# Patient Record
Sex: Female | Born: 1955 | Race: White | Hispanic: No | Marital: Married | Smoking: Never smoker
Health system: Southern US, Community
[De-identification: ages and names within clinical notes are randomized; demographics above are authoritative.]

## PROBLEM LIST (undated history)

## (undated) DIAGNOSIS — N8110 Cystocele, unspecified: Secondary | ICD-10-CM

## (undated) DIAGNOSIS — E079 Disorder of thyroid, unspecified: Secondary | ICD-10-CM

## (undated) DIAGNOSIS — K449 Diaphragmatic hernia without obstruction or gangrene: Secondary | ICD-10-CM

## (undated) DIAGNOSIS — K219 Gastro-esophageal reflux disease without esophagitis: Secondary | ICD-10-CM

## (undated) DIAGNOSIS — I10 Essential (primary) hypertension: Secondary | ICD-10-CM

## (undated) DIAGNOSIS — J309 Allergic rhinitis, unspecified: Secondary | ICD-10-CM

## (undated) DIAGNOSIS — E785 Hyperlipidemia, unspecified: Secondary | ICD-10-CM

## (undated) HISTORY — DX: Essential (primary) hypertension: I10

## (undated) HISTORY — DX: Hyperlipidemia, unspecified: E78.5

## (undated) HISTORY — DX: Diaphragmatic hernia without obstruction or gangrene: K44.9

## (undated) HISTORY — DX: Cystocele, unspecified: N81.10

## (undated) HISTORY — DX: Disorder of thyroid, unspecified: E07.9

## (undated) HISTORY — DX: Allergic rhinitis, unspecified: J30.9

## (undated) HISTORY — DX: Gastro-esophageal reflux disease without esophagitis: K21.9

## (undated) HISTORY — PX: VEIN LIGATION AND STRIPPING: SHX2653

## (undated) HISTORY — PX: BREAST BIOPSY: SHX20

---

## 1998-10-26 HISTORY — PX: ABDOMINAL HYSTERECTOMY: SHX81

## 2010-03-18 ENCOUNTER — Ambulatory Visit: Payer: Self-pay | Admitting: Gastroenterology

## 2016-04-22 ENCOUNTER — Encounter: Payer: Self-pay | Admitting: Obstetrics and Gynecology

## 2016-04-22 ENCOUNTER — Ambulatory Visit (INDEPENDENT_AMBULATORY_CARE_PROVIDER_SITE_OTHER): Payer: BLUE CROSS/BLUE SHIELD | Admitting: Obstetrics and Gynecology

## 2016-04-22 VITALS — BP 145/75 | HR 99 | Ht 64.5 in | Wt 212.0 lb

## 2016-04-22 DIAGNOSIS — T8389XA Other specified complication of genitourinary prosthetic devices, implants and grafts, initial encounter: Secondary | ICD-10-CM

## 2016-04-22 DIAGNOSIS — N898 Other specified noninflammatory disorders of vagina: Secondary | ICD-10-CM | POA: Insufficient documentation

## 2016-04-22 DIAGNOSIS — Z515 Encounter for palliative care: Secondary | ICD-10-CM | POA: Insufficient documentation

## 2016-04-22 DIAGNOSIS — N393 Stress incontinence (female) (male): Secondary | ICD-10-CM | POA: Diagnosis not present

## 2016-04-22 DIAGNOSIS — N8110 Cystocele, unspecified: Secondary | ICD-10-CM | POA: Diagnosis not present

## 2016-04-22 DIAGNOSIS — IMO0002 Reserved for concepts with insufficient information to code with codable children: Secondary | ICD-10-CM | POA: Insufficient documentation

## 2016-04-22 DIAGNOSIS — Z4689 Encounter for fitting and adjustment of other specified devices: Secondary | ICD-10-CM | POA: Insufficient documentation

## 2016-04-22 MED ORDER — ESTROGENS, CONJUGATED 0.625 MG/GM VA CREA: 0.5000 | TOPICAL_CREAM | Freq: Every day | VAGINAL | Status: DC

## 2016-04-22 NOTE — Progress Notes (Signed)
Chief complaint: 1. Symptomatic cystocele 2. Pessary maintenance 3. [facility]ow  Patient is a 60 year old married white female para 1001, status post a TAH/BSO by Dr. Zenaida NieceVan daily and in 2000, currently with symptomatic pelvic organ prolapse with cystocele and mild stress urinary incontinence, managed by a #4 ring pessary, presents for transfer of care and pessary maintenance.  Patient reports occasional vaginal spotting/discharge. No pelvic pain.  Without pessary patient notes pelvic pressure and occasional stress urinary incontinence. Bowel function is normal. Patient has been getting pessary check every 3 months.  Past Medical History  Diagnosis Date  . AR (allergic rhinitis)   . GERD (gastroesophageal reflux disease)   . Hiatal hernia   . Hypertension   . Hyperlipemia   . Thyroid disease   . Vaginal prolapse    Past Surgical History  Procedure Laterality Date  . Abdominal hysterectomy  2000    [place] bso- bvd  . Vein ligation and stripping    . Breast biopsy      Review of systems: Per history of present illness  OBJECTIVE: BP 145/75 mmHg  Pulse 99  Ht 5' 4.5" (1.638 m)  Wt 212 lb (96.163 kg)  BMI 35.84 kg/m2 Pleasant white female in no acute distress Abdomen: Soft, nontender Pelvic exam: External genitalia normal BUS normal Vagina-second-degree cystocele; mild atrophy; 3 x 2 cm posterior vaginal erosion noted near the vaginal apex with minimal granulation tissue present; secretions malodorous-slight blood-tinged Cervix-surgically absent Uterus surgically absent Adnexa-nonpalpable and nontender Rectovaginal-normal external exam  PROCEDURE: Pessary maintenance Pessary is removed and has not reinserted-#4 ring  ASSESSMENT: 1. Cystocele 2. Mild SUI 3. Status post TAH/BSO 4. Pessary maintenance-routine 5. Posterior vaginal erosion 3 x 2 cm  PLAN: 1. Premarin cream 1/2 g intravaginal nightly for 2 weeks 2. Return in 3 weeks for  reassessment 3. Annual exam to be scheduled for January 2018  A total of 25 minutes were spent face-to-face with the patient during this encounter and over half of that time involved counseling and coordination of care.  Herold HarmsMartin A Sollie Vultaggio, MD  Note: This dictation was prepared with Dragon dictation along with smaller phrase technology. Any transcriptional errors that result from this process are unintentional. Posterior vaginal

## 2016-04-22 NOTE — Patient Instructions (Addendum)
1. Premarin cream 1/2 g intravaginal every night for 2 weeks 2. Return in 3 weeks for reassessment of vaginal erosion 3.  Annual physical exam is scheduled for January 2018

## 2016-05-05 ENCOUNTER — Encounter: Payer: Self-pay | Admitting: Unknown Physician Specialty

## 2016-05-13 ENCOUNTER — Ambulatory Visit (INDEPENDENT_AMBULATORY_CARE_PROVIDER_SITE_OTHER): Payer: BLUE CROSS/BLUE SHIELD | Admitting: Obstetrics and Gynecology

## 2016-05-13 ENCOUNTER — Encounter: Payer: Self-pay | Admitting: Obstetrics and Gynecology

## 2016-05-13 VITALS — BP 158/75 | HR 60 | Ht 64.5 in | Wt 209.9 lb

## 2016-05-13 DIAGNOSIS — N8110 Cystocele, unspecified: Secondary | ICD-10-CM | POA: Diagnosis not present

## 2016-05-13 DIAGNOSIS — N898 Other specified noninflammatory disorders of vagina: Secondary | ICD-10-CM | POA: Diagnosis not present

## 2016-05-13 DIAGNOSIS — IMO0002 Reserved for concepts with insufficient information to code with codable children: Secondary | ICD-10-CM

## 2016-05-13 NOTE — Progress Notes (Signed)
Chief complaint: 1. Cystocele 2. Vaginal erosion  Patient presents for follow-up after removal of pessary for vaginal rest in order to allow vaginal erosion to heal. Patient has been using Premarin cream intravaginal every night. She does not report any vaginal discharge or bleeding  OBJECTIVE: BP 158/75 mmHg  Pulse 60  Ht 5' 4.5" (1.638 m)  Wt 209 lb 14.4 oz (95.21 kg)  BMI 35.49 kg/m2 Pleasant female in no acute distress Abdomen: Soft, nontender Pelvic exam: External genitalia normal BUS normal Vagina-second-degree cystocele; mild atrophy; 3 x 2 cm posterior vaginal erosion noted near the vaginal apex with minimal granulation tissue present; secretions malodorous-slight blood-tinged; a second area of linear vaginal erosion superior to the first area of granulation is present Cervix-surgically absent Uterus surgically absent Adnexa-nonpalpable and nontender Rectovaginal-normal external exam  PROCEDURE: Vaginal biopsy Consent is obtained. Multiple Tischler forcep biopsies are used to remove the 3 x 2 cm posterior vaginal erosion with granulation tissue; Monsel solution is applied to help with healing process. The linear granulation tissue superior to the excised granulation tissue is also cauterized with Monsel solution. Minimal bleeding is encountered. Procedure was well-tolerated.  ASSESSMENT: 1. Vaginal granulation tissue/erosion-persistent 2. Granulation tissue excised today  PLAN: 1. Excisional biopsy of granulation tissue-see note 2. Continue Premarin cream intravaginal every night to every other night as tolerated 3. Return in 4 weeks for follow-up  A total of 15 minutes were spent face-to-face with the patient during this encounter and over half of that time dealt with counseling and coordination of care.  Herold HarmsMartin A Jalynn Betzold, MD  Note: This dictation was prepared with Dragon dictation along with smaller phrase technology. Any transcriptional errors that result from  this process are unintentional.

## 2016-05-13 NOTE — Patient Instructions (Signed)
1. Granulation tissue was excised today 2. Continue with Premarin cream intravaginal every night to every other night as tolerated 3. Return in 4 weeks for follow-up

## 2016-05-15 LAB — PATHOLOGY

## 2016-06-09 ENCOUNTER — Ambulatory Visit (INDEPENDENT_AMBULATORY_CARE_PROVIDER_SITE_OTHER): Payer: BLUE CROSS/BLUE SHIELD | Admitting: Obstetrics and Gynecology

## 2016-06-09 ENCOUNTER — Encounter: Payer: Self-pay | Admitting: Obstetrics and Gynecology

## 2016-06-09 VITALS — BP 118/70 | HR 52 | Ht 64.0 in | Wt 205.3 lb

## 2016-06-09 DIAGNOSIS — N8110 Cystocele, unspecified: Secondary | ICD-10-CM

## 2016-06-09 DIAGNOSIS — N898 Other specified noninflammatory disorders of vagina: Secondary | ICD-10-CM

## 2016-06-09 DIAGNOSIS — IMO0002 Reserved for concepts with insufficient information to code with codable children: Secondary | ICD-10-CM

## 2016-06-09 NOTE — Progress Notes (Signed)
Chief complaint: 1. Cystocele 2. Vaginal erosion  Patient presents for four-week follow-up. She has Pessary out over the past month and is doing well. She has not noted the urinary dribbling that previously was a problem. She desires to keep the pessary out at this time. Meredith Cooper does not notice any vaginal bleeding or discharge present.  Pathology:Diagnosis:  VAGINA EROSION BIOPSY, POSTERIOR, NEAR APEX:  ACUTE VAGINITIS WITH ULCERATION AND GRANULATION TISSUE. NEGATIVE FOR  DYSPLASIA, VIRAL EFFECT, AND MALIGNANCY.  SOL/05/15/2016   Past medical history, past surgical history, problem list, medications, and allergies are reviewed  OBJECTIVE: BP 118/70   Pulse (!) 52   Ht 5\' 4"  (1.626 m)   Wt 205 lb 4.8 oz (93.1 kg)   BMI 35.24 kg/m  Pleasant female in acute distress Speculum exam: Vagina with good estrogen effect; second-degree cystocele present; posterior vaginal erosion 2 x 3 cm, persists without evidence of acute inflammation, induration or discharge; friability persists.  ASSESSMENT: 1. Cystocele with pessary complication of vaginal erosion, slowly healing; biopsy benign 2. Patient desires to leave pessary out  PLAN: 1. Continue with estrogen cream intravaginal every other day for 3 months. 2. Return in 3 months for follow-up or as needed if desired for pessary reinsertion develops.  A total of 15 minutes were spent face-to-face with the patient during this encounter and over half of that time dealt with counseling and coordination of care.  Herold HarmsMartin A Defrancesco, MD  Note: This dictation was prepared with Dragon dictation along with smaller phrase technology. Any transcriptional errors that result from this process are unintentional.

## 2016-06-09 NOTE — Patient Instructions (Signed)
1. Continue with estrogen cream intravaginal 2-3 times a week 2. Return in 3 months for follow-up 3. Leave pessary out at this time per patient request

## 2016-09-08 ENCOUNTER — Encounter: Payer: Self-pay | Admitting: Obstetrics and Gynecology

## 2016-09-08 ENCOUNTER — Ambulatory Visit (INDEPENDENT_AMBULATORY_CARE_PROVIDER_SITE_OTHER): Payer: BLUE CROSS/BLUE SHIELD | Admitting: Obstetrics and Gynecology

## 2016-09-08 VITALS — BP 147/79 | HR 55 | Ht 64.0 in | Wt 210.2 lb

## 2016-09-08 DIAGNOSIS — N898 Other specified noninflammatory disorders of vagina: Secondary | ICD-10-CM | POA: Diagnosis not present

## 2016-09-08 DIAGNOSIS — N8111 Cystocele, midline: Secondary | ICD-10-CM | POA: Diagnosis not present

## 2016-09-08 DIAGNOSIS — Z4689 Encounter for fitting and adjustment of other specified devices: Secondary | ICD-10-CM

## 2016-09-08 NOTE — Progress Notes (Signed)
Chief complaint: 1. Vaginal erosion 2. Cystocele  Meredith DandyMary presents today for 3 month follow-up on vaginal erosion. She has Her ring pessary with support out of the vagina for the past 3 months. She has been using Premarin cream intravaginal every other day. She has noted resolution of her spotting and discharge from the vagina.  OBJECTIVE: BP (!) 147/79   Pulse (!) 55   Ht 5\' 4"  (1.626 m)   Wt 210 lb 3.2 oz (95.3 kg)   BMI 36.08 kg/m  Pleasant female in no acute distress. Abdomen: Soft, nontender Pelvic exam: Speculum exam: Vagina with good estrogen effect; second-degree cystocele present; posterior vaginal erosion 2 x 3 cm, resolved  Ring pessary with support is inserted today.  ASSESSMENT: 1. Vaginal erosion, resolved  PLAN: 1. Pessary inserted 2. Return in January 2018. Follow-up 3. Continue with Premarin cream intravaginal twice a week  A total of 15 minutes were spent face-to-face with the patient during this encounter and over half of that time dealt with counseling and coordination of care.  Herold HarmsMartin A Tuere Nwosu, MD  Note: This dictation was prepared with Dragon dictation along with smaller phrase technology. Any transcriptional errors that result from this process are unintentional.

## 2016-09-08 NOTE — Patient Instructions (Addendum)
Return in January 2018 for pessary maintenance and gynecologic physical Ring with support pessary is inserted today Premarin cream intravaginal twice a week to be continued

## 2016-11-10 NOTE — Progress Notes (Deleted)
ANNUAL PREVENTATIVE CARE GYN  ENCOUNTER NOTE  Subjective:       Meredith Cooper is a 61 y.o. G12P1001 female here for a routine annual gynecologic exam.  Current complaints: 1.  Pessary maintenance- ring with support - LV 08/2016   Gynecologic History No LMP recorded. Patient has had a hysterectomy. Contraception: status post hysterectomy Last Pap: . Results were: normal Last mammogram: . Results were: normal  Obstetric History OB History  Gravida Para Term Preterm AB Living  1 1 1     1   SAB TAB Ectopic Multiple Live Births          1    # Outcome Date GA Lbr Len/2nd Weight Sex Delivery Anes PTL Lv  1 Term 1973   7 lb 11.2 oz (3.493 kg) F VBAC   LIV      Past Medical History:  Diagnosis Date  . AR (allergic rhinitis)   . GERD (gastroesophageal reflux disease)   . Hiatal hernia   . Hyperlipemia   . Hypertension   . Thyroid disease   . Vaginal prolapse     Past Surgical History:  Procedure Laterality Date  . ABDOMINAL HYSTERECTOMY  2000   [place] bso- bvd  . BREAST BIOPSY    . VEIN LIGATION AND STRIPPING      Current Outpatient Prescriptions on File Prior to Visit  Medication Sig Dispense Refill  . azelastine (ASTELIN) 0.1 % nasal spray Place into both nostrils 2 (two) times daily. Use in each nostril as directed    . conjugated estrogens (PREMARIN) vaginal cream Place 0.5 Applicatorfuls vaginally at bedtime. For 30 days 42.5 g 12  . estradiol (ESTRACE) 0.5 MG tablet Take 0.5 mg by mouth daily.    . fluticasone (FLONASE) 50 MCG/ACT nasal spray Place into both nostrils daily.    . furosemide (LASIX) 40 MG tablet Take 40 mg by mouth.    . levocetirizine (XYZAL) 5 MG tablet Take 5 mg by mouth every evening.    Marland Kitchen levothyroxine (SYNTHROID, LEVOTHROID) 125 MCG tablet Take 125 mcg by mouth daily before breakfast. 1/2 pill on Sunday    . lisinopril (PRINIVIL,ZESTRIL) 5 MG tablet Take 5 mg by mouth daily.    . montelukast (SINGULAIR) 10 MG tablet Take 10 mg by mouth at bedtime.     . pravastatin (PRAVACHOL) 20 MG tablet Take 20 mg by mouth daily.     No current facility-administered medications on file prior to visit.     Allergies  Allergen Reactions  . Penicillins Itching and Rash    Social History   Social History  . Marital status: Married    Spouse name: N/A  . Number of children: N/A  . Years of education: N/A   Occupational History  . Not on file.   Social History Main Topics  . Smoking status: Never Smoker  . Smokeless tobacco: Never Used  . Alcohol use No  . Drug use: No  . Sexual activity: Yes    Birth control/ protection: Surgical   Other Topics Concern  . Not on file   Social History Narrative  . No narrative on file    Family History  Problem Relation Age of Onset  . Bone cancer Mother   . Bladder Cancer Sister   . Colon cancer Sister   . Diabetes Brother   . Ovarian cancer Maternal Grandmother   . Heart disease Neg Hx     The following portions of the patient's history were reviewed and  updated as appropriate: allergies, current medications, past family history, past medical history, past social history, past surgical history and problem list.  Review of Systems ROS Review of Systems - General ROS: negative for - chills, fatigue, fever, hot flashes, night sweats, weight gain or weight loss Psychological ROS: negative for - anxiety, decreased libido, depression, mood swings, physical abuse or sexual abuse Ophthalmic ROS: negative for - blurry vision, eye pain or loss of vision ENT ROS: negative for - headaches, hearing change, visual changes or vocal changes Allergy and Immunology ROS: negative for - hives, itchy/watery eyes or seasonal allergies Hematological and Lymphatic ROS: negative for - bleeding problems, bruising, swollen lymph nodes or weight loss Endocrine ROS: negative for - galactorrhea, hair pattern changes, hot flashes, malaise/lethargy, mood swings, palpitations, polydipsia/polyuria, skin changes, temperature  intolerance or unexpected weight changes Breast ROS: negative for - new or changing breast lumps or nipple discharge Respiratory ROS: negative for - cough or shortness of breath Cardiovascular ROS: negative for - chest pain, irregular heartbeat, palpitations or shortness of breath Gastrointestinal ROS: no abdominal pain, change in bowel habits, or black or bloody stools Genito-Urinary ROS: no dysuria, trouble voiding, or hematuria Musculoskeletal ROS: negative for - joint pain or joint stiffness Neurological ROS: negative for - bowel and bladder control changes Dermatological ROS: negative for rash and skin lesion changes   Objective:   There were no vitals taken for this visit. CONSTITUTIONAL: Well-developed, well-nourished female in no acute distress.  PSYCHIATRIC: Normal mood and affect. Normal behavior. Normal judgment and thought content. NEUROLGIC: Alert and oriented to person, place, and time. Normal muscle tone coordination. No cranial nerve deficit noted. HENT:  Normocephalic, atraumatic, External right and left ear normal. Oropharynx is clear and moist EYES: Conjunctivae and EOM are normal. Pupils are equal, round, and reactive to light. No scleral icterus.  NECK: Normal range of motion, supple, no masses.  Normal thyroid.  SKIN: Skin is warm and dry. No rash noted. Not diaphoretic. No erythema. No pallor. CARDIOVASCULAR: Normal heart rate noted, regular rhythm, no murmur. RESPIRATORY: Clear to auscultation bilaterally. Effort and breath sounds normal, no problems with respiration noted. BREASTS: Symmetric in size. No masses, skin changes, nipple drainage, or lymphadenopathy. ABDOMEN: Soft, normal bowel sounds, no distention noted.  No tenderness, rebound or guarding.  BLADDER: Normal PELVIC:  External Genitalia: Normal  BUS: Normal  Vagina: Normal  Cervix: Normal  Uterus: Normal  Adnexa: Normal  RV: {Blank multiple:19196::"External Exam NormaI","No Rectal Masses","Normal  Sphincter tone"}  MUSCULOSKELETAL: Normal range of motion. No tenderness.  No cyanosis, clubbing, or edema.  2+ distal pulses. LYMPHATIC: No Axillary, Supraclavicular, or Inguinal Adenopathy.    Assessment:   Annual gynecologic examination 61 y.o. Contraception: status post hysterectomy bmi- Problem List Items Addressed This Visit    None      Plan:  Pap: Not needed Mammogram: Ordered Stool Guaiac Testing:  Ordered Labs: thru pcp Routine preventative health maintenance measures emphasized: {Blank multiple:19196::"Exercise/Diet/Weight control","Tobacco Warnings","Alcohol/Substance use risks","Stress Management","Peer Pressure Issues","Safe Sex"} *** Return to Clinic - 1 Year   SunGardCrystal Addysin Por[place], [place]MA

## 2016-11-18 ENCOUNTER — Encounter: Payer: BLUE CROSS/BLUE SHIELD | Admitting: Obstetrics and Gynecology

## 2016-12-07 NOTE — Progress Notes (Signed)
ANNUAL PREVENTATIVE CARE GYN  ENCOUNTER NOTE  Subjective:       Meredith Cooper is a 61 y.o. G32P1001 female here for a routine annual gynecologic exam.  Current complaints: 1.  Pessary maintenance- ring with support - LV 08/2016     Gynecologic History No LMP recorded. Patient has had a hysterectomy. Contraception: status post hysterectomy Last Pap: unknown  Results were: normal Last mammogram: 2018  Results were: normal  Obstetric History OB History  Gravida Para Term Preterm AB Living  1 1 1     1   SAB TAB Ectopic Multiple Live Births          1    # Outcome Date GA Lbr Len/2nd Weight Sex Delivery Anes PTL Lv  1 Term 1973   7 lb 11.2 oz (3.493 kg) F VBAC   LIV      Past Medical History:  Diagnosis Date  . AR (allergic rhinitis)   . GERD (gastroesophageal reflux disease)   . Hiatal hernia   . Hyperlipemia   . Hypertension   . Thyroid disease   . Vaginal prolapse     Past Surgical History:  Procedure Laterality Date  . ABDOMINAL HYSTERECTOMY  2000   [place] bso- bvd  . BREAST BIOPSY    . VEIN LIGATION AND STRIPPING      Current Outpatient Prescriptions on File Prior to Visit  Medication Sig Dispense Refill  . azelastine (ASTELIN) 0.1 % nasal spray Place into both nostrils 2 (two) times daily. Use in each nostril as directed    . conjugated estrogens (PREMARIN) vaginal cream Place 0.5 Applicatorfuls vaginally at bedtime. For 30 days 42.5 g 12  . estradiol (ESTRACE) 0.5 MG tablet Take 0.5 mg by mouth daily.    . fluticasone (FLONASE) 50 MCG/ACT nasal spray Place into both nostrils daily.    . furosemide (LASIX) 40 MG tablet Take 40 mg by mouth.    . levocetirizine (XYZAL) 5 MG tablet Take 5 mg by mouth every evening.    Marland Kitchen levothyroxine (SYNTHROID, LEVOTHROID) 125 MCG tablet Take 125 mcg by mouth daily before breakfast. 1/2 pill on Sunday    . lisinopril (PRINIVIL,ZESTRIL) 5 MG tablet Take 5 mg by mouth daily.    . montelukast (SINGULAIR) 10 MG tablet Take 10 mg by mouth  at bedtime.    . pravastatin (PRAVACHOL) 20 MG tablet Take 20 mg by mouth daily.     No current facility-administered medications on file prior to visit.     Allergies  Allergen Reactions  . Penicillins Itching and Rash    Social History   Social History  . Marital status: Married    Spouse name: N/A  . Number of children: N/A  . Years of education: N/A   Occupational History  . Not on file.   Social History Main Topics  . Smoking status: Never Smoker  . Smokeless tobacco: Never Used  . Alcohol use No  . Drug use: No  . Sexual activity: Yes    Birth control/ protection: Surgical   Other Topics Concern  . Not on file   Social History Narrative  . No narrative on file    Family History  Problem Relation Age of Onset  . Bone cancer Mother   . Bladder Cancer Sister   . Colon cancer Sister   . Diabetes Brother   . Ovarian cancer Maternal Grandmother   . Heart disease Neg Hx     The following portions of the patient's  history were reviewed and updated as appropriate: allergies, current medications, past family history, past medical history, past social history, past surgical history and problem list.  Review of Systems ROS Review of Systems - General ROS: negative for - chills, fatigue, fever, hot flashes, night sweats, weight gain or weight loss Psychological ROS: negative for - anxiety, decreased libido, depression, mood swings, physical abuse or sexual abuse Ophthalmic ROS: negative for - blurry vision, eye pain or loss of vision ENT ROS: negative for - headaches, hearing change, visual changes or vocal changes Allergy and Immunology ROS: negative for - hives, itchy/watery eyes or seasonal allergies Hematological and Lymphatic ROS: negative for - bleeding problems, bruising, swollen lymph nodes or weight loss Endocrine ROS: negative for - galactorrhea, hair pattern changes, hot flashes, malaise/lethargy, mood swings, palpitations, polydipsia/polyuria, skin  changes, temperature intolerance or unexpected weight changes Breast ROS: negative for - new or changing breast lumps or nipple discharge Respiratory ROS: negative for - cough or shortness of breath Cardiovascular ROS: negative for - chest pain, irregular heartbeat, palpitations or shortness of breath Gastrointestinal ROS: no abdominal pain, change in bowel habits, or black or bloody stools Genito-Urinary ROS: Mild SUI symptoms, stable; occasional odor; no vaginal bleeding or discharge Musculoskeletal ROS: negative for - joint pain or joint stiffness Neurological ROS: negative for - bowel and bladder control changes Dermatological ROS: negative for rash and skin lesion changes   Objective:   BP (!) 156/75   Pulse 60   Ht 5\' 4"  (1.626 m)   Wt 211 lb 8 oz (95.9 kg)   BMI 36.30 kg/m   CONSTITUTIONAL: Well-developed, well-nourished female in no acute distress.  PSYCHIATRIC: Normal mood and affect. Normal behavior. Normal judgment and thought content. NEUROLGIC: Alert and oriented to person, place, and time. Normal muscle tone coordination. No cranial nerve deficit noted. HENT:  Normocephalic, atraumatic, External right and left ear normal.  EYES: Conjunctivae and EOM are normal. No scleral icterus.  NECK: Normal range of motion, supple, no masses.  Normal thyroid.  SKIN: Skin is warm and dry. No rash noted. Not diaphoretic. No erythema. No pallor. CARDIOVASCULAR: Normal heart rate noted, regular rhythm, 1/6 systolic ejection murmur; no S3 or S4 RESPIRATORY: Clear to auscultation bilaterally. Effort and breath sounds normal, no problems with respiration noted. BREASTS: Symmetric in size, pendulous. No masses, skin changes, nipple drainage, or lymphadenopathy. ABDOMEN: Soft, normal bowel sounds, no distention noted.  No tenderness, rebound or guarding. No hernias BLADDER: Normal PELVIC:  External Genitalia: Normal  BUS: Normal  Vagina: Mild atrophy; second-degree cystocele; posterior  vaginal erosion 2 x 3 cm present with minimal friability  Cervix: Surgically absent  Uterus: Surgically absent  Adnexa: Normal; nonpalpable nontender  RV: External Exam NormaI, No Rectal Masses and Normal Sphincter tone  MUSCULOSKELETAL: Normal range of motion. No tenderness.  No cyanosis, clubbing, or edema.  2+ distal pulses. LYMPHATIC: No Axillary, Supraclavicular, or Inguinal Adenopathy.  PROCEDURE: Pessary is removed and cleaned. It is not inserted because of posterior vaginal erosion  Assessment:   Annual gynecologic examination 61 y.o. Contraception: status post hysterectomy [id number] Second-degree cystocele Posterior vaginal erosion Stress urinary incontinence, stable   Plan:  Pap: Not needed Mammogram: utd Stool Guaiac Testing:  Ordered Labs: thru pcp Routine preventative health maintenance measures emphasized: Exercise/Diet/Weight control, Tobacco Warnings, Alcohol/Substance use risks, Peer Pressure Issues and Safe Sex Leave pessary out for 1 month Premarin cream intravaginal twice a week Return in 1 month for pessary maintenance Return to Clinic - 1 Year  Joyice Faster, CMA  Brayton Mars, MD  Note: This dictation was prepared with Dragon dictation along with smaller phrase technology. Any transcriptional errors that result from this process are unintentional.

## 2016-12-09 ENCOUNTER — Ambulatory Visit (INDEPENDENT_AMBULATORY_CARE_PROVIDER_SITE_OTHER): Payer: BLUE CROSS/BLUE SHIELD | Admitting: Obstetrics and Gynecology

## 2016-12-09 ENCOUNTER — Encounter: Payer: Self-pay | Admitting: Obstetrics and Gynecology

## 2016-12-09 VITALS — BP 156/75 | HR 60 | Ht 64.0 in | Wt 211.5 lb

## 2016-12-09 DIAGNOSIS — N393 Stress incontinence (female) (male): Secondary | ICD-10-CM | POA: Diagnosis not present

## 2016-12-09 DIAGNOSIS — E6609 Other obesity due to excess calories: Secondary | ICD-10-CM | POA: Diagnosis not present

## 2016-12-09 DIAGNOSIS — Z01419 Encounter for gynecological examination (general) (routine) without abnormal findings: Secondary | ICD-10-CM

## 2016-12-09 DIAGNOSIS — N8111 Cystocele, midline: Secondary | ICD-10-CM | POA: Diagnosis not present

## 2016-12-09 DIAGNOSIS — Z1211 Encounter for screening for malignant neoplasm of colon: Secondary | ICD-10-CM | POA: Diagnosis not present

## 2016-12-09 DIAGNOSIS — Z90710 Acquired absence of both cervix and uterus: Secondary | ICD-10-CM

## 2016-12-09 DIAGNOSIS — Z6836 Body mass index (BMI) 36.0-36.9, adult: Secondary | ICD-10-CM

## 2016-12-09 DIAGNOSIS — N898 Other specified noninflammatory disorders of vagina: Secondary | ICD-10-CM

## 2016-12-09 MED ORDER — ESTRADIOL 0.5 MG PO TABS: 0.5000 mg | ORAL_TABLET | Freq: Every day | ORAL | 3 refills | Status: DC

## 2016-12-09 NOTE — Patient Instructions (Addendum)
1. No Pap smear needed 2. Mammogram already obtained 3. Stool guaiac cards for colon cancer screening are given 4. Screening labs will be obtained through primary care provider 5. Continue with healthy eating and exercise with control weight loss 6. Recommend calcium with vitamin D supplementation daily 7. Return in 1 year for annual exam 8. Return in 1 months for pessary maintenance; pessary is left out at this time 9. Continue with Premarin cream intravaginal twice a week   Health Maintenance for Postmenopausal Women Introduction Menopause is a normal process in which your reproductive ability comes to an end. This process happens gradually over a span of months to years, usually between the ages of 31 and 17. Menopause is complete when you have missed 12 consecutive menstrual periods. It is important to talk with your health care provider about some of the most common conditions that affect postmenopausal women, such as heart disease, cancer, and bone loss (osteoporosis). Adopting a healthy lifestyle and getting preventive care can help to promote your health and wellness. Those actions can also lower your chances of developing some of these common conditions. What should I know about menopause? During menopause, you may experience a number of symptoms, such as:  Moderate-to-severe hot flashes.  Night sweats.  Decrease in sex drive.  Mood swings.  Headaches.  Tiredness.  Irritability.  Memory problems.  Insomnia. Choosing to treat or not to treat menopausal changes is an individual decision that you make with your health care provider. What should I know about hormone replacement therapy and supplements? Hormone therapy products are effective for treating symptoms that are associated with menopause, such as hot flashes and night sweats. Hormone replacement carries certain risks, especially as you become older. If you are thinking about using estrogen or estrogen with progestin  treatments, discuss the benefits and risks with your health care provider. What should I know about heart disease and stroke? Heart disease, heart attack, and stroke become more likely as you age. This may be due, in part, to the hormonal changes that your body experiences during menopause. These can affect how your body processes dietary fats, triglycerides, and cholesterol. Heart attack and stroke are both medical emergencies. There are many things that you can do to help prevent heart disease and stroke:  Have your blood pressure checked at least every 1-2 years. High blood pressure causes heart disease and increases the risk of stroke.  If you are 34-63 years old, ask your health care provider if you should take aspirin to prevent a heart attack or a stroke.  Do not use any tobacco products, including cigarettes, chewing tobacco, or electronic cigarettes. If you need help quitting, ask your health care provider.  It is important to eat a healthy diet and maintain a healthy weight.  Be sure to include plenty of vegetables, fruits, low-fat dairy products, and lean protein.  Avoid eating foods that are high in solid fats, added sugars, or salt (sodium).  Get regular exercise. This is one of the most important things that you can do for your health.  Try to exercise for at least 150 minutes each week. The type of exercise that you do should increase your heart rate and make you sweat. This is known as moderate-intensity exercise.  Try to do strengthening exercises at least twice each week. Do these in addition to the moderate-intensity exercise.  Know your numbers.Ask your health care provider to check your cholesterol and your blood glucose. Continue to have your blood tested  as directed by your health care provider. What should I know about cancer screening? There are several types of cancer. Take the following steps to reduce your risk and to catch any cancer development as early as  possible. Breast Cancer  Practice breast self-awareness.  This means understanding how your breasts normally appear and feel.  It also means doing regular breast self-exams. Let your health care provider know about any changes, no matter how small.  If you are 44 or older, have a clinician do a breast exam (clinical breast exam or CBE) every year. Depending on your age, family history, and medical history, it may be recommended that you also have a yearly breast X-ray (mammogram).  If you have a family history of breast cancer, talk with your health care provider about genetic screening.  If you are at high risk for breast cancer, talk with your health care provider about having an MRI and a mammogram every year.  Breast cancer (BRCA) gene test is recommended for women who have family members with BRCA-related cancers. Results of the assessment will determine the need for genetic counseling and BRCA1 and for BRCA2 testing. BRCA-related cancers include these types:  Breast. This occurs in males or females.  Ovarian.  Tubal. This may also be called fallopian tube cancer.  Cancer of the abdominal or pelvic lining (peritoneal cancer).  Prostate.  Pancreatic. Cervical, Uterine, and Ovarian Cancer  Your health care provider may recommend that you be screened regularly for cancer of the pelvic organs. These include your ovaries, uterus, and vagina. This screening involves a pelvic exam, which includes checking for microscopic changes to the surface of your cervix (Pap test).  For women ages 21-65, health care providers may recommend a pelvic exam and a Pap test every three years. For women ages 5-65, they may recommend the Pap test and pelvic exam, combined with testing for human papilloma virus (HPV), every five years. Some types of HPV increase your risk of cervical cancer. Testing for HPV may also be done on women of any age who have unclear Pap test results.  Other health care  providers may not recommend any screening for nonpregnant women who are considered low risk for pelvic cancer and have no symptoms. Ask your health care provider if a screening pelvic exam is right for you.  If you have had past treatment for cervical cancer or a condition that could lead to cancer, you need Pap tests and screening for cancer for at least 20 years after your treatment. If Pap tests have been discontinued for you, your risk factors (such as having a new sexual partner) need to be reassessed to determine if you should start having screenings again. Some women have medical problems that increase the chance of getting cervical cancer. In these cases, your health care provider may recommend that you have screening and Pap tests more often.  If you have a family history of uterine cancer or ovarian cancer, talk with your health care provider about genetic screening.  If you have vaginal bleeding after reaching menopause, tell your health care provider.  There are currently no reliable tests available to screen for ovarian cancer. Lung Cancer  Lung cancer screening is recommended for adults 34-72 years old who are at high risk for lung cancer because of a history of smoking. A yearly low-dose CT scan of the lungs is recommended if you:  Currently smoke.  Have a history of at least 30 pack-years of smoking and you currently  smoke or have quit within the past 15 years. A pack-year is smoking an average of one pack of cigarettes per day for one year. Yearly screening should:  Continue until it has been 15 years since you quit.  Stop if you develop a health problem that would prevent you from having lung cancer treatment. Colorectal Cancer  This type of cancer can be detected and can often be prevented.  Routine colorectal cancer screening usually begins at age 17 and continues through age 1.  If you have risk factors for colon cancer, your health care provider may recommend that you  be screened at an earlier age.  If you have a family history of colorectal cancer, talk with your health care provider about genetic screening.  Your health care provider may also recommend using home test kits to check for hidden blood in your stool.  A small camera at the end of a tube can be used to examine your colon directly (sigmoidoscopy or colonoscopy). This is done to check for the earliest forms of colorectal cancer.  Direct examination of the colon should be repeated every 5-10 years until age 27. However, if early forms of precancerous polyps or small growths are found or if you have a family history or genetic risk for colorectal cancer, you may need to be screened more often. Skin Cancer  Check your skin from head to toe regularly.  Monitor any moles. Be sure to tell your health care provider:  About any new moles or changes in moles, especially if there is a change in a mole's shape or color.  If you have a mole that is larger than the size of a pencil eraser.  If any of your family members has a history of skin cancer, especially at a young age, talk with your health care provider about genetic screening.  Always use sunscreen. Apply sunscreen liberally and repeatedly throughout the day.  Whenever you are outside, protect yourself by wearing long sleeves, pants, a wide-brimmed hat, and sunglasses. What should I know about osteoporosis? Osteoporosis is a condition in which bone destruction happens more quickly than new bone creation. After menopause, you may be at an increased risk for osteoporosis. To help prevent osteoporosis or the bone fractures that can happen because of osteoporosis, the following is recommended:  If you are 107-41 years old, get at least 1,000 mg of calcium and at least 600 mg of vitamin D per day.  If you are older than age 36 but younger than age 20, get at least 1,200 mg of calcium and at least 600 mg of vitamin D per day.  If you are older than  age 15, get at least 1,200 mg of calcium and at least 800 mg of vitamin D per day. Smoking and excessive alcohol intake increase the risk of osteoporosis. Eat foods that are rich in calcium and vitamin D, and do weight-bearing exercises several times each week as directed by your health care provider. What should I know about how menopause affects my mental health? Depression may occur at any age, but it is more common as you become older. Common symptoms of depression include:  Low or sad mood.  Changes in sleep patterns.  Changes in appetite or eating patterns.  Feeling an overall lack of motivation or enjoyment of activities that you previously enjoyed.  Frequent crying spells. Talk with your health care provider if you think that you are experiencing depression. What should I know about immunizations? It is  important that you get and maintain your immunizations. These include:  Tetanus, diphtheria, and pertussis (Tdap) booster vaccine.  Influenza every year before the flu season begins.  Pneumonia vaccine.  Shingles vaccine. Your health care provider may also recommend other immunizations. This information is not intended to replace advice given to you by your health care provider. Make sure you discuss any questions you have with your health care provider. Document Released: 12/04/2005 Document Revised: 05/01/2016 Document Reviewed: 07/16/2015  2017 [employer]vier

## 2016-12-17 LAB — FECAL OCCULT BLOOD, IMMUNOCHEMICAL: Fecal Occult Bld: NEGATIVE

## 2016-12-24 ENCOUNTER — Telehealth: Payer: Self-pay | Admitting: Obstetrics and Gynecology

## 2016-12-24 NOTE — Telephone Encounter (Signed)
Patient called stating she was supposed to call you.Thanks

## 2016-12-24 NOTE — Telephone Encounter (Signed)
Pt aware of neg stool cards.  

## 2017-01-06 ENCOUNTER — Encounter: Payer: Self-pay | Admitting: Obstetrics and Gynecology

## 2017-01-06 ENCOUNTER — Ambulatory Visit (INDEPENDENT_AMBULATORY_CARE_PROVIDER_SITE_OTHER): Payer: BLUE CROSS/BLUE SHIELD | Admitting: Obstetrics and Gynecology

## 2017-01-06 VITALS — BP 138/80 | HR 64 | Ht 64.0 in | Wt 212.3 lb

## 2017-01-06 DIAGNOSIS — N393 Stress incontinence (female) (male): Secondary | ICD-10-CM | POA: Diagnosis not present

## 2017-01-06 DIAGNOSIS — N8111 Cystocele, midline: Secondary | ICD-10-CM

## 2017-01-06 DIAGNOSIS — N898 Other specified noninflammatory disorders of vagina: Secondary | ICD-10-CM | POA: Diagnosis not present

## 2017-01-06 NOTE — Patient Instructions (Signed)
1. Continue using Premarin cream intravaginal twice a week 2. Return in 2 weeks for follow-up on vaginal erosion and insertion of pessary

## 2017-01-06 NOTE — Progress Notes (Signed)
Chief complaint: 1. Vaginal erosion 2. Pessary maintenance  Patient presents for four-week follow-up on vaginal erosion. The ring pessary has been left out and she has been using estrogen cream intravaginal twice a week Patient reports only occasional light vaginal spotting, mostly when lifting. Patient denies vaginal discharge, vaginal pain, vaginal odor.  Past medical history, past surgical history, problem list, medications, and allergies are reviewed  Review of Systems  Constitutional: Negative.   Respiratory: Negative.  Negative for cough.   Cardiovascular: Negative.  Negative for chest pain and palpitations.  Gastrointestinal: Negative.  Negative for abdominal pain, constipation and diarrhea.  Genitourinary: Negative.  Negative for dysuria, frequency and urgency.   BP 138/80   Pulse 64   Ht 5\' 4"  (1.626 m)   Wt 212 lb 4.8 oz (96.3 kg)   BMI 36.44 kg/m   ABDOMEN: Soft, normal bowel sounds, no distention noted.  No tenderness, rebound or guarding. No hernias BLADDER: Normal PELVIC:             External Genitalia: Normal             BUS: Normal             Vagina: Mild atrophy; second-degree cystocele; posterior vaginal erosion,  1 x 2 cm without friability                 Cervix: Surgically absent             Uterus: Surgically absent             Adnexa: Normal; nonpalpable nontender             RV: External Exam NormaI, No Rectal Masses and Normal Sphincter tone   ASSESSMENT: 1. Second-degree cystocele 2. Posterior vaginal erosion, healing 3. Stress urinary incontinence, stable  PLAN: 1. Continue with Premarin cream intravaginal twice a week 2. Return in 2 weeks for recheck and probable pessary insertion.  A total of 15 minutes were spent face-to-face with the patient during this encounter and over half of that time dealt with counseling and coordination of care.  Herold HarmsMartin A Andyn Sales, MD  Note: This dictation was prepared with Dragon dictation along with smaller  phrase technology. Any transcriptional errors that result from this process are unintentional.

## 2017-01-13 ENCOUNTER — Telehealth: Payer: Self-pay | Admitting: *Deleted

## 2017-01-13 ENCOUNTER — Telehealth: Payer: Self-pay | Admitting: Obstetrics and Gynecology

## 2017-01-13 NOTE — Telephone Encounter (Signed)
Pt called to cancel march appt states she has decided to leave pessary out. She states she is not having any problems at this time.

## 2017-01-13 NOTE — Telephone Encounter (Signed)
Patient called and states she wanted to cancel her appt. She states she is going to leave her pessary out and if she need to come back in for a follow up she will call

## 2017-01-20 ENCOUNTER — Encounter: Payer: BLUE CROSS/BLUE SHIELD | Admitting: Obstetrics and Gynecology

## 2017-12-10 ENCOUNTER — Encounter: Payer: Self-pay | Admitting: Obstetrics and Gynecology

## 2017-12-10 ENCOUNTER — Ambulatory Visit (INDEPENDENT_AMBULATORY_CARE_PROVIDER_SITE_OTHER): Payer: BLUE CROSS/BLUE SHIELD | Admitting: Obstetrics and Gynecology

## 2017-12-10 VITALS — BP 134/71 | HR 72 | Ht 64.0 in | Wt 211.1 lb

## 2017-12-10 DIAGNOSIS — N393 Stress incontinence (female) (male): Secondary | ICD-10-CM

## 2017-12-10 DIAGNOSIS — N8111 Cystocele, midline: Secondary | ICD-10-CM | POA: Diagnosis not present

## 2017-12-10 DIAGNOSIS — R339 Retention of urine, unspecified: Secondary | ICD-10-CM | POA: Diagnosis not present

## 2017-12-10 DIAGNOSIS — K469 Unspecified abdominal hernia without obstruction or gangrene: Secondary | ICD-10-CM

## 2017-12-10 DIAGNOSIS — Z01419 Encounter for gynecological examination (general) (routine) without abnormal findings: Secondary | ICD-10-CM | POA: Diagnosis not present

## 2017-12-10 DIAGNOSIS — N816 Rectocele: Secondary | ICD-10-CM | POA: Diagnosis not present

## 2017-12-10 DIAGNOSIS — Z1211 Encounter for screening for malignant neoplasm of colon: Secondary | ICD-10-CM | POA: Diagnosis not present

## 2017-12-10 MED ORDER — ESTRADIOL 0.5 MG PO TABS: 0.5000 mg | ORAL_TABLET | Freq: Every day | ORAL | 3 refills | Status: DC

## 2017-12-10 NOTE — Progress Notes (Signed)
ANNUAL PREVENTATIVE CARE GYN  ENCOUNTER NOTE  Subjective:       Meredith Cooper is a 62 y.o. G52P1001 female here for a routine annual gynecologic exam.  Current complaints: 1.  None  Meredith Cooper presents today for her physical.  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 Bowel function is normal. Bladder function is notable for incomplete bladder emptying. She does have some chronic fatigue and is being seen by her primary care next week to assess for possible thyroid disease.     Gynecologic History No LMP recorded. Patient has had a hysterectomy. Contraception: status post hysterectomy Last Pap: unknown  Results were: normal Last mammogram: 12/10/2017- awaiting results   Obstetric History OB History  Gravida Para Term Preterm AB Living  1 1 1     1   SAB TAB Ectopic Multiple Live Births          1    # Outcome Date GA Lbr Len/2nd Weight Sex Delivery Anes PTL Lv  1 Term 1973   7 lb 11.2 oz (3.493 kg) F VBAC   LIV      Past Medical History:  Diagnosis Date  . AR (allergic rhinitis)   . GERD (gastroesophageal reflux disease)   . Hiatal hernia   . Hyperlipemia   . Hypertension   . Thyroid disease   . Vaginal prolapse     Past Surgical History:  Procedure Laterality Date  . ABDOMINAL HYSTERECTOMY  2000   [place] [facility]  . BREAST BIOPSY    . VEIN LIGATION AND STRIPPING      Current Outpatient Medications on File Prior to Visit  Medication Sig Dispense Refill  . azelastine (ASTELIN) 0.1 % nasal spray Place into both nostrils 2 (two) times daily. Use in each nostril as directed    . conjugated estrogens (PREMARIN) vaginal cream Place vaginally.    Marland Kitchen estradiol (ESTRACE) 0.5 MG tablet Take 1 tablet (0.5 mg total) by mouth daily. 90 tablet 3  . fluticasone (FLONASE) 50 MCG/ACT nasal spray Place into both nostrils daily.    Marland Kitchen levocetirizine (XYZAL) 5 MG tablet Take 5 mg by mouth every evening.    Marland Kitchen levothyroxine (SYNTHROID, LEVOTHROID) 112 MCG tablet Take  112 mcg by mouth daily before breakfast.    . lisinopril (PRINIVIL,ZESTRIL) 5 MG tablet Take 5 mg by mouth daily.    . montelukast (SINGULAIR) 10 MG tablet Take 10 mg by mouth at bedtime.    . pravastatin (PRAVACHOL) 20 MG tablet Take 20 mg by mouth daily.    . ranitidine (ZANTAC) 150 MG tablet Take by mouth.     No current facility-administered medications on file prior to visit.     Allergies  Allergen Reactions  . Penicillins Itching and Rash    Social History   Socioeconomic History  . Marital status: Married    Spouse name: Not on file  . Number of children: Not on file  . Years of education: Not on file  . Highest education level: Not on file  Social Needs  . Financial resource strain: Not on file  . Food insecurity - worry: Not on file  . Food insecurity - inability: Not on file  . Transportation needs - medical: Not on file  . Transportation needs - non-medical: Not on file  Occupational History  . Not on file  Tobacco Use  . Smoking status: Never Smoker  . Smokeless tobacco: Never Used  Substance and Sexual Activity  . Alcohol  use: No  . Drug use: No  . Sexual activity: Not Currently    Birth control/protection: Surgical  Other Topics Concern  . Not on file  Social History Narrative  . Not on file    Family History  Problem Relation Age of Onset  . Bone cancer Mother   . Bladder Cancer Sister   . Colon cancer Sister   . Diabetes Brother   . Ovarian cancer Maternal Grandmother   . Heart disease Neg Hx     The following portions of the patient's history were reviewed and updated as appropriate: allergies, current medications, past family history, past medical history, past social history, past surgical history and problem list.  Review of Systems Review of Systems  Constitutional: Negative.        No vasomotor symptoms on ERT  HENT: Negative.   Eyes: Negative.   Cardiovascular: Negative.   Gastrointestinal: Negative.        Bowel movements  daily No splinting is needed.  Genitourinary:       Incomplete bladder emptying is noted  Musculoskeletal: Negative.   Skin: Negative.   Neurological: Negative.   Endo/Heme/Allergies: Negative.   Psychiatric/Behavioral: Negative.      Objective:   BP 134/71   Pulse 72   Ht 5\' 4"  (1.626 m)   Wt 211 lb 1.6 oz (95.8 kg)   BMI 36.24 kg/m   CONSTITUTIONAL: Well-developed, well-nourished female in no acute distress.  PSYCHIATRIC: Normal mood and affect. Normal behavior. Normal judgment and thought content. NEUROLGIC: Alert and oriented to person, place, and time. Normal muscle tone coordination. No cranial nerve deficit noted. HENT:  Normocephalic, atraumatic, External right and left ear normal.  EYES: Conjunctivae and EOM are normal. No scleral icterus.  NECK: Normal range of motion, supple, no masses.  Normal thyroid.  SKIN: Skin is warm and dry. No rash noted. Not diaphoretic. No erythema. No pallor. CARDIOVASCULAR: Normal heart rate noted, regular rhythm, 1/6 systolic ejection murmur; no S3 or S4 RESPIRATORY: Clear to auscultation bilaterally. Effort and breath sounds normal, no problems with respiration noted. BREASTS: Symmetric in size, pendulous. No masses, skin changes, nipple drainage, or lymphadenopathy. ABDOMEN: Soft, normal bowel sounds, no distention noted.  No tenderness, rebound or guarding. No hernias BLADDER: Normal PELVIC:  External Genitalia: Normal  BUS: Normal  Vagina: Fair estrogen effect; third-degree cystocele is present; vaginal mucosa is healthy without evidence of erosion; enterocele is present; mild to moderate rectocele is present  Cervix: Surgically absent  Uterus: Surgically absent  Adnexa: Normal; nonpalpable nontender  RV: External Exam NormaI, No Rectal Masses and Normal Sphincter tone rectocele is confirmed MUSCULOSKELETAL: Normal range of motion. No tenderness.  No cyanosis, clubbing, or edema.  2+ distal pulses. LYMPHATIC: No Axillary,  Supraclavicular, or Inguinal Adenopathy.  PROCEDURE: Pessary fitting  2-1/2 inch gel horn pessary-successful fitting  Assessment:   Annual gynecologic examination 62 y.o. Contraception: status post hysterectomy [id number] Third-degree cystocele, symptomatic with incomplete bladder emptying Mild to moderate rectocele, asymptomatic Stress urinary incontinence, stable   Plan:  Pap: Not needed Mammogram: utd Stool Guaiac Testing:  Ordered Labs: thru pcp Routine preventative health maintenance measures emphasized: Exercise/Diet/Weight control, Tobacco Warnings and Alcohol/Substance use risks  Gel horn pessary is fitted today; patient will be contacted when the pessary arrives for insertion  Darol Destinerystal Miller, CMA  Herold HarmsMartin A Polly Barner, MD   Note: This dictation was prepared with Dragon dictation along with smaller phrase technology. Any transcriptional errors that result from this process are unintentional.

## 2017-12-10 NOTE — Patient Instructions (Signed)
1.  No Pap smear is needed 2.  Mammogram is up-to-date 3.  Stool guaiac testing is given for colon cancer screening 4.  Screening labs are to be obtained through primary care 5.  Pessary fitting is completed today-gel horn 2-1/2 inch 6.  Patient be contacted when pessary arrives for insertion 7.  Annual exam-1 year   Health Maintenance for Postmenopausal Women Menopause is a normal process in which your reproductive ability comes to an end. This process happens gradually over a span of months to years, usually between the ages of 28 and 3. Menopause is complete when you have missed 12 consecutive menstrual periods. It is important to talk with your health care provider about some of the most common conditions that affect postmenopausal women, such as heart disease, cancer, and bone loss (osteoporosis). Adopting a healthy lifestyle and getting preventive care can help to promote your health and wellness. Those actions can also lower your chances of developing some of these common conditions. What should I know about menopause? During menopause, you may experience a number of symptoms, such as:  Moderate-to-severe hot flashes.  Night sweats.  Decrease in sex drive.  Mood swings.  Headaches.  Tiredness.  Irritability.  Memory problems.  Insomnia.  Choosing to treat or not to treat menopausal changes is an individual decision that you make with your health care provider. What should I know about hormone replacement therapy and supplements? Hormone therapy products are effective for treating symptoms that are associated with menopause, such as hot flashes and night sweats. Hormone replacement carries certain risks, especially as you become older. If you are thinking about using estrogen or estrogen with progestin treatments, discuss the benefits and risks with your health care provider. What should I know about heart disease and stroke? Heart disease, heart attack, and stroke become  more likely as you age. This may be due, in part, to the hormonal changes that your body experiences during menopause. These can affect how your body processes dietary fats, triglycerides, and cholesterol. Heart attack and stroke are both medical emergencies. There are many things that you can do to help prevent heart disease and stroke:  Have your blood pressure checked at least every 1-2 years. High blood pressure causes heart disease and increases the risk of stroke.  If you are 79-53 years old, ask your health care provider if you should take aspirin to prevent a heart attack or a stroke.  Do not use any tobacco products, including cigarettes, chewing tobacco, or electronic cigarettes. If you need help quitting, ask your health care provider.  It is important to eat a healthy diet and maintain a healthy weight. ? Be sure to include plenty of vegetables, fruits, low-fat dairy products, and lean protein. ? Avoid eating foods that are high in solid fats, added sugars, or salt (sodium).  Get regular exercise. This is one of the most important things that you can do for your health. ? Try to exercise for at least 150 minutes each week. The type of exercise that you do should increase your heart rate and make you sweat. This is known as moderate-intensity exercise. ? Try to do strengthening exercises at least twice each week. Do these in addition to the moderate-intensity exercise.  Know your numbers.Ask your health care provider to check your cholesterol and your blood glucose. Continue to have your blood tested as directed by your health care provider.  What should I know about cancer screening? There are several types of cancer.  Take the following steps to reduce your risk and to catch any cancer development as early as possible. Breast Cancer  Practice breast self-awareness. ? This means understanding how your breasts normally appear and feel. ? It also means doing regular breast  self-exams. Let your health care provider know about any changes, no matter how small.  If you are 69 or older, have a clinician do a breast exam (clinical breast exam or CBE) every year. Depending on your age, family history, and medical history, it may be recommended that you also have a yearly breast X-ray (mammogram).  If you have a family history of breast cancer, talk with your health care provider about genetic screening.  If you are at high risk for breast cancer, talk with your health care provider about having an MRI and a mammogram every year.  Breast cancer (BRCA) gene test is recommended for women who have family members with BRCA-related cancers. Results of the assessment will determine the need for genetic counseling and BRCA1 and for BRCA2 testing. BRCA-related cancers include these types: ? Breast. This occurs in males or females. ? Ovarian. ? Tubal. This may also be called fallopian tube cancer. ? Cancer of the abdominal or pelvic lining (peritoneal cancer). ? Prostate. ? Pancreatic.  Cervical, Uterine, and Ovarian Cancer Your health care provider may recommend that you be screened regularly for cancer of the pelvic organs. These include your ovaries, uterus, and vagina. This screening involves a pelvic exam, which includes checking for microscopic changes to the surface of your cervix (Pap test).  For women ages 21-65, health care providers may recommend a pelvic exam and a Pap test every three years. For women ages 80-65, they may recommend the Pap test and pelvic exam, combined with testing for human papilloma virus (HPV), every five years. Some types of HPV increase your risk of cervical cancer. Testing for HPV may also be done on women of any age who have unclear Pap test results.  Other health care providers may not recommend any screening for nonpregnant women who are considered low risk for pelvic cancer and have no symptoms. Ask your health care provider if a screening  pelvic exam is right for you.  If you have had past treatment for cervical cancer or a condition that could lead to cancer, you need Pap tests and screening for cancer for at least 20 years after your treatment. If Pap tests have been discontinued for you, your risk factors (such as having a new sexual partner) need to be reassessed to determine if you should start having screenings again. Some women have medical problems that increase the chance of getting cervical cancer. In these cases, your health care provider may recommend that you have screening and Pap tests more often.  If you have a family history of uterine cancer or ovarian cancer, talk with your health care provider about genetic screening.  If you have vaginal bleeding after reaching menopause, tell your health care provider.  There are currently no reliable tests available to screen for ovarian cancer.  Lung Cancer Lung cancer screening is recommended for adults 90-57 years old who are at high risk for lung cancer because of a history of smoking. A yearly low-dose CT scan of the lungs is recommended if you:  Currently smoke.  Have a history of at least 30 pack-years of smoking and you currently smoke or have quit within the past 15 years. A pack-year is smoking an average of one pack of cigarettes  per day for one year.  Yearly screening should:  Continue until it has been 15 years since you quit.  Stop if you develop a health problem that would prevent you from having lung cancer treatment.  Colorectal Cancer  This type of cancer can be detected and can often be prevented.  Routine colorectal cancer screening usually begins at age 57 and continues through age 27.  If you have risk factors for colon cancer, your health care provider may recommend that you be screened at an earlier age.  If you have a family history of colorectal cancer, talk with your health care provider about genetic screening.  Your health care  provider may also recommend using home test kits to check for hidden blood in your stool.  A small camera at the end of a tube can be used to examine your colon directly (sigmoidoscopy or colonoscopy). This is done to check for the earliest forms of colorectal cancer.  Direct examination of the colon should be repeated every 5-10 years until age 3. However, if early forms of precancerous polyps or small growths are found or if you have a family history or genetic risk for colorectal cancer, you may need to be screened more often.  Skin Cancer  Check your skin from head to toe regularly.  Monitor any moles. Be sure to tell your health care provider: ? About any new moles or changes in moles, especially if there is a change in a mole's shape or color. ? If you have a mole that is larger than the size of a pencil eraser.  If any of your family members has a history of skin cancer, especially at a young age, talk with your health care provider about genetic screening.  Always use sunscreen. Apply sunscreen liberally and repeatedly throughout the day.  Whenever you are outside, protect yourself by wearing long sleeves, pants, a wide-brimmed hat, and sunglasses.  What should I know about osteoporosis? Osteoporosis is a condition in which bone destruction happens more quickly than new bone creation. After menopause, you may be at an increased risk for osteoporosis. To help prevent osteoporosis or the bone fractures that can happen because of osteoporosis, the following is recommended:  If you are 48-50 years old, get at least 1,000 mg of calcium and at least 600 mg of vitamin D per day.  If you are older than age 23 but younger than age 40, get at least 1,200 mg of calcium and at least 600 mg of vitamin D per day.  If you are older than age 69, get at least 1,200 mg of calcium and at least 800 mg of vitamin D per day.  Smoking and excessive alcohol intake increase the risk of osteoporosis. Eat  foods that are rich in calcium and vitamin D, and do weight-bearing exercises several times each week as directed by your health care provider. What should I know about how menopause affects my mental health? Depression may occur at any age, but it is more common as you become older. Common symptoms of depression include:  Low or sad mood.  Changes in sleep patterns.  Changes in appetite or eating patterns.  Feeling an overall lack of motivation or enjoyment of activities that you previously enjoyed.  Frequent crying spells.  Talk with your health care provider if you think that you are experiencing depression. What should I know about immunizations? It is important that you get and maintain your immunizations. These include:  Tetanus, diphtheria, and  pertussis (Tdap) booster vaccine.  Influenza every year before the flu season begins.  Pneumonia vaccine.  Shingles vaccine.  Your health care provider may also recommend other immunizations. This information is not intended to replace advice given to you by your health care provider. Make sure you discuss any questions you have with your health care provider. Document Released: 12/04/2005 Document Revised: 05/01/2016 Document Reviewed: 07/16/2015 Elsevier Interactive Patient Education  2018 [employer].

## 2017-12-14 ENCOUNTER — Encounter: Payer: BLUE CROSS/BLUE SHIELD | Admitting: Obstetrics and Gynecology

## 2017-12-16 ENCOUNTER — Telehealth: Payer: Self-pay | Admitting: Obstetrics and Gynecology

## 2017-12-16 NOTE — Telephone Encounter (Signed)
The patient called and stated that she would like for SunGard[employer]rystal Miller to call her back. Please advise.

## 2017-12-16 NOTE — Telephone Encounter (Signed)
Pt states she had an isolated incident yesterday where she was unable to hold her urine. No lifting or straining. NO pain or uti sx. States she last urinated about an hour or so prior to incident. Advised to not drink caffeine. Empty bladder q 1 1/2 h. Pessary is ordered.

## 2017-12-17 LAB — SPECIMEN STATUS REPORT

## 2017-12-17 LAB — FECAL OCCULT BLOOD, IMMUNOCHEMICAL: FECAL OCCULT BLD: NEGATIVE

## 2017-12-23 ENCOUNTER — Encounter: Payer: Self-pay | Admitting: Obstetrics and Gynecology

## 2017-12-23 ENCOUNTER — Ambulatory Visit (INDEPENDENT_AMBULATORY_CARE_PROVIDER_SITE_OTHER): Payer: BLUE CROSS/BLUE SHIELD | Admitting: Obstetrics and Gynecology

## 2017-12-23 VITALS — BP 170/78 | HR 67 | Ht 62.0 in | Wt 213.0 lb

## 2017-12-23 DIAGNOSIS — Z90710 Acquired absence of both cervix and uterus: Secondary | ICD-10-CM

## 2017-12-23 DIAGNOSIS — N8111 Cystocele, midline: Secondary | ICD-10-CM

## 2017-12-23 DIAGNOSIS — N816 Rectocele: Secondary | ICD-10-CM

## 2017-12-23 DIAGNOSIS — K469 Unspecified abdominal hernia without obstruction or gangrene: Secondary | ICD-10-CM

## 2017-12-23 MED ORDER — ESTRADIOL 1 MG PO TABS: 1.0000 mg | ORAL_TABLET | Freq: Every day | ORAL | 6 refills | Status: DC

## 2017-12-23 NOTE — Patient Instructions (Signed)
1.  Insert Premarin cream 1/2 g intravaginal once a week 2.  Insert Trimosan gel intravaginal once a week 3.  Return in 4 weeks for pessary maintenance

## 2017-12-23 NOTE — Progress Notes (Signed)
Chief complaint: 1.  Pessary insertion  Meredith Cooper presents today for gel horn pessary insertion.  Patient previously was using the diaphragm with support pessary which was ineffective.  OBJECTIVE: BP (!) 170/78   Pulse 67   Ht 5\' 2"  (1.575 m)   Wt 213 lb (96.6 kg)   BMI 38.96 kg/m  PELVIC:             External Genitalia: Normal             BUS: Normal             Vagina: Fair estrogen effect; third-degree cystocele is present; vaginal mucosa is healthy without evidence of erosion; enterocele is present; mild to moderate rectocele is present             Cervix: Surgically absent             Uterus: Surgically absent             Adnexa: Normal; nonpalpable nontender             RV: External Exam NormaI  PROCEDURE: Pessary insertion-gel horn  ASSESSMENT: 1.  Third-degree cystocele, symptomatic with incomplete bladder emptying 2.  Mild to moderate rectocele, asymptomatic 3.  Enterocele  PLAN: 1.  Gel horn pessary insertion successful today 2.  Premarin cream intravaginal 1/2 g weekly 3.  Trimosan gel intravaginal weekly 4.  Return in 4 weeks for follow-up pessary maintenance  Herold HarmsMartin A Defrancesco, MD  Note: This dictation was prepared with Dragon dictation along with smaller phrase technology. Any transcriptional errors that result from this process are unintentional.

## 2017-12-23 NOTE — Addendum Note (Signed)
Addended by: Marchelle FolksMILLER, Prerana Strayer G on: 12/23/2017 11:21 AM   Modules accepted: Orders

## 2018-01-19 ENCOUNTER — Ambulatory Visit (INDEPENDENT_AMBULATORY_CARE_PROVIDER_SITE_OTHER): Payer: BLUE CROSS/BLUE SHIELD | Admitting: Obstetrics and Gynecology

## 2018-01-19 ENCOUNTER — Encounter: Payer: Self-pay | Admitting: Obstetrics and Gynecology

## 2018-01-19 VITALS — BP 150/76 | HR 58 | Ht 62.0 in | Wt 215.0 lb

## 2018-01-19 DIAGNOSIS — N393 Stress incontinence (female) (male): Secondary | ICD-10-CM | POA: Diagnosis not present

## 2018-01-19 DIAGNOSIS — N816 Rectocele: Secondary | ICD-10-CM

## 2018-01-19 DIAGNOSIS — N8111 Cystocele, midline: Secondary | ICD-10-CM

## 2018-01-19 DIAGNOSIS — Z4689 Encounter for fitting and adjustment of other specified devices: Secondary | ICD-10-CM | POA: Diagnosis not present

## 2018-01-19 NOTE — Patient Instructions (Signed)
1.  Return in 6 weeks for pessary maintenance 2.  Continue using Trimosan gel intravaginal once a week 3.  Continue using Imuran cream intravaginal once a week

## 2018-01-19 NOTE — Progress Notes (Signed)
Chief complaint: 1.  Pessary maintenance 2.  Third-degree cystocele, symptomatic with incomplete bladder emptying 3.  Mild to moderate rectocele, asymptomatic 4.  Enterocele  Patient presents for 4-week pessary check.  She is using Trimosan gel intravaginal weekly and Premarin cream intravaginal weekly.  She reports normal bowel function.  She reports more complete bladder emptying, but does note occasional urinary incontinence not always associated with stress. No vaginal odor, vaginal discharge, or vaginal bleeding has been appreciated  Past medical history, past surgical history, problem list, medications, and allergies are reviewed  OBJECTIVE: BP (!) 150/76   Pulse (!) 58   Ht 5\' 2"  (1.575 m)   Wt 215 lb (97.5 kg)   BMI 39.32 kg/m  Pleasant elderly female in no acute distress. Abdomen: Soft, nontender PELVIC: External Genitalia: Normal BUS: Normal Vagina:Fair estrogen effect; third-degree cystocele is present; vaginal mucosa is healthy without evidence of erosion; enterocele is present; mild to moderate rectocele is present.  There is minimal hyperemia noted on the posterior vagina without ulceration Cervix: Surgically absent Uterus: Surgically absent Adnexa: Normal; nonpalpable nontender RV: External Exam NormaI   PROCEDURE: 52 mm gel horn pessary is removed, cleaned, and reinserted  ASSESSMENT: 1.  Third-degree cystocele, symptomatic with incomplete bladder emptying; improved with pessary use 2.  Mild to moderate rectocele, asymptomatic 3.  Enterocele 4.  Normal pessary maintenance  PLAN: 1.  Gel horn pessary was removed, cleaned, reinserted 2.  To use Premarin cream intravaginal 1/2 g weekly 3.  Continue Trimosan gel intravaginal weekly 4.  Return in 6 weeks for follow-up pessary maintenance  A total of 15 minutes were spent face-to-face with the patient during this encounter and  over half of that time dealt with counseling and coordination of care.  Herold HarmsMartin A Xayden Linsey, MD  Note: This dictation was prepared with Dragon dictation along with smaller phrase technology. Any transcriptional errors that result from this process are unintentional.

## 2018-02-03 ENCOUNTER — Encounter (INDEPENDENT_AMBULATORY_CARE_PROVIDER_SITE_OTHER): Payer: BLUE CROSS/BLUE SHIELD | Admitting: Obstetrics and Gynecology

## 2018-02-04 NOTE — Progress Notes (Signed)
Appointment rescheduled   This encounter was created in error - please disregard. 

## 2018-02-28 ENCOUNTER — Encounter: Payer: Self-pay | Admitting: Obstetrics and Gynecology

## 2018-02-28 ENCOUNTER — Ambulatory Visit (INDEPENDENT_AMBULATORY_CARE_PROVIDER_SITE_OTHER): Payer: BLUE CROSS/BLUE SHIELD | Admitting: Obstetrics and Gynecology

## 2018-02-28 VITALS — BP 133/82 | HR 65 | Ht 62.0 in | Wt 215.6 lb

## 2018-02-28 DIAGNOSIS — Z90710 Acquired absence of both cervix and uterus: Secondary | ICD-10-CM

## 2018-02-28 DIAGNOSIS — Z4689 Encounter for fitting and adjustment of other specified devices: Secondary | ICD-10-CM

## 2018-02-28 DIAGNOSIS — N816 Rectocele: Secondary | ICD-10-CM

## 2018-02-28 DIAGNOSIS — K469 Unspecified abdominal hernia without obstruction or gangrene: Secondary | ICD-10-CM | POA: Diagnosis not present

## 2018-02-28 DIAGNOSIS — N8111 Cystocele, midline: Secondary | ICD-10-CM

## 2018-02-28 NOTE — Progress Notes (Signed)
Chief complaint: 1.  Pessary maintenance 2.  Third-degree cystocele, symptomatic with incomplete bladder emptying 3.  Mild to moderate rectocele, asymptomatic 4.  Enterocele  Patient presents for 6-week pessary check.  Last visit was 01/19/2018. Medications:  Trimosan gel intravaginal weekly  Premarin cream intravaginal weekly Bowel and bladder function are normal with the pessary in place. No symptoms of vaginal odor, vaginal discharge, or vaginal bleeding has been noted.  No pelvic pain.  Past medical history, past surgical history, problem list, medications, and allergies are reviewed  OBJECTIVE: BP 133/82   Pulse 65   Ht  (1.575 m)   Wt 215 lb 9.6 oz (97.8 kg)   BMI 39.43 kg/m  Pleasant well-appearing female no acute distress.  Alert and oriented. Abdomen: Soft, nontender Pelvic exam: External genitalia-normal BUS-normal Vagina-fair estrogen effect; third-degree cystocele; enterocele; mild to moderate rectocele; vaginal mucosa appears healthy with minimal hyperemia and no erosions Cervix-surgically absent Uterus-surgically absent Bimanual-not examined Rectovaginal-normal external exam Bladder-nontender  PROCEDURE: 52 mm Gellhorn pessary is removed, cleaned, and reinserted  ASSESSMENT: 1.  Normal pessary maintenance 2.  Third-degree cystocele, history of incomplete bladder emptying, improved with pessary use 3.  Mild to moderate rectocele, asymptomatic 4.  Enterocele, asymptomatic with pessary use  PLAN: 1.  52 mm Gellhorn pessary is removed, cleaned, and reinserted 2.  Premarin cream intravaginal 1/2 g weekly 3.  Trimosan gel intravaginal weekly 4.  Return in 8 weeks for pessary maintenance  A total of 15 minutes were spent face-to-face with the patient during this encounter and over half of that time dealt with counseling and coordination of care.  Herold Harms, MD  Note: This dictation was prepared with Dragon dictation along with smaller phrase  technology. Any transcriptional errors that result from this process are unintentional.

## 2018-02-28 NOTE — Patient Instructions (Signed)
1.  Return in 8 weeks for pessary maintenance 2.  Continue using Premarin cream intravaginal weekly 3.  Continue using Trimosan gel intravaginal weekly

## 2018-03-24 ENCOUNTER — Telehealth: Payer: Self-pay | Admitting: Obstetrics and Gynecology

## 2018-03-24 NOTE — Telephone Encounter (Signed)
The patient called at 8:29 stating her pessary has come out and wanted to know does she need to come in now or wait until her July 2 appointment with Dr. Tommi Rumps?  Please call the patient back at [phone] and let her know.  Please advise.  Thank you

## 2018-03-24 NOTE — Telephone Encounter (Signed)
Pt states she was uncomfortable and went to the bathroom. Her pessary was almost out. She tried to push it back in but was not successful. She removed the pessary. Appt made for 03/30/2018 at 10:30.

## 2018-03-30 ENCOUNTER — Encounter: Payer: Self-pay | Admitting: Obstetrics and Gynecology

## 2018-03-30 ENCOUNTER — Ambulatory Visit (INDEPENDENT_AMBULATORY_CARE_PROVIDER_SITE_OTHER): Payer: BLUE CROSS/BLUE SHIELD | Admitting: Obstetrics and Gynecology

## 2018-03-30 VITALS — BP 155/73 | HR 55 | Ht 65.0 in | Wt 214.0 lb

## 2018-03-30 DIAGNOSIS — Z4689 Encounter for fitting and adjustment of other specified devices: Secondary | ICD-10-CM

## 2018-03-30 DIAGNOSIS — N816 Rectocele: Secondary | ICD-10-CM

## 2018-03-30 DIAGNOSIS — Z90710 Acquired absence of both cervix and uterus: Secondary | ICD-10-CM | POA: Diagnosis not present

## 2018-03-30 DIAGNOSIS — N8111 Cystocele, midline: Secondary | ICD-10-CM | POA: Diagnosis not present

## 2018-03-30 DIAGNOSIS — K469 Unspecified abdominal hernia without obstruction or gangrene: Secondary | ICD-10-CM | POA: Diagnosis not present

## 2018-03-30 NOTE — Progress Notes (Signed)
Chief complaint: 1.  Pessary maintenance 2.  Midline cystocele 3.  Rectocele 4.  Enterocele  Patient presents today for evaluation.  Pessary fell out last week after she had been doing some atypical lifting.  She experienced no vaginal bleeding.  She had slight pelvic discomfort during expulsion of the pessary, none since.  Past medical history, past surgical history, problem list, medications, and allergies are reviewed  OBJECTIVE: BP (!) 155/73   Pulse (!) 55   Ht 5\' 5"  (1.651 m)   Wt 214 lb (97.1 kg)   BMI 35.61 kg/m  Pleasant female no acute distress.  Alert and oriented.  Affect is appropriate. Abdomen: Soft, nontender without organomegaly Pelvic exam: External genitalia-normal BUS-normal Vagina-third-degree cystocele and enterocele prolapsed through the vaginal introitus; moderate rectocele present.  Vaginal mucosa appears healthy. Cervix-surgically absent Uterus-surgically absent Bimanual-no palpable masses or tenderness Rectovaginal-normal external exam; internal exam deferred  PROCEDURE: 52 mm Gellhorn pessary is inserted in standard fashion.  ASSESSMENT: 1.  Status post expulsion of gel horn pessary 2.  Third-degree cystocele 3.  Enterocele 4.  Moderate rectocele  PLAN: 1.  52 mm Gellhorn pessary is inserted today 2.  Continue inserting Trimosan gel intravaginal weekly 3.  Continue inserting Premarin cream intravaginal weekly 4.  Return in 8 weeks for follow-up or as needed if pelvic pain or pessary expulsion occurs.  A total of 15 minutes were spent face-to-face with the patient during this encounter and over half of that time dealt with counseling and coordination of care.  Herold HarmsMartin A Jacques Fife, MD  Note: This dictation was prepared with Dragon dictation along with smaller phrase technology. Any transcriptional errors that result from this process are unintentional.

## 2018-03-30 NOTE — Patient Instructions (Signed)
1.  Pessary is inserted today 2.  Continue using Trimosan gel intravaginal weekly 3.  Continue using Premarin cream intravaginal weekly 4.  Return in 8 weeks for pessary maintenance

## 2018-04-26 ENCOUNTER — Encounter: Payer: BLUE CROSS/BLUE SHIELD | Admitting: Obstetrics and Gynecology

## 2018-05-19 ENCOUNTER — Other Ambulatory Visit: Payer: Self-pay

## 2018-05-19 ENCOUNTER — Ambulatory Visit (INDEPENDENT_AMBULATORY_CARE_PROVIDER_SITE_OTHER): Payer: BLUE CROSS/BLUE SHIELD | Admitting: Obstetrics and Gynecology

## 2018-05-19 VITALS — BP 146/80 | HR 66 | Wt 210.0 lb

## 2018-05-19 DIAGNOSIS — N816 Rectocele: Secondary | ICD-10-CM

## 2018-05-19 DIAGNOSIS — N8111 Cystocele, midline: Secondary | ICD-10-CM

## 2018-05-19 DIAGNOSIS — Z4689 Encounter for fitting and adjustment of other specified devices: Secondary | ICD-10-CM

## 2018-05-19 DIAGNOSIS — K469 Unspecified abdominal hernia without obstruction or gangrene: Secondary | ICD-10-CM

## 2018-05-19 MED ORDER — OXYQUINOLONE SULFATE 0.025 % VA GEL: 1.0000 g | VAGINAL | 2 refills | Status: AC

## 2018-05-19 MED ORDER — OXYQUINOLONE SULFATE 0.025 % VA GEL: 1.0000 g | VAGINAL | 2 refills | Status: DC

## 2018-05-19 MED ORDER — ESTROGENS, CONJUGATED 0.625 MG/GM VA CREA: TOPICAL_CREAM | VAGINAL | 3 refills | Status: DC

## 2018-05-19 NOTE — Progress Notes (Signed)
Pt states no concerns.  Chief complaint: 1.  Pessary maintenance 2.  Midline cystocele 3.  Rectocele 4.  Enterocele  Patient presents for 6-week follow-up.  (Last visit 03/30/2018 Pessary type: 52 mm Gellhorn Medications:   Trimosan gel intravaginal weekly  Premarin cream intravaginal weekly Patient reports good bladder control.  Bowel function is normal.  She denies any pelvic discomfort or rupture.  She reports no vaginal bleeding.  Past medical history, past surgical history, problem list, medications, and allergies are reviewed  OBJECTIVE: BP (!) 146/80   Pulse 66   Wt 210 lb (95.3 kg)   BMI 34.95 kg/m  Pleasant well-appearing female no acute distress.  Alert and oriented. Affect is appropriate. Abdomen: Soft, nontender without organomegaly Pelvic exam: External genitalia-normal BUS-normal Vagina-degree cystocele and enterocele; moderate rectocele; mucosa appears healthy with except for some mild friable abrasion noted in the proximal vagina, possibly secondary to pessary removal Cervix-surgically absent Uterus have been surgically absent Bimanual-no palpable masses or tenderness Rectovaginal-normal external exam Extremities are warm and dry  PROCEDURE: 52 mm Gellhorn pessary is removed, cleaned, and reinserted  Assessment: 1.  Third-degree cystocele, asymptomatic with pessary use 2.  Enterocele, asymptomatic with pessary use 3.  Moderate rectocele, asymptomatic with pessary use 4.  Vaginal apex abrasion, likely secondary to pessary removal with minimal friability  PLAN: 1.  The gel horn pessary, 52 mm, is removed, cleaned and reinserted today. 2.  Patient is to continue with Trimosan gel intravaginal weekly 3.  Patient is to continue with Premarin cream intravaginal weekly 4.  Patient is to return in 6 weeks for pessary maintenance  A total of 15 minutes were spent face-to-face with the patient during this encounter and over half of that time dealt with counseling  and coordination of care.  Herold HarmsMartin A Cedar Roseman, MD  Note: This dictation was prepared with Dragon dictation along with smaller phrase technology. Any transcriptional errors that result from this process are unintentional.

## 2018-05-19 NOTE — Patient Instructions (Signed)
1.  Return in 6 weeks for pessary maintenance 2.  Continue with Premarin cream intravaginal weekly 3.  Continue with Trimosan gel intravaginal weekly

## 2018-06-30 ENCOUNTER — Ambulatory Visit (INDEPENDENT_AMBULATORY_CARE_PROVIDER_SITE_OTHER): Payer: BLUE CROSS/BLUE SHIELD | Admitting: Obstetrics and Gynecology

## 2018-06-30 ENCOUNTER — Encounter: Payer: Self-pay | Admitting: Obstetrics and Gynecology

## 2018-06-30 VITALS — BP 153/75 | HR 69 | Ht 65.0 in | Wt 210.5 lb

## 2018-06-30 DIAGNOSIS — Z4689 Encounter for fitting and adjustment of other specified devices: Secondary | ICD-10-CM

## 2018-06-30 DIAGNOSIS — Z90710 Acquired absence of both cervix and uterus: Secondary | ICD-10-CM | POA: Diagnosis not present

## 2018-06-30 DIAGNOSIS — N8111 Cystocele, midline: Secondary | ICD-10-CM | POA: Diagnosis not present

## 2018-06-30 DIAGNOSIS — K469 Unspecified abdominal hernia without obstruction or gangrene: Secondary | ICD-10-CM

## 2018-06-30 DIAGNOSIS — N816 Rectocele: Secondary | ICD-10-CM

## 2018-06-30 NOTE — Progress Notes (Signed)
Chief complaint: 1.  Pessary maintenance 2.  Midline cystocele 3.  Rectocele 4.  Enterocele  Pessary maintenance-6-week follow-up (last visit 05/19/2018) Pessary type: 52 mm Gellhorn Medications:  Trimosan gel intravaginal weekly  Premarin cream intravaginal weekly Patient reports no major interval changes.  Bladder control is good.  Bowel function is normal.  She is not having any pelvic discomfort.  No vaginal discharge, vaginal bleeding, or vaginal odor.  Past medical history, past surgical history, problem list, medications, and allergies are reviewed  OBJECTIVE: BP (!) 153/75   Pulse 69   Ht 5\' 5"  (1.651 m)   Wt 210 lb 8 oz (95.5 kg)   BMI 35.03 kg/m  Pleasant well-appearing female no acute distress.  Alert and oriented. Affect is appropriate. Abdomen: Soft, nontender; no organomegaly Pelvic exam: External genitalia-normal BUS-normal Vagina-second-degree cystocele; enterocele; moderate rectocele; mucosa has fair estrogen effect with minimal friable epithelial abrasion in proximal vagina, likely secondary to pessary removal Cervix-surgically absent Uterus-surgically absent Bimanual-no palpable masses or tenderness Rectovaginal-normal external exam  PROCEDURE: 52 mm Gellhorn pessary is removed, cleaned, and reinserted  ASSESSMENT: 1.  Third-degree cystocele, asymptomatic with pessary use 2.  Enterocele, asymptomatic with pessary use 3.  Moderate rectocele, asymptomatic with pessary use 4.  Vaginal apex abrasion, likely secondary to pessary removal with minimal friability  PLAN: 1.  Gel horn pessary 52 mm is removed, cleaned, and reinserted 2.  Continue Trimosan gel intravaginal weekly 3.  Continue with Premarin cream intravaginal weekly 4.  Return in 12 weeks for pessary maintenance  A total of 15 minutes were spent face-to-face with the patient during this encounter and over half of that time dealt with counseling and coordination of care.  Herold Harms,  MD  Note: This dictation was prepared with Dragon dictation along with smaller phrase technology. Any transcriptional errors that result from this process are unintentional.

## 2018-06-30 NOTE — Patient Instructions (Signed)
1.  Return in 3 months for pessary maintenance 2.  Continue using Trimosan gel intravaginal weekly

## 2018-08-22 ENCOUNTER — Telehealth: Payer: Self-pay

## 2018-08-22 MED ORDER — ESTRADIOL 1 MG PO TABS: 1.0000 mg | ORAL_TABLET | Freq: Every day | ORAL | 6 refills | Status: DC

## 2018-08-22 NOTE — Telephone Encounter (Signed)
Pt request refill of estradiol. Med erx.

## 2018-09-20 ENCOUNTER — Telehealth: Payer: Self-pay | Admitting: Obstetrics and Gynecology

## 2018-09-20 NOTE — Telephone Encounter (Signed)
The patient states she had a pessary and it came out shortly after her last appointment, so she is asking about her impending surgery and if Dr. Phillis Knackefran will be able to do the surgery for her, and who will she have to follow up with?  Dr. Tommi Rumpse or Valentino Saxonherry?  She is also concerned because she works at [employer] and she has a lot of sick time built up and has to use it before she can use her PTO or Personal time there, and she wants to be sure she does not lose it.  She is asking to speak to her nurse about the above, Please advise, thanks.

## 2018-09-20 NOTE — Telephone Encounter (Signed)
Spoke with patient and she is wanting to discuss how long she would need to be out for her surgery. She is going to discuss during visit with Dr. Tommi Rumpse next week.

## 2018-09-29 ENCOUNTER — Encounter: Payer: BLUE CROSS/BLUE SHIELD | Admitting: Obstetrics and Gynecology

## 2018-12-15 ENCOUNTER — Encounter: Payer: Self-pay | Admitting: Obstetrics and Gynecology

## 2018-12-15 ENCOUNTER — Encounter: Payer: BLUE CROSS/BLUE SHIELD | Admitting: Obstetrics and Gynecology

## 2018-12-15 ENCOUNTER — Ambulatory Visit: Payer: Medicare Other | Admitting: Obstetrics and Gynecology

## 2018-12-15 VITALS — BP 160/84 | HR 74 | Ht 65.0 in | Wt 222.1 lb

## 2018-12-15 DIAGNOSIS — Z1239 Encounter for other screening for malignant neoplasm of breast: Secondary | ICD-10-CM

## 2018-12-15 DIAGNOSIS — I10 Essential (primary) hypertension: Secondary | ICD-10-CM | POA: Diagnosis not present

## 2018-12-15 DIAGNOSIS — E785 Hyperlipidemia, unspecified: Secondary | ICD-10-CM | POA: Diagnosis not present

## 2018-12-15 DIAGNOSIS — R339 Retention of urine, unspecified: Secondary | ICD-10-CM

## 2018-12-15 DIAGNOSIS — Z79890 Hormone replacement therapy (postmenopausal): Secondary | ICD-10-CM

## 2018-12-15 DIAGNOSIS — Z01419 Encounter for gynecological examination (general) (routine) without abnormal findings: Secondary | ICD-10-CM

## 2018-12-15 DIAGNOSIS — N8110 Cystocele, unspecified: Secondary | ICD-10-CM

## 2018-12-15 DIAGNOSIS — E669 Obesity, unspecified: Secondary | ICD-10-CM

## 2018-12-15 DIAGNOSIS — Z4689 Encounter for fitting and adjustment of other specified devices: Secondary | ICD-10-CM

## 2018-12-15 NOTE — Progress Notes (Signed)
PT is present today for her annual exam. Pt stated that she has been doing self-breast exams monthly. Pt stated that she is doing well and denies any issues. No problems or concerns.     

## 2018-12-15 NOTE — Patient Instructions (Addendum)
Breast Self-Awareness Breast self-awareness means:  Knowing how your breasts look.  Knowing how your breasts feel.  Checking your breasts every month for changes.  Telling your doctor if you notice a change in your breasts. Breast self-awareness allows you to notice a breast problem early while it is still small. How to do a breast self-exam One way to learn what is normal for your breasts and to check for changes is to do a breast self-exam. To do a breast self-exam: Look for Changes  1. Take off all the clothes above your waist. 2. Stand in front of a mirror in a room with good lighting. 3. Put your hands on your hips. 4. Push your hands down. 5. Look at your breasts and nipples in the mirror to see if one breast or nipple looks different than the other. Check to see if: ? The shape of one breast is different. ? The size of one breast is different. ? There are wrinkles, dips, and bumps in one breast and not the other. 6. Look at each breast for changes in your skin, such as: ? Redness. ? Scaly areas. 7. Look for changes in your nipples, such as: ? Liquid around the nipples. ? Bleeding. ? Dimpling. ? Redness. ? A change in where the nipples are. Feel for Changes 1. Lie on your back on the floor. 2. Feel each breast. To do this, follow these steps: ? Pick a breast to feel. ? Put the arm closest to that breast above your head. ? Use your other arm to feel the nipple area of your breast. Feel the area with the pads of your three middle fingers by making small circles with your fingers. For the first circle, press lightly. For the second circle, press harder. For the third circle, press even harder. ? Keep making circles with your fingers at the light, harder, and even harder pressures as you move down your breast. Stop when you feel your ribs. ? Move your fingers a little toward the center of your body. ? Start making circles with your fingers again, this time going up until you  reach your collarbone. ? Keep making up and down circles until you reach your armpit. Remember to keep using the three pressures. ? Feel the other breast in the same way. 3. Sit or stand in the shower or tub. 4. With soapy water on your skin, feel each breast the same way you did in step 2, when you were lying on the floor. Write Down What You Find After doing the self-exam, write down:  What is normal for each breast.  Any changes you find in each breast.  When you last had your period.  How often should I check my breasts? Check your breasts every month. If you are breastfeeding, the best time to check them is after you feed your baby or after you use a breast pump. If you get periods, the best time to check your breasts is 5-7 days after your period is over. When should I see my doctor? See your doctor if you notice:  A change in shape or size of your breasts or nipples.  A change in the skin of your breast or nipples, such as red or scaly skin.  Unusual fluid coming from your nipples.  A lump or thick area that was not there before.  Pain in your breasts.  Anything that concerns you. This information is not intended to replace advice given to you by your  health care provider. Make sure you discuss any questions you have with your health care provider. Document Released: 03/30/2008 Document Revised: 03/19/2016 Document Reviewed: 09/01/2015 Elsevier Interactive Patient Education  2019 [employer]     Health Maintenance for Postmenopausal Women Menopause is a normal process in which your reproductive ability comes to an end. This process happens gradually over a span of months to years, usually between the ages of 48 and 55. Menopause is complete when you have missed 12 consecutive menstrual periods. It is important to talk with your health care provider about some of the most common conditions that affect postmenopausal women, such as heart disease, cancer, and bone loss  (osteoporosis). Adopting a healthy lifestyle and getting preventive care can help to promote your health and wellness. Those actions can also lower your chances of developing some of these common conditions. What should I know about menopause? During menopause, you may experience a number of symptoms, such as:  Moderate-to-severe hot flashes.  Night sweats.  Decrease in sex drive.  Mood swings.  Headaches.  Tiredness.  Irritability.  Memory problems.  Insomnia. Choosing to treat or not to treat menopausal changes is an individual decision that you make with your health care provider. What should I know about hormone replacement therapy and supplements? Hormone therapy products are effective for treating symptoms that are associated with menopause, such as hot flashes and night sweats. Hormone replacement carries certain risks, especially as you become older. If you are thinking about using estrogen or estrogen with progestin treatments, discuss the benefits and risks with your health care provider. What should I know about heart disease and stroke? Heart disease, heart attack, and stroke become more likely as you age. This may be due, in part, to the hormonal changes that your body experiences during menopause. These can affect how your body processes dietary fats, triglycerides, and cholesterol. Heart attack and stroke are both medical emergencies. There are many things that you can do to help prevent heart disease and stroke:  Have your blood pressure checked at least every 1-2 years. High blood pressure causes heart disease and increases the risk of stroke.  If you are 55-79 years old, ask your health care provider if you should take aspirin to prevent a heart attack or a stroke.  Do not use any tobacco products, including cigarettes, chewing tobacco, or electronic cigarettes. If you need help quitting, ask your health care provider.  It is important to eat a healthy diet and  maintain a healthy weight. ? Be sure to include plenty of vegetables, fruits, low-fat dairy products, and lean protein. ? Avoid eating foods that are high in solid fats, added sugars, or salt (sodium).  Get regular exercise. This is one of the most important things that you can do for your health. ? Try to exercise for at least 150 minutes each week. The type of exercise that you do should increase your heart rate and make you sweat. This is known as moderate-intensity exercise. ? Try to do strengthening exercises at least twice each week. Do these in addition to the moderate-intensity exercise.  Know your numbers.Ask your health care provider to check your cholesterol and your blood glucose. Continue to have your blood tested as directed by your health care provider.  What should I know about cancer screening? There are several types of cancer. Take the following steps to reduce your risk and to catch any cancer development as early as possible. Breast Cancer  Practice breast self-awareness. ?   This means understanding how your breasts normally appear and feel. ? It also means doing regular breast self-exams. Let your health care provider know about any changes, no matter how small.  If you are 53 or older, have a clinician do a breast exam (clinical breast exam or CBE) every year. Depending on your age, family history, and medical history, it may be recommended that you also have a yearly breast X-ray (mammogram).  If you have a family history of breast cancer, talk with your health care provider about genetic screening.  If you are at high risk for breast cancer, talk with your health care provider about having an MRI and a mammogram every year.  Breast cancer (BRCA) gene test is recommended for women who have family members with BRCA-related cancers. Results of the assessment will determine the need for genetic counseling and BRCA1 and for BRCA2 testing. BRCA-related cancers include these  types: ? Breast. This occurs in males or females. ? Ovarian. ? Tubal. This may also be called fallopian tube cancer. ? Cancer of the abdominal or pelvic lining (peritoneal cancer). ? Prostate. ? Pancreatic. Cervical, Uterine, and Ovarian Cancer Your health care provider may recommend that you be screened regularly for cancer of the pelvic organs. These include your ovaries, uterus, and vagina. This screening involves a pelvic exam, which includes checking for microscopic changes to the surface of your cervix (Pap test).  For women ages 21-65, health care providers may recommend a pelvic exam and a Pap test every three years. For women ages 47-65, they may recommend the Pap test and pelvic exam, combined with testing for human papilloma virus (HPV), every five years. Some types of HPV increase your risk of cervical cancer. Testing for HPV may also be done on women of any age who have unclear Pap test results.  Other health care providers may not recommend any screening for nonpregnant women who are considered low risk for pelvic cancer and have no symptoms. Ask your health care provider if a screening pelvic exam is right for you.  If you have had past treatment for cervical cancer or a condition that could lead to cancer, you need Pap tests and screening for cancer for at least 20 years after your treatment. If Pap tests have been discontinued for you, your risk factors (such as having a new sexual partner) need to be reassessed to determine if you should start having screenings again. Some women have medical problems that increase the chance of getting cervical cancer. In these cases, your health care provider may recommend that you have screening and Pap tests more often.  If you have a family history of uterine cancer or ovarian cancer, talk with your health care provider about genetic screening.  If you have vaginal bleeding after reaching menopause, tell your health care provider.  There are  currently no reliable tests available to screen for ovarian cancer. Lung Cancer Lung cancer screening is recommended for adults 67-34 years old who are at high risk for lung cancer because of a history of smoking. A yearly low-dose CT scan of the lungs is recommended if you:  Currently smoke.  Have a history of at least 30 pack-years of smoking and you currently smoke or have quit within the past 15 years. A pack-year is smoking an average of one pack of cigarettes per day for one year. Yearly screening should:  Continue until it has been 15 years since you quit.  Stop if you develop a health problem  that would prevent you from having lung cancer treatment. Colorectal Cancer  This type of cancer can be detected and can often be prevented.  Routine colorectal cancer screening usually begins at age 34 and continues through age 61.  If you have risk factors for colon cancer, your health care provider may recommend that you be screened at an earlier age.  If you have a family history of colorectal cancer, talk with your health care provider about genetic screening.  Your health care provider may also recommend using home test kits to check for hidden blood in your stool.  A small camera at the end of a tube can be used to examine your colon directly (sigmoidoscopy or colonoscopy). This is done to check for the earliest forms of colorectal cancer.  Direct examination of the colon should be repeated every 5-10 years until age 9. However, if early forms of precancerous polyps or small growths are found or if you have a family history or genetic risk for colorectal cancer, you may need to be screened more often. Skin Cancer  Check your skin from head to toe regularly.  Monitor any moles. Be sure to tell your health care provider: ? About any new moles or changes in moles, especially if there is a change in a mole's shape or color. ? If you have a mole that is larger than the size of a  pencil eraser.  If any of your family members has a history of skin cancer, especially at a young age, talk with your health care provider about genetic screening.  Always use sunscreen. Apply sunscreen liberally and repeatedly throughout the day.  Whenever you are outside, protect yourself by wearing long sleeves, pants, a wide-brimmed hat, and sunglasses. What should I know about osteoporosis? Osteoporosis is a condition in which bone destruction happens more quickly than new bone creation. After menopause, you may be at an increased risk for osteoporosis. To help prevent osteoporosis or the bone fractures that can happen because of osteoporosis, the following is recommended:  If you are 22-23 years old, get at least 1,000 mg of calcium and at least 600 mg of vitamin D per day.  If you are older than age 24 but younger than age 68, get at least 1,200 mg of calcium and at least 600 mg of vitamin D per day.  If you are older than age 14, get at least 1,200 mg of calcium and at least 800 mg of vitamin D per day. Smoking and excessive alcohol intake increase the risk of osteoporosis. Eat foods that are rich in calcium and vitamin D, and do weight-bearing exercises several times each week as directed by your health care provider. What should I know about how menopause affects my mental health? Depression may occur at any age, but it is more common as you become older. Common symptoms of depression include:  Low or sad mood.  Changes in sleep patterns.  Changes in appetite or eating patterns.  Feeling an overall lack of motivation or enjoyment of activities that you previously enjoyed.  Frequent crying spells. Talk with your health care provider if you think that you are experiencing depression. What should I know about immunizations? It is important that you get and maintain your immunizations. These include:  Tetanus, diphtheria, and pertussis (Tdap) booster vaccine.  Influenza every  year before the flu season begins.  Pneumonia vaccine.  Shingles vaccine. Your health care provider may also recommend other immunizations. This information is not intended  to replace advice given to you by your health care provider. Make sure you discuss any questions you have with your health care provider. Document Released: 12/04/2005 Document Revised: 05/01/2016 Document Reviewed: 07/16/2015 Elsevier Interactive Patient Education  2019 [employer].

## 2018-12-15 NOTE — Progress Notes (Signed)
ANNUAL PREVENTATIVE CARE GYNECOLOGY  ENCOUNTER NOTE  Subjective:       Meredith Cooper is a 63 y.o. G30P1001 female here for a routine annual gynecologic exam. She is transitioning care from Dr. Greggory Keen who has retired.The patient is not sexually active. The patient is taking hormone replacement therapy. Patient denies post-menopausal vaginal bleeding. The patient wears seatbelts: yes. The patient participates in regular exercise: no. Has the patient ever been transfused or tattooed?: no. The patient reports that there is not domestic violence in her life.  Current complaints: 1.  Needs to have pessary reinserted.  Currently using pessary for incomplete bladder emptying and Grade 3 cystocele. Notes that it "comes out from time to time" and has to get it reinserted. Denies any issues with the pessary otherwise.    Gynecologic History No LMP recorded. Patient has had a hysterectomy. Contraception: status post hysterectomy Last Pap: in 2000 prior to hysterectomy. Results were: normal Last mammogram: 12/13/2018 (performed at [facility]). Results were: normal Last Colonoscopy: Patient has not had. Performs yearly stool cards.  Last Dexa Scan: Patient has not had PCP: Dr. Carren Rang, [facility]   Obstetric History OB History  Gravida Para Term Preterm AB Living  1 1 1     1   SAB TAB Ectopic Multiple Live Births          1    # Outcome Date GA Lbr Len/2nd Weight Sex Delivery Anes PTL Lv  1 Term 1973   7 lb 11.2 oz (3.493 kg) F VBAC   LIV    Past Medical History:  Diagnosis Date  . AR (allergic rhinitis)   . GERD (gastroesophageal reflux disease)   . Hiatal hernia   . Hyperlipemia   . Hypertension   . Thyroid disease   . Vaginal prolapse     Family History  Problem Relation Age of Onset  . Bone cancer Mother   . Bladder Cancer Sister   . Colon cancer Sister   . Diabetes Brother   . Ovarian cancer Maternal Grandmother   . Heart disease Neg Hx   . Breast  cancer Neg Hx     Past Surgical History:  Procedure Laterality Date  . ABDOMINAL HYSTERECTOMY  2000   [place] [facility] [facility]  . BREAST BIOPSY    . VEIN LIGATION AND STRIPPING      Social History   Socioeconomic History  . Marital status: Married    Spouse name: Not on file  . Number of children: Not on file  . Years of education: Not on file  . Highest education level: Not on file  Occupational History  . Not on file  Social Needs  . Financial resource strain: Not on file  . Food insecurity:    Worry: Not on file    Inability: Not on file  . Transportation needs:    Medical: Not on file    Non-medical: Not on file  Tobacco Use  . Smoking status: Never Smoker  . Smokeless tobacco: Never Used  Substance and Sexual Activity  . Alcohol use: No  . Drug use: No  . Sexual activity: Not Currently    Birth control/protection: Surgical  Lifestyle  . Physical activity:    Days per week: Not on file    Minutes per session: Not on file  . Stress: Not on file  Relationships  . Social connections:    Talks on phone: Not on file    Gets together: Not on  file    Attends religious service: Not on file    Active member of club or organization: Not on file    Attends meetings of clubs or organizations: Not on file    Relationship status: Not on file  . Intimate partner violence:    Fear of current or ex partner: Not on file    Emotionally abused: Not on file    Physically abused: Not on file    Forced sexual activity: Not on file  Other Topics Concern  . Not on file  Social History Narrative  . Not on file    Current Outpatient Medications on File Prior to Visit  Medication Sig Dispense Refill  . azelastine (ASTELIN) 0.1 % nasal spray Place into both nostrils 2 (two) times daily. Use in each nostril as directed    . conjugated estrogens (PREMARIN) vaginal cream Insert 1 g intravaginal weekly 30 g 3  . estradiol (ESTRACE) 1 MG tablet Take 1 tablet (1 mg total) by mouth daily. 30  tablet 6  . fluticasone (FLONASE) 50 MCG/ACT nasal spray Place 2 sprays into both nostrils daily.     Marland Kitchen. levocetirizine (XYZAL) 5 MG tablet Take 5 mg by mouth every evening.    Marland Kitchen. lisinopril (PRINIVIL,ZESTRIL) 10 MG tablet     . montelukast (SINGULAIR) 10 MG tablet Take 10 mg by mouth at bedtime.    Dani Gobble. OXYQUINOLONE SULFATE VAGINAL (TRIMO-SAN) 0.025 % GEL Place 1 g vaginally once a week. 113.4 g 2  . pravastatin (PRAVACHOL) 20 MG tablet Take 20 mg by mouth daily.    Marland Kitchen. TRIMO-SAN 0.025-0.01 % GEL      No current facility-administered medications on file prior to visit.     Allergies  Allergen Reactions  . Penicillins Itching and Rash      Review of Systems ROS Review of Systems - General ROS: negative for - chills, fatigue, fever, hot flashes, night sweats, weight gain or weight loss Psychological ROS: negative for - anxiety, decreased libido, depression, mood swings, physical abuse or sexual abuse Ophthalmic ROS: negative for - blurry vision, eye pain or loss of vision ENT ROS: negative for - headaches, hearing change, visual changes or vocal changes Allergy and Immunology ROS: negative for - hives, itchy/watery eyes or seasonal allergies Hematological and Lymphatic ROS: negative for - bleeding problems, bruising, swollen lymph nodes or weight loss Endocrine ROS: negative for - galactorrhea, hair pattern changes, hot flashes, malaise/lethargy, mood swings, palpitations, polydipsia/polyuria, skin changes, temperature intolerance or unexpected weight changes Breast ROS: negative for - new or changing breast lumps or nipple discharge Respiratory ROS: negative for - cough or shortness of breath Cardiovascular ROS: negative for - chest pain, irregular heartbeat, palpitations or shortness of breath Gastrointestinal ROS: no abdominal pain, change in bowel habits, or black or bloody stools Genito-Urinary ROS: no dysuria, trouble voiding, or hematuria Musculoskeletal ROS: negative for - joint pain  or joint stiffness Neurological ROS: negative for - bowel and bladder control changes Dermatological ROS: negative for rash and skin lesion changes   Objective:   BP (!) 160/84   Pulse 74   Ht 5\' 5"  (1.651 m)   Wt 222 lb 1.6 oz (100.7 kg)   BMI 36.96 kg/m  CONSTITUTIONAL: Well-developed, well-nourished female in no acute distress. Moderate obesity PSYCHIATRIC: Normal mood and affect. Normal behavior. Normal judgment and thought content. NEUROLGIC: Alert and oriented to person, place, and time. Normal muscle tone coordination. No cranial nerve deficit noted. HENT:  Normocephalic, atraumatic, External right and  left ear normal. Oropharynx is clear and moist EYES: Conjunctivae and EOM are normal. Pupils are equal, round, and reactive to light. No scleral icterus.  NECK: Normal range of motion, supple, no masses.  Normal thyroid.  SKIN: Skin is warm and dry. No rash noted. Not diaphoretic. No erythema. No pallor. CARDIOVASCULAR: Normal heart rate noted, regular rhythm, no murmur. RESPIRATORY: Clear to auscultation bilaterally. Effort and breath sounds normal, no problems with respiration noted. BREASTS: Symmetric in size. No masses, skin changes, nipple drainage, or lymphadenopathy. ABDOMEN: Soft, normal bowel sounds, no distention noted.  No tenderness, rebound or guarding.  BLADDER: Normal PELVIC:  Bladder no bladder distension noted  Urethra: normal appearing urethra with no masses, tenderness or lesions  Vulva: normal appearing vulva with no masses, tenderness or lesions  Vagina: normal appearing mucosa, no lesions or discharge. Grade 3 cystocele.    Cervix: surgically absent  Uterus: surgically absent, vaginal cuff well healed  Adnexa: surgically absent bilateral  RV: External Exam NormaI, No Rectal Masses and Normal Sphincter tone  MUSCULOSKELETAL: Normal range of motion. No tenderness.  No cyanosis, clubbing, or edema.  2+ distal pulses. LYMPHATIC: No Axillary, Supraclavicular,  or Inguinal Adenopathy.   Labs: No results found for: WBC, HGB, HCT, MCV, PLT  No results found for: CREATININE, BUN, NA, K, CL, CO2  No results found for: ALT, AST, GGT, ALKPHOS, BILITOT  No results found for: CHOL, HDL, LDLCALC, LDLDIRECT, TRIG, CHOLHDL  No results found for: TSH  No results found for: HGBA1C   Assessment:   Encounter for annual routine gynecological examination Hyperlipidemia, unspecified hyperlipidemia type Essential hypertension Menopause on HRT Baden-Walker grade 3 cystocele Incomplete bladder emptying Pessary maintenance Moderate obesity  Plan:  - Pap: Not needed.  Patient is s/p hysterectomy - Mammogram: Ordered - Stool Guaiac Testing:  Ordered usually by PCP. - Labs: To be ordered by PCP. Patient has an upcoming appointment next week.  - Routine preventative health maintenance measures emphasized: Exercise/Diet/Weight control, Alcohol/Substance use risks and Stress Management - Advised on initiation of Vitamin D and calcium supplementation - Menopause on HRT, desires refill on medications. She  is advised that she may wish to discontinue the HRT at any time, and encouraged to discuss this with providers yearly. Her personal risk factors have been reviewed carefully with her today. Refill given.  - Size 2/1/2 Gelhorn pessary placed today for management of cystocele and incomplete bladder emptying.  - Hypertension and hyperlipidemia managed by PCP. - Patient will be due for bone density scan this year.  Can order today or can have ordered by PCP. Patient notes she will ask PCP.  - Flu vaccine up to date, received at job in November 2019.  - Return to Clinic - 1 Year   Hildred Laser, MD  [facility]

## 2018-12-17 MED ORDER — ESTRADIOL 1 MG PO TABS: 1.0000 mg | ORAL_TABLET | Freq: Every day | ORAL | 6 refills | Status: DC

## 2018-12-17 MED ORDER — ESTROGENS, CONJUGATED 0.625 MG/GM VA CREA: TOPICAL_CREAM | VAGINAL | 3 refills | Status: AC

## 2018-12-18 ENCOUNTER — Encounter: Payer: Self-pay | Admitting: Obstetrics and Gynecology

## 2018-12-18 DIAGNOSIS — Z79890 Hormone replacement therapy (postmenopausal): Secondary | ICD-10-CM | POA: Insufficient documentation

## 2018-12-18 DIAGNOSIS — R339 Retention of urine, unspecified: Secondary | ICD-10-CM | POA: Insufficient documentation

## 2018-12-18 DIAGNOSIS — E669 Obesity, unspecified: Secondary | ICD-10-CM | POA: Insufficient documentation

## 2018-12-18 DIAGNOSIS — I10 Essential (primary) hypertension: Secondary | ICD-10-CM | POA: Insufficient documentation

## 2018-12-18 DIAGNOSIS — E785 Hyperlipidemia, unspecified: Secondary | ICD-10-CM | POA: Insufficient documentation

## 2019-05-16 ENCOUNTER — Other Ambulatory Visit: Payer: Self-pay | Admitting: Obstetrics and Gynecology

## 2019-12-01 ENCOUNTER — Other Ambulatory Visit: Payer: Self-pay

## 2019-12-01 MED ORDER — ESTRADIOL 1 MG PO TABS: 1.0000 mg | ORAL_TABLET | Freq: Every day | ORAL | 6 refills | Status: AC

## 2019-12-05 ENCOUNTER — Ambulatory Visit: Payer: Medicare Other | Attending: Internal Medicine

## 2019-12-05 DIAGNOSIS — Z23 Encounter for immunization: Secondary | ICD-10-CM | POA: Insufficient documentation

## 2019-12-05 NOTE — Progress Notes (Signed)
   Covid-19 Vaccination Clinic  Name:  Navya Timmons    MRN: [id number] DOB: 1956-07-17  12/05/2019  Ms. Shetterly was observed post Covid-19 immunization for 15 minutes without incidence. She was provided with Vaccine Information Sheet and instruction to access the V-Safe system.   Ms. Morell was instructed to call 911 with any severe reactions post vaccine: Marland Kitchen Difficulty breathing  . Swelling of your face and throat  . A fast heartbeat  . A bad rash all over your body  . Dizziness and weakness    Immunizations Administered    Name Date Dose VIS Date Route   Pfizer COVID-19 Vaccine 12/05/2019  3:34 PM 0.3 mL 10/06/2019 Intramuscular   Manufacturer: [employer], [employer]   Lot: [id number]   NDC: [address]-9

## 2019-12-20 ENCOUNTER — Other Ambulatory Visit: Payer: Self-pay

## 2019-12-20 ENCOUNTER — Encounter: Payer: Self-pay | Admitting: Obstetrics and Gynecology

## 2019-12-20 ENCOUNTER — Ambulatory Visit (INDEPENDENT_AMBULATORY_CARE_PROVIDER_SITE_OTHER): Payer: Medicare Other | Admitting: Obstetrics and Gynecology

## 2019-12-20 VITALS — BP 126/74 | HR 75 | Ht 65.0 in | Wt 238.5 lb

## 2019-12-20 DIAGNOSIS — Z1231 Encounter for screening mammogram for malignant neoplasm of breast: Secondary | ICD-10-CM

## 2019-12-20 DIAGNOSIS — Z01419 Encounter for gynecological examination (general) (routine) without abnormal findings: Secondary | ICD-10-CM

## 2019-12-20 DIAGNOSIS — Z90710 Acquired absence of both cervix and uterus: Secondary | ICD-10-CM

## 2019-12-20 DIAGNOSIS — N816 Rectocele: Secondary | ICD-10-CM

## 2019-12-20 DIAGNOSIS — E669 Obesity, unspecified: Secondary | ICD-10-CM

## 2019-12-20 DIAGNOSIS — Z808 Family history of malignant neoplasm of other organs or systems: Secondary | ICD-10-CM | POA: Diagnosis not present

## 2019-12-20 DIAGNOSIS — Z13820 Encounter for screening for osteoporosis: Secondary | ICD-10-CM

## 2019-12-20 DIAGNOSIS — I10 Essential (primary) hypertension: Secondary | ICD-10-CM

## 2019-12-20 DIAGNOSIS — N8110 Cystocele, unspecified: Secondary | ICD-10-CM

## 2019-12-20 NOTE — Patient Instructions (Signed)

## 2019-12-20 NOTE — Progress Notes (Signed)
ANNUAL PREVENTATIVE CARE GYNECOLOGY  ENCOUNTER NOTE  Subjective:       Meredith Cooper is a 64 y.o. G24P1001 female here for a routine annual gynecologic exam. The patient is not sexually active. The patient is taking hormone replacement therapy. Patient denies post-menopausal vaginal bleeding. She also has a history of midline cystocele with rectocele/enterocele.The patient wears seatbelts: yes. The patient participates in regular exercise: no.   Current complaints: 1. Notes her pessary came out shortly after re-insertion last year. Has been doing pretty well without it since she is no longer working (retired). Does not desire reinsertion or refitting at this time.    Gynecologic History No LMP recorded. Patient has had a hysterectomy. Contraception: status post hysterectomy.  Last Pap: in 2000 prior to hysterectomy. Results were: normal Last mammogram: 2//2021 (performed at [facility]). Results were: normal (noted fibrous tissue).  Last Colonoscopy: Patient has not had. Performs yearly stool cards. Last screen 11/2017, negative.  Last Dexa Scan: Patient has not had one.  PCP: Dr. Wayland Denis, [facility]   Obstetric History OB History  Gravida Para Term Preterm AB Living  1 1 1     1   SAB TAB Ectopic Multiple Live Births          1    # Outcome Date GA Lbr Len/2nd Weight Sex Delivery Anes PTL Lv  1 Term 1973   7 lb 11.2 oz (3.493 kg) F VBAC   LIV    Past Medical History:  Diagnosis Date  . AR (allergic rhinitis)   . GERD (gastroesophageal reflux disease)   . Hiatal hernia   . Hyperlipemia   . Hypertension   . Thyroid disease   . Vaginal prolapse     Family History  Problem Relation Age of Onset  . Bone cancer Mother   . Bladder Cancer Sister   . Colon cancer Sister   . Diabetes Brother   . Ovarian cancer Maternal Grandmother   . Heart disease Neg Hx   . Breast cancer Neg Hx     Past Surgical History:  Procedure Laterality Date  . ABDOMINAL  HYSTERECTOMY  2000   [place]so- bvd  . BREAST BIOPSY    . VEIN LIGATION AND STRIPPING      Social History   Socioeconomic History  . Marital status: Married    Spouse name: Not on file  . Number of children: Not on file  . Years of education: Not on file  . Highest education level: Not on file  Occupational History  . Not on file  Tobacco Use  . Smoking status: Never Smoker  . Smokeless tobacco: Never Used  Substance and Sexual Activity  . Alcohol use: No  . Drug use: No  . Sexual activity: Not Currently    Birth control/protection: Surgical  Other Topics Concern  . Not on file  Social History Narrative  . Not on file   Social Determinants of Health   Financial Resource Strain:   . Difficulty of Paying Living Expenses: Not on file  Food Insecurity:   . Worried About Charity fundraiser in the Last Year: Not on file  . Ran Out of Food in the Last Year: Not on file  Transportation Needs:   . Lack of Transportation (Medical): Not on file  . Lack of Transportation (Non-Medical): Not on file  Physical Activity:   . Days of Exercise per Week: Not on file  . Minutes of Exercise per Session:  Not on file  Stress:   . Feeling of Stress : Not on file  Social Connections:   . Frequency of Communication with Friends and Family: Not on file  . Frequency of Social Gatherings with Friends and Family: Not on file  . Attends Religious Services: Not on file  . Active Member of Clubs or Organizations: Not on file  . Attends Banker Meetings: Not on file  . Marital Status: Not on file  Intimate Partner Violence:   . Fear of Current or Ex-Partner: Not on file  . Emotionally Abused: Not on file  . Physically Abused: Not on file  . Sexually Abused: Not on file    Current Outpatient Medications on File Prior to Visit  Medication Sig Dispense Refill  . azelastine (ASTELIN) 0.1 % nasal spray Place into both nostrils 2 (two) times daily. Use in each nostril as directed      . conjugated estrogens (PREMARIN) vaginal cream Insert 1 g intravaginal weekly 30 g 3  . estradiol (ESTRACE) 1 MG tablet Take 1 tablet (1 mg total) by mouth daily. 30 tablet 6  . fluticasone (FLONASE) 50 MCG/ACT nasal spray Place 2 sprays into both nostrils daily.     Marland Kitchen levocetirizine (XYZAL) 5 MG tablet Take 5 mg by mouth every evening.    Marland Kitchen lisinopril (PRINIVIL,ZESTRIL) 10 MG tablet     . montelukast (SINGULAIR) 10 MG tablet Take 10 mg by mouth at bedtime.    Dani Gobble SULFATE VAGINAL (TRIMO-SAN) 0.025 % GEL Place 1 g vaginally once a week. 113.4 g 2  . pravastatin (PRAVACHOL) 20 MG tablet Take 20 mg by mouth daily.    Marland Kitchen TRIMO-SAN 0.025-0.01 % GEL      No current facility-administered medications on file prior to visit.    Allergies  Allergen Reactions  . Penicillins Itching and Rash      Review of Systems ROS Review of Systems - General ROS: negative for - chills, fatigue, fever, hot flashes, night sweats, weight gain or weight loss Psychological ROS: negative for - anxiety, decreased libido, depression, mood swings, physical abuse or sexual abuse Ophthalmic ROS: negative for - blurry vision, eye pain or loss of vision ENT ROS: negative for - headaches, hearing change, visual changes or vocal changes Allergy and Immunology ROS: negative for - hives, itchy/watery eyes or seasonal allergies Hematological and Lymphatic ROS: negative for - bleeding problems, bruising, swollen lymph nodes or weight loss Endocrine ROS: negative for - galactorrhea, hair pattern changes, hot flashes, malaise/lethargy, mood swings, palpitations, polydipsia/polyuria, skin changes, temperature intolerance or unexpected weight changes Breast ROS: negative for - new or changing breast lumps or nipple discharge Respiratory ROS: negative for - cough or shortness of breath Cardiovascular ROS: negative for - chest pain, irregular heartbeat, palpitations or shortness of breath Gastrointestinal ROS: no  abdominal pain, change in bowel habits, or black or bloody stools Genito-Urinary ROS: no dysuria, trouble voiding, or hematuria Musculoskeletal ROS: negative for - joint pain or joint stiffness Neurological ROS: negative for - bowel and bladder control changes Dermatological ROS: negative for rash and skin lesion changes   Objective:   Pulse 75   Ht 5\' 5"  (1.651 m)   Wt 238 lb 8 oz (108.2 kg)   BMI 39.69 kg/m  CONSTITUTIONAL: Well-developed, well-nourished female in no acute distress. Moderate obesity PSYCHIATRIC: Normal mood and affect. Normal behavior. Normal judgment and thought content. NEUROLGIC: Alert and oriented to person, place, and time. Normal muscle tone coordination. No cranial nerve  deficit noted. HENT:  Normocephalic, atraumatic, External right and left ear normal. Oropharynx is clear and moist EYES: Conjunctivae and EOM are normal. Pupils are equal, round, and reactive to light. No scleral icterus.  NECK: Normal range of motion, supple, no masses.  Normal thyroid.  SKIN: Skin is warm and dry. No rash noted. Not diaphoretic. No erythema. No pallor. CARDIOVASCULAR: Normal heart rate noted, regular rhythm, no murmur. RESPIRATORY: Clear to auscultation bilaterally. Effort and breath sounds normal, no problems with respiration noted. BREASTS: Symmetric in size. No masses, skin changes, nipple drainage, or lymphadenopathy. ABDOMEN: Soft, normal bowel sounds, no distention noted.  No tenderness, rebound or guarding.  BLADDER: Normal PELVIC:  Bladder no bladder distension noted  Urethra: normal appearing urethra with no masses, tenderness or lesions  Vulva: normal appearing vulva with no masses, tenderness or lesions  Vagina: normal appearing mucosa, no lesions or discharge. Grade 3 cystocele, Grade 1-2 rectocele.    Cervix: surgically absent  Uterus: surgically absent, vaginal cuff well healed  Adnexa: surgically absent bilateral  RV: External Exam NormaI, No Rectal Masses  and Normal Sphincter tone  MUSCULOSKELETAL: Normal range of motion. No tenderness.  No cyanosis, clubbing, or edema.  2+ distal pulses. LYMPHATIC: No Axillary, Supraclavicular, or Inguinal Adenopathy.   Labs: No results found for: WBC, HGB, HCT, MCV, PLT  No results found for: CREATININE, BUN, NA, K, CL, CO2  No results found for: ALT, AST, GGT, ALKPHOS, BILITOT  No results found for: CHOL, HDL, LDLCALC, LDLDIRECT, TRIG, CHOLHDL  No results found for: TSH  No results found for: HGBA1C   Assessment:   Encounter for annual routine gynecological examination Hyperlipidemia, unspecified hyperlipidemia type Essential hypertension Menopause on HRT Baden-Walker grade 3 cystocele Pessary maintenance Moderate obesity  Plan:  - Pap: Not needed.  Patient is s/p hysterectomy - Mammogram: Ordered - Stool Guaiac Testing:  Ordered usually by PCP. - Labs: To be ordered by PCP. Patient has an upcoming appointment next week.   - Dexa scan ordered.  - Routine preventative health maintenance measures emphasized: Exercise/Diet/Weight control, Alcohol/Substance use risks and Stress Management - Advised on initiation of Vitamin D and calcium supplementation - Menopause on HRT, desires refill on medications. She is advised that she may wish to discontinue the HRT at any time, and encouraged to discuss this with providers yearly. Her personal risk factors have been reviewed carefully with her today. Refill given.  - Cystocele and rectocele with no interventions currently. Patient overall managing without issues.   - Hypertension and hyperlipidemia managed by PCP. - Patient doing well with prolapse, no symptoms  - Flu vaccine up to date.  - Has received first of COVID vaccination series.  - Return to Clinic - 1 Year   Hildred Laser, MD  [facility]

## 2019-12-20 NOTE — Progress Notes (Signed)
Pt is present today for annual exam. Pt stated that she was doing well no problems.  

## 2019-12-30 ENCOUNTER — Ambulatory Visit: Payer: Medicare Other | Attending: Internal Medicine

## 2019-12-30 DIAGNOSIS — Z23 Encounter for immunization: Secondary | ICD-10-CM | POA: Insufficient documentation

## 2019-12-30 NOTE — Progress Notes (Signed)
   Covid-19 Vaccination Clinic  Name:  Meredith Cooper    MRN: [id number] DOB: 1956-06-29  12/30/2019  Meredith Cooper was observed post Covid-19 immunization for 15 minutes without incident. She was provided with Vaccine Information Sheet and instruction to access the V-Safe system.   Meredith Cooper was instructed to call 911 with any severe reactions post vaccine: Marland Kitchen Difficulty breathing  . Swelling of face and throat  . A fast heartbeat  . A bad rash all over body  . Dizziness and weakness   Immunizations Administered    Name Date Dose VIS Date Route   Pfizer COVID-19 Vaccine 12/30/2019 10:14 AM 0.3 mL 10/06/2019 Intramuscular   Manufacturer: [employer], [employer]   Lot: [id number]   NDC: [address]-9

## 2020-02-09 ENCOUNTER — Encounter (HOSPITAL_COMMUNITY): Payer: Self-pay | Admitting: Neurology

## 2020-02-09 ENCOUNTER — Inpatient Hospital Stay (HOSPITAL_COMMUNITY)
Admission: EM | Admit: 2020-02-09 | Discharge: 2020-03-26 | DRG: 023 | Disposition: E | Payer: Medicare Other | Attending: Neurology | Admitting: Neurology

## 2020-02-09 ENCOUNTER — Emergency Department (HOSPITAL_COMMUNITY): Payer: Medicare Other

## 2020-02-09 ENCOUNTER — Inpatient Hospital Stay (HOSPITAL_COMMUNITY): Payer: Medicare Other

## 2020-02-09 DIAGNOSIS — J9621 Acute and chronic respiratory failure with hypoxia: Secondary | ICD-10-CM | POA: Diagnosis not present

## 2020-02-09 DIAGNOSIS — R9401 Abnormal electroencephalogram [EEG]: Secondary | ICD-10-CM | POA: Diagnosis not present

## 2020-02-09 DIAGNOSIS — I161 Hypertensive emergency: Secondary | ICD-10-CM | POA: Diagnosis not present

## 2020-02-09 DIAGNOSIS — E876 Hypokalemia: Secondary | ICD-10-CM

## 2020-02-09 DIAGNOSIS — K117 Disturbances of salivary secretion: Secondary | ICD-10-CM | POA: Diagnosis present

## 2020-02-09 DIAGNOSIS — G932 Benign intracranial hypertension: Secondary | ICD-10-CM | POA: Diagnosis present

## 2020-02-09 DIAGNOSIS — Z0184 Encounter for antibody response examination: Secondary | ICD-10-CM | POA: Diagnosis not present

## 2020-02-09 DIAGNOSIS — W19XXXA Unspecified fall, initial encounter: Secondary | ICD-10-CM | POA: Diagnosis present

## 2020-02-09 DIAGNOSIS — J95851 Ventilator associated pneumonia: Secondary | ICD-10-CM | POA: Diagnosis not present

## 2020-02-09 DIAGNOSIS — R1310 Dysphagia, unspecified: Secondary | ICD-10-CM | POA: Diagnosis present

## 2020-02-09 DIAGNOSIS — J939 Pneumothorax, unspecified: Secondary | ICD-10-CM

## 2020-02-09 DIAGNOSIS — I10 Essential (primary) hypertension: Secondary | ICD-10-CM | POA: Diagnosis present

## 2020-02-09 DIAGNOSIS — Z90722 Acquired absence of ovaries, bilateral: Secondary | ICD-10-CM

## 2020-02-09 DIAGNOSIS — Y92239 Unspecified place in hospital as the place of occurrence of the external cause: Secondary | ICD-10-CM | POA: Diagnosis not present

## 2020-02-09 DIAGNOSIS — E870 Hyperosmolality and hypernatremia: Secondary | ICD-10-CM | POA: Diagnosis not present

## 2020-02-09 DIAGNOSIS — Z01818 Encounter for other preprocedural examination: Secondary | ICD-10-CM

## 2020-02-09 DIAGNOSIS — R509 Fever, unspecified: Secondary | ICD-10-CM

## 2020-02-09 DIAGNOSIS — J942 Hemothorax: Secondary | ICD-10-CM

## 2020-02-09 DIAGNOSIS — I609 Nontraumatic subarachnoid hemorrhage, unspecified: Secondary | ICD-10-CM | POA: Diagnosis present

## 2020-02-09 DIAGNOSIS — I480 Paroxysmal atrial fibrillation: Secondary | ICD-10-CM | POA: Diagnosis present

## 2020-02-09 DIAGNOSIS — E46 Unspecified protein-calorie malnutrition: Secondary | ICD-10-CM | POA: Diagnosis present

## 2020-02-09 DIAGNOSIS — G8191 Hemiplegia, unspecified affecting right dominant side: Secondary | ICD-10-CM | POA: Diagnosis present

## 2020-02-09 DIAGNOSIS — Z8041 Family history of malignant neoplasm of ovary: Secondary | ICD-10-CM

## 2020-02-09 DIAGNOSIS — R29810 Facial weakness: Secondary | ICD-10-CM | POA: Diagnosis present

## 2020-02-09 DIAGNOSIS — Z5309 Procedure and treatment not carried out because of other contraindication: Secondary | ICD-10-CM | POA: Diagnosis not present

## 2020-02-09 DIAGNOSIS — R4701 Aphasia: Secondary | ICD-10-CM | POA: Diagnosis present

## 2020-02-09 DIAGNOSIS — K219 Gastro-esophageal reflux disease without esophagitis: Secondary | ICD-10-CM | POA: Diagnosis present

## 2020-02-09 DIAGNOSIS — D72829 Elevated white blood cell count, unspecified: Secondary | ICD-10-CM | POA: Diagnosis not present

## 2020-02-09 DIAGNOSIS — Z90710 Acquired absence of both cervix and uterus: Secondary | ICD-10-CM

## 2020-02-09 DIAGNOSIS — Z978 Presence of other specified devices: Secondary | ICD-10-CM | POA: Diagnosis not present

## 2020-02-09 DIAGNOSIS — I615 Nontraumatic intracerebral hemorrhage, intraventricular: Secondary | ICD-10-CM | POA: Diagnosis present

## 2020-02-09 DIAGNOSIS — I6389 Other cerebral infarction: Secondary | ICD-10-CM | POA: Diagnosis not present

## 2020-02-09 DIAGNOSIS — I4892 Unspecified atrial flutter: Secondary | ICD-10-CM | POA: Diagnosis not present

## 2020-02-09 DIAGNOSIS — G936 Cerebral edema: Secondary | ICD-10-CM

## 2020-02-09 DIAGNOSIS — Z9689 Presence of other specified functional implants: Secondary | ICD-10-CM

## 2020-02-09 DIAGNOSIS — T85628A Displacement of other specified internal prosthetic devices, implants and grafts, initial encounter: Secondary | ICD-10-CM | POA: Diagnosis not present

## 2020-02-09 DIAGNOSIS — E039 Hypothyroidism, unspecified: Secondary | ICD-10-CM | POA: Diagnosis present

## 2020-02-09 DIAGNOSIS — Z79899 Other long term (current) drug therapy: Secondary | ICD-10-CM

## 2020-02-09 DIAGNOSIS — E8770 Fluid overload, unspecified: Secondary | ICD-10-CM | POA: Diagnosis not present

## 2020-02-09 DIAGNOSIS — Z79890 Hormone replacement therapy (postmenopausal): Secondary | ICD-10-CM

## 2020-02-09 DIAGNOSIS — Z9889 Other specified postprocedural states: Secondary | ICD-10-CM | POA: Diagnosis not present

## 2020-02-09 DIAGNOSIS — R6883 Chills (without fever): Secondary | ICD-10-CM | POA: Diagnosis not present

## 2020-02-09 DIAGNOSIS — Y848 Other medical procedures as the cause of abnormal reaction of the patient, or of later complication, without mention of misadventure at the time of the procedure: Secondary | ICD-10-CM | POA: Diagnosis not present

## 2020-02-09 DIAGNOSIS — Z4659 Encounter for fitting and adjustment of other gastrointestinal appliance and device: Secondary | ICD-10-CM

## 2020-02-09 DIAGNOSIS — I4891 Unspecified atrial fibrillation: Secondary | ICD-10-CM | POA: Diagnosis not present

## 2020-02-09 DIAGNOSIS — T783XXA Angioneurotic edema, initial encounter: Secondary | ICD-10-CM | POA: Diagnosis not present

## 2020-02-09 DIAGNOSIS — D62 Acute posthemorrhagic anemia: Secondary | ICD-10-CM | POA: Diagnosis not present

## 2020-02-09 DIAGNOSIS — I361 Nonrheumatic tricuspid (valve) insufficiency: Secondary | ICD-10-CM | POA: Diagnosis not present

## 2020-02-09 DIAGNOSIS — I471 Supraventricular tachycardia: Secondary | ICD-10-CM | POA: Diagnosis not present

## 2020-02-09 DIAGNOSIS — E785 Hyperlipidemia, unspecified: Secondary | ICD-10-CM | POA: Diagnosis present

## 2020-02-09 DIAGNOSIS — J690 Pneumonitis due to inhalation of food and vomit: Secondary | ICD-10-CM | POA: Diagnosis present

## 2020-02-09 DIAGNOSIS — Z800 Family history of malignant neoplasm of digestive organs: Secondary | ICD-10-CM

## 2020-02-09 DIAGNOSIS — Z6841 Body Mass Index (BMI) 40.0 and over, adult: Secondary | ICD-10-CM

## 2020-02-09 DIAGNOSIS — I6340 Cerebral infarction due to embolism of unspecified cerebral artery: Secondary | ICD-10-CM | POA: Diagnosis not present

## 2020-02-09 DIAGNOSIS — I63412 Cerebral infarction due to embolism of left middle cerebral artery: Secondary | ICD-10-CM | POA: Diagnosis present

## 2020-02-09 DIAGNOSIS — I69391 Dysphagia following cerebral infarction: Secondary | ICD-10-CM

## 2020-02-09 DIAGNOSIS — G9349 Other encephalopathy: Secondary | ICD-10-CM | POA: Diagnosis present

## 2020-02-09 DIAGNOSIS — E441 Mild protein-calorie malnutrition: Secondary | ICD-10-CM | POA: Diagnosis present

## 2020-02-09 DIAGNOSIS — Z780 Asymptomatic menopausal state: Secondary | ICD-10-CM

## 2020-02-09 DIAGNOSIS — J90 Pleural effusion, not elsewhere classified: Secondary | ICD-10-CM | POA: Diagnosis not present

## 2020-02-09 DIAGNOSIS — J9601 Acute respiratory failure with hypoxia: Secondary | ICD-10-CM | POA: Diagnosis present

## 2020-02-09 DIAGNOSIS — R569 Unspecified convulsions: Secondary | ICD-10-CM | POA: Diagnosis not present

## 2020-02-09 DIAGNOSIS — I619 Nontraumatic intracerebral hemorrhage, unspecified: Secondary | ICD-10-CM

## 2020-02-09 DIAGNOSIS — Z7189 Other specified counseling: Secondary | ICD-10-CM | POA: Diagnosis not present

## 2020-02-09 DIAGNOSIS — Z8052 Family history of malignant neoplasm of bladder: Secondary | ICD-10-CM

## 2020-02-09 DIAGNOSIS — Z452 Encounter for adjustment and management of vascular access device: Secondary | ICD-10-CM

## 2020-02-09 DIAGNOSIS — R739 Hyperglycemia, unspecified: Secondary | ICD-10-CM | POA: Diagnosis not present

## 2020-02-09 DIAGNOSIS — Z20822 Contact with and (suspected) exposure to covid-19: Secondary | ICD-10-CM | POA: Diagnosis present

## 2020-02-09 DIAGNOSIS — Z880 Allergy status to penicillin: Secondary | ICD-10-CM

## 2020-02-09 DIAGNOSIS — J9383 Other pneumothorax: Secondary | ICD-10-CM | POA: Diagnosis not present

## 2020-02-09 DIAGNOSIS — Z833 Family history of diabetes mellitus: Secondary | ICD-10-CM

## 2020-02-09 DIAGNOSIS — I071 Rheumatic tricuspid insufficiency: Secondary | ICD-10-CM | POA: Diagnosis present

## 2020-02-09 DIAGNOSIS — I611 Nontraumatic intracerebral hemorrhage in hemisphere, cortical: Secondary | ICD-10-CM | POA: Diagnosis present

## 2020-02-09 DIAGNOSIS — E7800 Pure hypercholesterolemia, unspecified: Secondary | ICD-10-CM | POA: Diagnosis not present

## 2020-02-09 DIAGNOSIS — Z66 Do not resuscitate: Secondary | ICD-10-CM | POA: Diagnosis not present

## 2020-02-09 DIAGNOSIS — R29734 NIHSS score 34: Secondary | ICD-10-CM | POA: Diagnosis present

## 2020-02-09 DIAGNOSIS — Z515 Encounter for palliative care: Secondary | ICD-10-CM | POA: Diagnosis not present

## 2020-02-09 DIAGNOSIS — H02843 Edema of right eye, unspecified eyelid: Secondary | ICD-10-CM | POA: Diagnosis not present

## 2020-02-09 DIAGNOSIS — I63423 Cerebral infarction due to embolism of bilateral anterior cerebral arteries: Secondary | ICD-10-CM | POA: Diagnosis present

## 2020-02-09 DIAGNOSIS — J9600 Acute respiratory failure, unspecified whether with hypoxia or hypercapnia: Secondary | ICD-10-CM | POA: Diagnosis not present

## 2020-02-09 DIAGNOSIS — Z8249 Family history of ischemic heart disease and other diseases of the circulatory system: Secondary | ICD-10-CM

## 2020-02-09 LAB — URINALYSIS, ROUTINE W REFLEX MICROSCOPIC
Bilirubin Urine: NEGATIVE
Glucose, UA: NEGATIVE mg/dL
Hgb urine dipstick: NEGATIVE
Ketones, ur: 20 mg/dL — AB
Leukocytes,Ua: NEGATIVE
Nitrite: NEGATIVE
Protein, ur: NEGATIVE mg/dL
Specific Gravity, Urine: 1.024 (ref 1.005–1.030)
pH: 7 (ref 5.0–8.0)

## 2020-02-09 LAB — COMPREHENSIVE METABOLIC PANEL
ALT: 19 U/L (ref 0–44)
AST: 25 U/L (ref 15–41)
Albumin: 3.4 g/dL — ABNORMAL LOW (ref 3.5–5.0)
Alkaline Phosphatase: 69 U/L (ref 38–126)
Anion gap: 11 (ref 5–15)
BUN: 17 mg/dL (ref 8–23)
CO2: 22 mmol/L (ref 22–32)
Calcium: 9 mg/dL (ref 8.9–10.3)
Chloride: 106 mmol/L (ref 98–111)
Creatinine, Ser: 0.75 mg/dL (ref 0.44–1.00)
GFR calc Af Amer: >60 mL/min (ref >60)
GFR calc non Af Amer: >60 mL/min (ref >60)
Glucose, Bld: 132 mg/dL — ABNORMAL HIGH (ref 70–99)
Potassium: 3.4 mmol/L — ABNORMAL LOW (ref 3.5–5.1)
Sodium: 139 mmol/L (ref 135–145)
Total Bilirubin: 0.7 mg/dL (ref 0.3–1.2)
Total Protein: 6.7 g/dL (ref 6.5–8.1)

## 2020-02-09 LAB — CBC
HCT: 35.5 % — ABNORMAL LOW (ref 36.0–46.0)
Hemoglobin: 10.8 g/dL — ABNORMAL LOW (ref 12.0–15.0)
MCH: 25.8 pg — ABNORMAL LOW (ref 26.0–34.0)
MCHC: 30.4 g/dL (ref 30.0–36.0)
MCV: 84.7 fL (ref 80.0–100.0)
Platelets: 307 10*3/uL (ref 150–400)
RBC: 4.19 MIL/uL (ref 3.87–5.11)
RDW: 13.8 % (ref 11.5–15.5)
WBC: 11.6 10*3/uL — ABNORMAL HIGH (ref 4.0–10.5)
nRBC: 0 % (ref 0.0–0.2)

## 2020-02-09 LAB — CBG MONITORING, ED: Glucose-Capillary: 127 mg/dL — ABNORMAL HIGH (ref 70–99)

## 2020-02-09 LAB — DIFFERENTIAL
Abs Immature Granulocytes: 0 10*3/uL (ref 0.00–0.07)
Basophils Absolute: 0 10*3/uL (ref 0.0–0.1)
Basophils Relative: 0 %
Eosinophils Absolute: 0.3 10*3/uL (ref 0.0–0.5)
Eosinophils Relative: 3 %
Lymphocytes Relative: 38 %
Lymphs Abs: 4.4 10*3/uL — ABNORMAL HIGH (ref 0.7–4.0)
Monocytes Absolute: 1 10*3/uL (ref 0.1–1.0)
Monocytes Relative: 9 %
Neutro Abs: 5.8 10*3/uL (ref 1.7–7.7)
Neutrophils Relative %: 50 %
nRBC: 0 /100 WBC

## 2020-02-09 LAB — POCT I-STAT 7, (LYTES, BLD GAS, ICA,H+H)
Acid-Base Excess: 1 mmol/L (ref 0.0–2.0)
Bicarbonate: 26.6 mmol/L (ref 20.0–28.0)
Calcium, Ion: 1.19 mmol/L (ref 1.15–1.40)
HCT: 30 % — ABNORMAL LOW (ref 36.0–46.0)
Hemoglobin: 10.2 g/dL — ABNORMAL LOW (ref 12.0–15.0)
O2 Saturation: 100 %
Potassium: 3.4 mmol/L — ABNORMAL LOW (ref 3.5–5.1)
Sodium: 142 mmol/L (ref 135–145)
TCO2: 28 mmol/L (ref 22–32)
pCO2 arterial: 43.5 mmHg (ref 32.0–48.0)
pH, Arterial: 7.394 (ref 7.350–7.450)
pO2, Arterial: 423 mmHg — ABNORMAL HIGH (ref 83.0–108.0)

## 2020-02-09 LAB — RAPID URINE DRUG SCREEN, HOSP PERFORMED
Amphetamines: NOT DETECTED
Barbiturates: NOT DETECTED
Benzodiazepines: NOT DETECTED
Cocaine: NOT DETECTED
Opiates: NOT DETECTED
Tetrahydrocannabinol: NOT DETECTED

## 2020-02-09 LAB — I-STAT CHEM 8, ED
BUN: 19 mg/dL (ref 8–23)
Calcium, Ion: 1.12 mmol/L — ABNORMAL LOW (ref 1.15–1.40)
Chloride: 106 mmol/L (ref 98–111)
Creatinine, Ser: 0.7 mg/dL (ref 0.44–1.00)
Glucose, Bld: 129 mg/dL — ABNORMAL HIGH (ref 70–99)
HCT: 33 % — ABNORMAL LOW (ref 36.0–46.0)
Hemoglobin: 11.2 g/dL — ABNORMAL LOW (ref 12.0–15.0)
Potassium: 3.4 mmol/L — ABNORMAL LOW (ref 3.5–5.1)
Sodium: 140 mmol/L (ref 135–145)
TCO2: 23 mmol/L (ref 22–32)

## 2020-02-09 LAB — RESPIRATORY PANEL BY RT PCR (FLU A&B, COVID)
Influenza A by PCR: NEGATIVE
Influenza B by PCR: NEGATIVE
SARS Coronavirus 2 by RT PCR: NEGATIVE

## 2020-02-09 LAB — SODIUM
Sodium: 141 mmol/L (ref 135–145)
Sodium: 142 mmol/L (ref 135–145)

## 2020-02-09 LAB — TRIGLYCERIDES: Triglycerides: 101 mg/dL (ref <150)

## 2020-02-09 LAB — HEMOGLOBIN A1C
Hgb A1c MFr Bld: 5.6 % (ref 4.8–5.6)
Mean Plasma Glucose: 114.02 mg/dL

## 2020-02-09 LAB — PROTIME-INR
INR: 0.9 (ref 0.8–1.2)
Prothrombin Time: 11.8 seconds (ref 11.4–15.2)

## 2020-02-09 LAB — HIV ANTIBODY (ROUTINE TESTING W REFLEX): HIV Screen 4th Generation wRfx: NONREACTIVE

## 2020-02-09 LAB — GLUCOSE, CAPILLARY
Glucose-Capillary: 155 mg/dL — ABNORMAL HIGH (ref 70–99)
Glucose-Capillary: 165 mg/dL — ABNORMAL HIGH (ref 70–99)

## 2020-02-09 LAB — ETHANOL: Alcohol, Ethyl (B): <10 mg/dL (ref <10)

## 2020-02-09 LAB — MRSA PCR SCREENING: MRSA by PCR: NEGATIVE

## 2020-02-09 LAB — APTT: aPTT: 28 seconds (ref 24–36)

## 2020-02-09 MED ORDER — ACETAMINOPHEN 160 MG/5ML PO SOLN
650.0000 mg | ORAL | Status: DC | PRN
  Administered 2020-02-10 – 2020-02-21 (×22): 650 mg
  Filled 2020-02-09 (×25): qty 20.3

## 2020-02-09 MED ORDER — SODIUM CHLORIDE 0.9 % IV SOLN
2000.0000 mg | INTRAVENOUS | Status: AC
  Administered 2020-02-09: 2000 mg via INTRAVENOUS
  Filled 2020-02-09: qty 20

## 2020-02-09 MED ORDER — SODIUM CHLORIDE 0.9 % IV SOLN: INTRAVENOUS | Status: DC

## 2020-02-09 MED ORDER — ONDANSETRON HCL 4 MG/2ML IJ SOLN
4.0000 mg | INTRAMUSCULAR | Status: AC
  Administered 2020-02-09: 4 mg via INTRAVENOUS

## 2020-02-09 MED ORDER — DOCUSATE SODIUM 50 MG/5ML PO LIQD
100.0000 mg | Freq: Two times a day (BID) | ORAL | Status: DC
  Administered 2020-02-10 – 2020-02-20 (×16): 100 mg
  Filled 2020-02-09 (×16): qty 10

## 2020-02-09 MED ORDER — LABETALOL HCL 5 MG/ML IV SOLN: 20.0000 mg | Freq: Once | INTRAVENOUS | Status: DC

## 2020-02-09 MED ORDER — ETOMIDATE 2 MG/ML IV SOLN
INTRAVENOUS | Status: DC | PRN
  Administered 2020-02-09: 20 mg via INTRAVENOUS

## 2020-02-09 MED ORDER — CHLORHEXIDINE GLUCONATE 0.12% ORAL RINSE (MEDLINE KIT)
15.0000 mL | Freq: Two times a day (BID) | OROMUCOSAL | Status: DC
  Administered 2020-02-10 – 2020-02-26 (×31): 15 mL via OROMUCOSAL

## 2020-02-09 MED ORDER — SODIUM CHLORIDE 23.4 % INJECTION (4 MEQ/ML) FOR IV ADMINISTRATION: 120.0000 meq | INTRAVENOUS | Status: DC

## 2020-02-09 MED ORDER — MIDAZOLAM HCL 2 MG/2ML IJ SOLN
1.0000 mg | INTRAMUSCULAR | Status: DC | PRN
  Administered 2020-02-14 – 2020-02-15 (×3): 1 mg via INTRAVENOUS
  Filled 2020-02-09 (×6): qty 2

## 2020-02-09 MED ORDER — ROCURONIUM BROMIDE 50 MG/5ML IV SOLN
INTRAVENOUS | Status: DC | PRN
  Administered 2020-02-09: 75 mg via INTRAVENOUS

## 2020-02-09 MED ORDER — INSULIN ASPART 100 UNIT/ML ~~LOC~~ SOLN
0.0000 [IU] | SUBCUTANEOUS | Status: DC
  Administered 2020-02-09 – 2020-02-10 (×2): 2 [IU] via SUBCUTANEOUS
  Administered 2020-02-10: 1 [IU] via SUBCUTANEOUS
  Administered 2020-02-10: 2 [IU] via SUBCUTANEOUS
  Administered 2020-02-11 (×2): 1 [IU] via SUBCUTANEOUS
  Administered 2020-02-11: 2 [IU] via SUBCUTANEOUS
  Administered 2020-02-12 – 2020-02-13 (×7): 1 [IU] via SUBCUTANEOUS
  Administered 2020-02-13: 2 [IU] via SUBCUTANEOUS
  Administered 2020-02-13 – 2020-02-15 (×7): 1 [IU] via SUBCUTANEOUS
  Administered 2020-02-15: 2 [IU] via SUBCUTANEOUS
  Administered 2020-02-15 – 2020-02-22 (×28): 1 [IU] via SUBCUTANEOUS

## 2020-02-09 MED ORDER — ORAL CARE MOUTH RINSE
15.0000 mL | OROMUCOSAL | Status: DC
  Administered 2020-02-09 – 2020-02-10 (×2): 15 mL via OROMUCOSAL

## 2020-02-09 MED ORDER — CHLORHEXIDINE GLUCONATE 0.12% ORAL RINSE (MEDLINE KIT)
15.0000 mL | Freq: Two times a day (BID) | OROMUCOSAL | Status: DC
  Administered 2020-02-09: 15 mL via OROMUCOSAL

## 2020-02-09 MED ORDER — SODIUM CHLORIDE 3 % IV SOLN
INTRAVENOUS | Status: DC
  Administered 2020-02-09 – 2020-02-10 (×5): 75 mL/h via INTRAVENOUS
  Filled 2020-02-09 (×8): qty 500

## 2020-02-09 MED ORDER — ORAL CARE MOUTH RINSE
15.0000 mL | OROMUCOSAL | Status: DC
  Administered 2020-02-10 – 2020-02-27 (×156): 15 mL via OROMUCOSAL

## 2020-02-09 MED ORDER — IOHEXOL 350 MG/ML SOLN
75.0000 mL | Freq: Once | INTRAVENOUS | Status: AC | PRN
  Administered 2020-02-09: 75 mL via INTRAVENOUS

## 2020-02-09 MED ORDER — POTASSIUM CHLORIDE 20 MEQ/15ML (10%) PO SOLN
20.0000 meq | Freq: Once | ORAL | Status: AC
  Administered 2020-02-10: 20 meq
  Filled 2020-02-09: qty 15

## 2020-02-09 MED ORDER — PANTOPRAZOLE SODIUM 40 MG IV SOLR: 40.0000 mg | Freq: Every day | INTRAVENOUS | Status: DC

## 2020-02-09 MED ORDER — FENTANYL CITRATE (PF) 100 MCG/2ML IJ SOLN: 25.0000 ug | INTRAMUSCULAR | Status: DC | PRN

## 2020-02-09 MED ORDER — SODIUM CHLORIDE 3 % IV SOLN: INTRAVENOUS | Status: DC

## 2020-02-09 MED ORDER — ACETAMINOPHEN 650 MG RE SUPP: 650.0000 mg | RECTAL | Status: DC | PRN

## 2020-02-09 MED ORDER — SODIUM CHLORIDE 3 % IV BOLUS
250.0000 mL | Freq: Once | INTRAVENOUS | Status: AC
  Administered 2020-02-09: 250 mL via INTRAVENOUS
  Filled 2020-02-09: qty 250

## 2020-02-09 MED ORDER — LORAZEPAM 2 MG/ML IJ SOLN
INTRAMUSCULAR | Status: AC
  Administered 2020-02-09: 22:00:00 2 mg
  Filled 2020-02-09: qty 1

## 2020-02-09 MED ORDER — LORAZEPAM 2 MG/ML IJ SOLN: 2.0000 mg | Freq: Once | INTRAMUSCULAR | Status: AC

## 2020-02-09 MED ORDER — FENTANYL CITRATE (PF) 100 MCG/2ML IJ SOLN
25.0000 ug | INTRAMUSCULAR | Status: DC | PRN
  Administered 2020-02-09: 50 ug via INTRAVENOUS
  Administered 2020-02-10 – 2020-02-11 (×3): 100 ug via INTRAVENOUS
  Administered 2020-02-15: 50 ug via INTRAVENOUS
  Administered 2020-02-17: 100 ug via INTRAVENOUS
  Filled 2020-02-09 (×5): qty 2

## 2020-02-09 MED ORDER — STROKE: EARLY STAGES OF RECOVERY BOOK
Freq: Once | Status: AC
  Filled 2020-02-09: qty 1

## 2020-02-09 MED ORDER — LABETALOL HCL 5 MG/ML IV SOLN
20.0000 mg | Freq: Once | INTRAVENOUS | Status: AC
  Administered 2020-02-09: 22:00:00 20 mg via INTRAVENOUS
  Filled 2020-02-09: qty 4

## 2020-02-09 MED ORDER — CLEVIDIPINE BUTYRATE 0.5 MG/ML IV EMUL
0.0000 mg/h | INTRAVENOUS | Status: DC
  Administered 2020-02-09: 8 mg/h via INTRAVENOUS
  Administered 2020-02-09: 1 mg/h via INTRAVENOUS
  Administered 2020-02-09: 21 mg/h via INTRAVENOUS
  Filled 2020-02-09 (×2): qty 50

## 2020-02-09 MED ORDER — CLEVIDIPINE BUTYRATE 0.5 MG/ML IV EMUL
0.0000 mg/h | INTRAVENOUS | Status: DC
  Administered 2020-02-09: 18 mg/h via INTRAVENOUS
  Administered 2020-02-10: 10 mg/h via INTRAVENOUS
  Administered 2020-02-10 (×3): 21 mg/h via INTRAVENOUS
  Administered 2020-02-10: 20 mg/h via INTRAVENOUS
  Administered 2020-02-11: 1 mg/h via INTRAVENOUS
  Administered 2020-02-11: 13 mg/h via INTRAVENOUS
  Filled 2020-02-09 (×6): qty 50
  Filled 2020-02-09: qty 100
  Filled 2020-02-09 (×2): qty 50

## 2020-02-09 MED ORDER — PROPOFOL 1000 MG/100ML IV EMUL
5.0000 ug/kg/min | INTRAVENOUS | Status: DC
  Administered 2020-02-09: 10 ug/kg/min via INTRAVENOUS
  Administered 2020-02-10 (×2): 20 ug/kg/min via INTRAVENOUS
  Administered 2020-02-10: 50 ug/kg/min via INTRAVENOUS
  Administered 2020-02-10: 35 ug/kg/min via INTRAVENOUS
  Administered 2020-02-11 (×2): 20 ug/kg/min via INTRAVENOUS
  Administered 2020-02-11 – 2020-02-12 (×2): 5 ug/kg/min via INTRAVENOUS
  Administered 2020-02-13: 30 ug/kg/min via INTRAVENOUS
  Administered 2020-02-13: 30.04 ug/kg/min via INTRAVENOUS
  Administered 2020-02-14: 10 ug/kg/min via INTRAVENOUS
  Filled 2020-02-09 (×12): qty 100

## 2020-02-09 MED ORDER — ACETAMINOPHEN 325 MG PO TABS: 650.0000 mg | ORAL_TABLET | ORAL | Status: DC | PRN

## 2020-02-09 MED ORDER — FAMOTIDINE IN NACL 20-0.9 MG/50ML-% IV SOLN: 20.0000 mg | INTRAVENOUS | Status: DC

## 2020-02-09 MED ORDER — LEVETIRACETAM IN NACL 500 MG/100ML IV SOLN
500.0000 mg | Freq: Two times a day (BID) | INTRAVENOUS | Status: DC
  Administered 2020-02-10: 500 mg via INTRAVENOUS
  Filled 2020-02-09: qty 100

## 2020-02-09 MED ORDER — SENNOSIDES-DOCUSATE SODIUM 8.6-50 MG PO TABS
1.0000 | ORAL_TABLET | Freq: Two times a day (BID) | ORAL | Status: DC
  Filled 2020-02-09: qty 1

## 2020-02-09 MED ORDER — SODIUM CHLORIDE 0.9 % IV SOLN
2.0000 g | Freq: Three times a day (TID) | INTRAVENOUS | Status: AC
  Administered 2020-02-09 – 2020-02-15 (×17): 2 g via INTRAVENOUS
  Filled 2020-02-09 (×19): qty 2

## 2020-02-09 NOTE — ED Provider Notes (Signed)
Procedure Name: Intubation Date/Time: 01/31/2020 4:46 PM Performed by: Gracy Bruins, MD Pre-anesthesia Checklist: Patient identified, Suction available, Emergency Drugs available, Patient being monitored and Timeout performed Oxygen Delivery Method: Non-rebreather mask Preoxygenation: Pre-oxygenation with 100% oxygen Induction Type: Rapid sequence Ventilation: Mask ventilation without difficulty Laryngoscope Size: Glidescope and 3 Grade View: Grade I Tube size: 7.0 mm Number of attempts: 1 Airway Equipment and Method: Patient positioned with wedge pillow,  Rigid stylet and Video-laryngoscopy Placement Confirmation: ETT inserted through vocal cords under direct vision,  Positive ETCO2,  CO2 detector and Breath sounds checked- equal and bilateral Secured at: 23 cm Tube secured with: ETT holder Dental Injury: Teeth and Oropharynx as per pre-operative assessment            Gracy Bruins, MD 02/03/2020 1648    Derwood Kaplan, MD 01/26/2020 2005

## 2020-02-09 NOTE — Consult Note (Signed)
NAME:  Meredith Cooper, MRN:  [id number], DOB:  07/15/1956, LOS: 0 ADMISSION DATE:  02/23/2020, CONSULTATION DATE:  02/14/2020 REFERRING MD:  Pearlean Brownie - neuro, CHIEF COMPLAINT:  Code stroke - L gaze deviation, R sided weakness   Brief History   64 yo F presented to [facility] ED 4/16 as code stroke with sudden onset R sided weakness, L gaze deviation. Rapid deterioration in ED; patient vomited multiple times in CT, then required intubation for airway protection. CT H with large acute parenchymal hemorrhage in L frontal lobe, 8.2 x 6.5 x 5.6 cm, 57mm midline shift.   History of present illness   64 yo F PMH HTN HLD estrogen replacement who presented to ED 4/16 as code stroke with R sided weakness and L gaze deviation. Patient was picking up box at greenhouse and fell over. EMS was promptly dispatched, at which time patient noted to have L gaze deviation and R sided flaccid tone. Presents as code stroke. In ED, initially blinking to threat, globally aphasic, and spontaneous LUE movement as well as withdrawing LLE to pain. Rapid deterioration after vomiting in CT scanner. Required intubation for airway protection. CT H with large acute ICH with cytotoxic edema and midline shift. CTA without aneurysm.   Given 3% bolus. NSGY consulted but does not feel patient is operative candidate at this time. PCCM consulted for vent management.   Past Medical History  HTN HLD Hiatal hernia GERD S/p hysterectomy   Significant Hospital Events   4/16 Code stroke, large ICH, Intubated.   Consults:  NSGY PCCM   Procedures:  4/16 ETT >   Significant Diagnostic Tests:  4/16 CT H> Large acute intraparenchymal hemorrhage centered in L frontal lobe 8.2 x 6.5 x 5.6 cm, 70mm midline shift.  4/16 CTA H> no aneurysm   Micro Data:  4/16 SARS Cov2> neg   Antimicrobials:  4/16 cefepime>> added for possible aspiration event   Interim history/subjective:  Decreasing FiO2 on Vent after reviewing ABG with RT Not on sedation, has  only received CNS altering medications during intubation   Objective   Weight 109.3 kg.       No intake or output data in the 24 hours ending 02/01/2020 1554 Filed Weights   01/26/2020 1500  Weight: 109.3 kg    Examination: General: Obese adult F, intubated, not sedated, unresponsive  HENT: NCAT pink mmm  Anicteric sclera. Trachea midline. ETT secure  Lungs: Symmetrical chest expansion. No accessory muscle use  Cardiovascular: RRR s1s2 no rgm. Cap refill < 3 seconds 2+ radial pulses.  Abdomen: Obese soft round, non-distended. + bowels sounds  Extremities: Symmetrical bulk and tone. No obvious joint deformity. No cyanosis or clubbing  Neuro: Does not follow command. No response to sternal rub. Does not blink to threat. Initiating spontaneous breaths on ventilator  GU: WNL   Resolved Hospital Problem list     Assessment & Plan:   Acute respiratory failure with absent airway protection, in setting of intraparenchymal hemorrhage  -intubated in ED with failed airway protection after vomiting P -Continue MV. D/w RT to wean FiO2 per ABG  -AM CXR  -Pulm hygiene  -Will add cefepime for aspiration coverage given vomiting prior to ETT. Favor short abx course and low threshold to dc.  -Will add PRN sedation, RASS goal 0. No indication for continuous.   Large ICH with cytotoxic cerebral edema  -not surgical candidate at this time P -Neuro managing -SBP goal <140 x 24 hrs then < 160, cleviprex available  -3%  gtt for Na goal 150-155 -ECHO -Repeat imagine -fq neuro checks  -SCD only  -PCCM will plan for CVC and possible arterial line  HTN P -ICU monitoring -cleviprex if needed as above    Hypokalemia, mild P - Will order enteral replacement.  - Trend BMP   Leukocytosis, mild -likely reactive P -trend fever curve, CBC.  -empiric abx coverage for aspiration PNA started as above   At risk malnutrition P -Advance OGT per CXR read -Initiate EN per RDN  Best practice:   Diet: EN per RDN  Pain/Anxiety/Delirium protocol (if indicated): PRN fentanyl PRN versed  VAP protocol (if indicated): yes DVT prophylaxis: SCD only  GI prophylaxis: protonix  Glucose control: monitor Mobility: BR Code Status: Full  Family Communication: Per Primary Disposition: ICU   Labs   CBC: Recent Labs  Lab 01/26/2020 1522 02/15/2020 1523  WBC  --  11.6*  HGB 11.2* 10.8*  HCT 33.0* 35.5*  MCV  --  84.7  PLT  --  307    Basic Metabolic Panel: Recent Labs  Lab 02/02/2020 1522  NA 140  K 3.4*  CL 106  GLUCOSE 129*  BUN 19  CREATININE 0.70   GFR: Estimated Creatinine Clearance: 85.1 mL/min (by C-G formula based on SCr of 0.7 mg/dL). Recent Labs  Lab 02/18/2020 1523  WBC 11.6*    Liver Function Tests: No results for input(s): AST, ALT, ALKPHOS, BILITOT, PROT, ALBUMIN in the last 168 hours. No results for input(s): LIPASE, AMYLASE in the last 168 hours. No results for input(s): AMMONIA in the last 168 hours.  ABG    Component Value Date/Time   TCO2 23 02/11/2020 1522     Coagulation Profile: No results for input(s): INR, PROTIME in the last 168 hours.  Cardiac Enzymes: No results for input(s): CKTOTAL, CKMB, CKMBINDEX, TROPONINI in the last 168 hours.  HbA1C: No results found for: HGBA1C  CBG: Recent Labs  Lab 01/25/2020 1515  GLUCAP 127*    Review of Systems:   Unable to obtain, patient intubated   Past Medical History  She,  has a past medical history of AR (allergic rhinitis), GERD (gastroesophageal reflux disease), Hiatal hernia, Hyperlipemia, Hypertension, Thyroid disease, and Vaginal prolapse.   Surgical History    Past Surgical History:  Procedure Laterality Date  . ABDOMINAL HYSTERECTOMY  2000   [place] [facility]  . BREAST BIOPSY    . VEIN LIGATION AND STRIPPING       Social History   reports that she has never smoked. She has never used smokeless tobacco. She reports that she does not drink alcohol or use drugs.   Family History    Her family history includes Bladder Cancer in her sister; Bone cancer in her mother; Colon cancer in her sister; Diabetes in her brother; Ovarian cancer in her maternal grandmother. There is no history of Heart disease or Breast cancer.   Allergies Allergies  Allergen Reactions  . Penicillins Itching and Rash     Home Medications  Prior to Admission medications   Medication Sig Start Date End Date Taking? Authorizing Provider  azelastine (ASTELIN) 0.1 % nasal spray Place into both nostrils 2 (two) times daily. Use in each nostril as directed    [provider]  Bacillus Coagulans-Inulin (PROBIOTIC) 1-250 BILLION-MG CAPS Take by mouth.    [provider]  Cetirizine HCl 10 MG CAPS Take by mouth.    [provider]  conjugated estrogens (PREMARIN) vaginal cream Insert 1 g intravaginal weekly Patient  not taking: Reported on 12/20/2019 12/17/18   Rubie Maid, MD  estradiol (ESTRACE) 1 MG tablet Take 1 tablet (1 mg total) by mouth daily. 12/01/19   Rubie Maid, MD  fluticasone (FLONASE) 50 MCG/ACT nasal spray Place 2 sprays into both nostrils daily.     [provider]  levocetirizine (XYZAL) 5 MG tablet Take 5 mg by mouth every evening.    [provider]  lisinopril (PRINIVIL,ZESTRIL) 10 MG tablet  12/31/17   [provider]  montelukast (SINGULAIR) 10 MG tablet Take 10 mg by mouth at bedtime.    [provider]  Multiple Vitamins-Minerals (EQ VISION FORMULA 50+ PO) Take by mouth.    [provider]  omega-3 fish oil (MAXEPA) 1000 MG CAPS capsule Take by mouth.    [provider]  omeprazole (PRILOSEC) 20 MG capsule Take 20 mg by mouth daily.    [provider]  OXYQUINOLONE SULFATE VAGINAL (TRIMO-SAN) 0.025 % GEL Place 1 g vaginally once a week. Patient not taking: Reported on 12/20/2019 05/19/18   Defrancesco, Alanda Slim, MD  pravastatin (PRAVACHOL) 20 MG tablet Take 20 mg by mouth daily.    [provider]  Resveratrol 100 MG CAPS Take by mouth.    [provider]  TRIMO-SAN 0.025-0.01 % GEL  05/20/18   [provider]     Critical care time: 40 minutes      CRITICAL CARE Performed by: Cristal Generous   Total critical care time: 40 minutes  Critical care time was exclusive of separately billable procedures and treating other patients. Critical care was necessary to treat or prevent imminent or life-threatening deterioration.  Critical care was time spent personally by me on the following activities: development of treatment plan with patient and/or surrogate as well as nursing, discussions with consultants, evaluation of patient's response to treatment, examination of patient, obtaining history from patient or surrogate, ordering and performing treatments and interventions, ordering and review of laboratory studies, ordering and review of radiographic studies, pulse oximetry and re-evaluation of patient's condition.  Eliseo Gum MSN, AGACNP-BC [place] [phone] If no answer, [phone] 03-05-2020, 5:22 PM

## 2020-02-09 NOTE — Progress Notes (Signed)
Pt cleared by neurosurgery to come up to ICU. No neurosurgical intervention needed at this time per neurosurgery. Bedside handoff given to [facility] on 4 No ICU.

## 2020-02-09 NOTE — Progress Notes (Signed)
Pt transported from ED Trauma C to 4N31 on ventilator. Pt stable throughout with no complications. VS within normal limits.

## 2020-02-09 NOTE — Evaluation (Signed)
PT Cancellation Note  Patient Details Name: Meredith Cooper MRN: [id number] DOB: 12/04/1955   Cancelled Treatment:    Reason Eval/Treat Not Completed: Active bedrest order Pt with current bedrest orders. Will follow up as pt mobility orders updated and as schedule allows.   Cindee Salt, DPT  Acute Rehabilitation Services  Pager: [phone] Office: [phone]    Lehman Prom 02/23/2020, 3:33 PM

## 2020-02-09 NOTE — Consult Note (Signed)
Chief Complaint   Chief Complaint  Patient presents with  . Code Stroke    HPI   Consult requested by: Dr Pearlean Brownie, Neuro Reason for consult: Left intraparenchymal hemorrhage  HPI: Meredith Cooper is a 64 y.o. female with history of HTN, HLD, on estrogen who presented to ED as code stroke. Reportedly in green house lifting a box and fell forward. Upon EMS arrival, patient was awake with left gaze and right sided flaccidy. In discussion with nursing, patient arrived moving left side involuntary but in CT scanner decompensated requiring intubation. Given etomindate and roc approx 45 minutes ago for intubation. No other sedation given/running. Clevipex started for BP control. 3% saline given per Neuro. No family at bedside.  Patient Active Problem List   Diagnosis Date Noted  . Stroke (HCC) Mar 07, 2020  . Obesity (BMI 30-39.9) 12/18/2018  . Post-menopause on HRT (hormone replacement therapy) 12/18/2018  . Essential hypertension 12/18/2018  . Hyperlipidemia 12/18/2018  . Incomplete bladder emptying 12/18/2018  . 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 Rectocele 12/10/2017  . Cystocele, midline 12/10/2017  . Status post hysterectomy 12/09/2016  . Pessary maintenance 04/22/2016  . SUI (stress urinary incontinence, female) 04/22/2016    PMH: Past Medical History:  Diagnosis Date  . AR (allergic rhinitis)   . GERD (gastroesophageal reflux disease)   . Hiatal hernia   . Hyperlipemia   . Hypertension   . Thyroid disease   . Vaginal prolapse     PSH: Past Surgical History:  Procedure Laterality Date  . ABDOMINAL HYSTERECTOMY  2000   [place] [facility]- bvd  . BREAST BIOPSY    . VEIN LIGATION AND STRIPPING      (Not in a hospital admission)   SH: Social History   Tobacco Use  . Smoking status: Never Smoker  . Smokeless tobacco: Never Used  Substance Use Topics  . Alcohol use: No  . Drug use: No    MEDS: Prior to Admission medications   Medication Sig Start Date End Date Taking? Authorizing  Provider  azelastine (ASTELIN) 0.1 % nasal spray Place into both nostrils 2 (two) times daily. Use in each nostril as directed    [provider]  Bacillus Coagulans-Inulin (PROBIOTIC) 1-250 BILLION-MG CAPS Take by mouth.    [provider]  Cetirizine HCl 10 MG CAPS Take by mouth.    [provider]  conjugated estrogens (PREMARIN) vaginal cream Insert 1 g intravaginal weekly Patient not taking: Reported on 12/20/2019 12/17/18   Hildred Laser, MD  estradiol (ESTRACE) 1 MG tablet Take 1 tablet (1 mg total) by mouth daily. 12/01/19   Hildred Laser, MD  fluticasone (FLONASE) 50 MCG/ACT nasal spray Place 2 sprays into both nostrils daily.     [provider]  levocetirizine (XYZAL) 5 MG tablet Take 5 mg by mouth every evening.    [provider]  lisinopril (PRINIVIL,ZESTRIL) 10 MG tablet  12/31/17   [provider]  montelukast (SINGULAIR) 10 MG tablet Take 10 mg by mouth at bedtime.    [provider]  Multiple Vitamins-Minerals (EQ VISION FORMULA 50+ PO) Take by mouth.    [provider]  omega-3 fish oil (MAXEPA) 1000 MG CAPS capsule Take by mouth.    [provider]  omeprazole (PRILOSEC) 20 MG capsule Take 20 mg by mouth daily.    [provider]  OXYQUINOLONE SULFATE VAGINAL (TRIMO-SAN) 0.025 % GEL Place 1 g vaginally once a week. Patient not taking: Reported on 12/20/2019 05/19/18   Defrancesco, Prentice Docker,  MD  pravastatin (PRAVACHOL) 20 MG tablet Take 20 mg by mouth daily.    [provider]  Resveratrol 100 MG CAPS Take by mouth.    [provider]  TRIMO-SAN 0.025-0.01 % GEL  05/20/18   [provider]    ALLERGY: Allergies  Allergen Reactions  . Penicillins Itching and Rash    Social History   Tobacco Use  . Smoking status: Never Smoker  . Smokeless tobacco: Never Used  Substance Use Topics  . Alcohol use: No     Family History  Problem Relation Age of Onset  . Bone  cancer Mother   . Bladder Cancer Sister   . Colon cancer Sister   . Diabetes Brother   . Ovarian cancer Maternal Grandmother   . Heart disease Neg Hx   . Breast cancer Neg Hx      ROS   ROS intubated, unable to obtain  Exam   Vitals:   2020-03-05 1600  BP: (!) 142/44  Pulse: 64  Resp: 20  SpO2: 100%   Patient examined twice in a 20 minute time period. Initial exam immediately upon arrival.   Intubated WDWN No scleral injection Regular rate Does not awaken to voice Pupils 4mm reactive bilaterally, right more sluggish than left Gag present, no corneal Minimal flexion with LUE to central pain. Minimal toe movement LLE.  No RUE/RLE response.  Results - Imaging/Labs   Results for orders placed or performed during the hospital encounter of Mar 05, 2020 (from the past 48 hour(s))  CBG monitoring, ED     Status: Abnormal   Collection Time: Mar 05, 2020  3:15 PM  Result Value Ref Range   Glucose-Capillary 127 (H) 70 - 99 mg/dL    Comment: Glucose reference range applies only to samples taken after fasting for at least 8 hours.  Ethanol     Status: None   Collection Time: 03-05-2020  3:16 PM  Result Value Ref Range   Alcohol, Ethyl (B) <10 <10 mg/dL    Comment: (NOTE) Lowest detectable limit for serum alcohol is 10 mg/dL. For medical purposes only. Performed at [facility], [place] [address], [place], [place] [address]   I-stat chem 8, ED     Status: Abnormal   Collection Time: 03-05-20  3:22 PM  Result Value Ref Range   Sodium 140 135 - 145 mmol/L   Potassium 3.4 (L) 3.5 - 5.1 mmol/L   Chloride 106 98 - 111 mmol/L   BUN 19 8 - 23 mg/dL   Creatinine, Ser 0.70 0.44 - 1.00 mg/dL   Glucose, Bld 129 (H) 70 - 99 mg/dL    Comment: Glucose reference range applies only to samples taken after fasting for at least 8 hours.   Calcium, Ion 1.12 (L) 1.15 - 1.40 mmol/L   TCO2 23 22 - 32 mmol/L   Hemoglobin 11.2 (L) 12.0 - 15.0 g/dL   HCT 33.0 (L) 36.0 - 46.0 %  Protime-INR      Status: None   Collection Time: 03/05/20  3:23 PM  Result Value Ref Range   Prothrombin Time 11.8 11.4 - 15.2 seconds   INR 0.9 0.8 - 1.2    Comment: (NOTE) INR goal varies based on device and disease states. Performed at [facility], [place] [address], [place], [place] [address]   APTT     Status: None   Collection Time: Mar 05, 2020  3:23 PM  Result Value Ref Range   aPTT 28 24 - 36 seconds  Comment: Performed at [facility], [address] [place], [place] [address]  CBC     Status: Abnormal   Collection Time: 02/14/2020  3:23 PM  Result Value Ref Range   WBC 11.6 (H) 4.0 - 10.5 K/uL   RBC 4.19 3.87 - 5.11 MIL/uL   Hemoglobin 10.8 (L) 12.0 - 15.0 g/dL   HCT 60.435.5 (L) 54.036.0 - 98.146.0 %   MCV 84.7 80.0 - 100.0 fL   MCH 25.8 (L) 26.0 - 34.0 pg   MCHC 30.4 30.0 - 36.0 g/dL   RDW 19.113.8 47.811.5 - 29.515.5 %   Platelets 307 150 - 400 K/uL   nRBC 0.0 0.0 - 0.2 %    Comment: Performed at [facility], [address] [place], [place] [address]  Comprehensive metabolic panel     Status: Abnormal   Collection Time: 02/15/2020  3:23 PM  Result Value Ref Range   Sodium 139 135 - 145 mmol/L   Potassium 3.4 (L) 3.5 - 5.1 mmol/L   Chloride 106 98 - 111 mmol/L   CO2 22 22 - 32 mmol/L   Glucose, Bld 132 (H) 70 - 99 mg/dL    Comment: Glucose reference range applies only to samples taken after fasting for at least 8 hours.   BUN 17 8 - 23 mg/dL   Creatinine, Ser 8.650.75 0.44 - 1.00 mg/dL   Calcium 9.0 8.9 - 78.410.3 mg/dL   Total Protein 6.7 6.5 - 8.1 g/dL   Albumin 3.4 (L) 3.5 - 5.0 g/dL   AST 25 15 - 41 U/L   ALT 19 0 - 44 U/L   Alkaline Phosphatase 69 38 - 126 U/L   Total Bilirubin 0.7 0.3 - 1.2 mg/dL   GFR calc non Af Amer >60 >60 mL/min   GFR calc Af Amer >60 >60 mL/min   Anion gap 11 5 - 15    Comment: Performed at [facility], [address] [place], [place] [address]    CT Code Stroke CTA Head W/WO contrast  Result Date: 02/01/2020 CLINICAL DATA:  Neuro deficit, acute,  stroke suspected. Additional history provided: Unresponsive, left gaze, ri right-sided flaccid. EXAM: CT ANGIOGRAPHY HEAD AND NECK TECHNIQUE: Multidetector CT imaging of the head and neck was performed using the standard protocol during bolus administration of intravenous contrast. Multiplanar CT image reconstructions and MIPs were obtained to evaluate the vascular anatomy. Carotid stenosis measurements (when applicable) are obtained utilizing NASCET criteria, using the distal internal carotid diameter as the denominator. CONTRAST:  75mL OMNIPAQUE IOHEXOL 350 MG/ML SOLN COMPARISON:  Concurrently performed non-contrast head CT. FINDINGS: CTA NECK FINDINGS Aortic arch: Standard aortic branching. The visualized aortic arch is unremarkable. No significant innominate or proximal subclavian artery stenosis. Right carotid system: CCA and ICA patent within the neck without stenosis. Left carotid system: CCA and ICA patent within the neck without stenosis. Vertebral arteries: The vertebral arteries are codominant and patent within the neck without significant stenosis. Skeleton: No acute bony abnormality or aggressive osseous lesion. Cervical spondylosis without high-grade bony spinal canal narrowing. Other neck: No neck mass or cervical lymphadenopathy. Upper chest: No consolidation within the imaged lung apices. Review of the MIP images confirms the above findings CTA HEAD FINDINGS Anterior circulation: The intracranial internal carotid arteries are patent without significant stenosis. The M1 middle cerebral arteries are patent without significant stenosis. No M2 proximal branch occlusion or high-grade proximal stenosis is identified. The anterior cerebral arteries are patent without significant proximal stenosis. No  intracranial aneurysm is identified. There is no evidence of vascular malformation on the current exam. No contrast blush is seen on arterial phase imaging to suggest active arterial extravasation at site of  the acute left frontal lobe parenchymal hematoma. Posterior circulation: The intracranial vertebral arteries are patent without significant stenosis, as is the basilar artery. The bilateral posterior cerebral arteries are patent without significant proximal stenosis. Posterior communicating arteries are poorly delineated and may be hypoplastic or absent bilaterally. Venous sinuses: Within limitations of contrast timing, no convincing thrombus. Anatomic variants: As described. Review of the MIP images confirms the above findings These results were communicated to Dr. Pearlean Brownie At 3:58 pmon 04/15/2021by text page via the [facility] messaging system. IMPRESSION: CTA neck: The bilateral common carotid, internal carotid and vertebral arteries are patent within the neck without significant stenosis. CTA head: 1. No intracranial large vessel occlusion or proximal high-grade arterial stenosis. 2. No evidence of vascular malformation on the current exam. However, consider imaging follow-up once the hematoma involutes for further assessment. 3. No contrast blush is demonstrated in the region of the acute left frontal lobe hematoma on arterial phase imaging to suggest active extravasation. 4. No intracranial aneurysm is identified. Electronically Signed   By: Jackey Loge DO   On: 02/05/2020 15:59   CT Code Stroke CTA Neck W/WO contrast  Result Date: 02/06/2020 CLINICAL DATA:  Neuro deficit, acute, stroke suspected. Additional history provided: Unresponsive, left gaze, ri right-sided flaccid. EXAM: CT ANGIOGRAPHY HEAD AND NECK TECHNIQUE: Multidetector CT imaging of the head and neck was performed using the standard protocol during bolus administration of intravenous contrast. Multiplanar CT image reconstructions and MIPs were obtained to evaluate the vascular anatomy. Carotid stenosis measurements (when applicable) are obtained utilizing NASCET criteria, using the distal internal carotid diameter as the denominator. CONTRAST:  10mL  OMNIPAQUE IOHEXOL 350 MG/ML SOLN COMPARISON:  Concurrently performed non-contrast head CT. FINDINGS: CTA NECK FINDINGS Aortic arch: Standard aortic branching. The visualized aortic arch is unremarkable. No significant innominate or proximal subclavian artery stenosis. Right carotid system: CCA and ICA patent within the neck without stenosis. Left carotid system: CCA and ICA patent within the neck without stenosis. Vertebral arteries: The vertebral arteries are codominant and patent within the neck without significant stenosis. Skeleton: No acute bony abnormality or aggressive osseous lesion. Cervical spondylosis without high-grade bony spinal canal narrowing. Other neck: No neck mass or cervical lymphadenopathy. Upper chest: No consolidation within the imaged lung apices. Review of the MIP images confirms the above findings CTA HEAD FINDINGS Anterior circulation: The intracranial internal carotid arteries are patent without significant stenosis. The M1 middle cerebral arteries are patent without significant stenosis. No M2 proximal branch occlusion or high-grade proximal stenosis is identified. The anterior cerebral arteries are patent without significant proximal stenosis. No intracranial aneurysm is identified. There is no evidence of vascular malformation on the current exam. No contrast blush is seen on arterial phase imaging to suggest active arterial extravasation at site of the acute left frontal lobe parenchymal hematoma. Posterior circulation: The intracranial vertebral arteries are patent without significant stenosis, as is the basilar artery. The bilateral posterior cerebral arteries are patent without significant proximal stenosis. Posterior communicating arteries are poorly delineated and may be hypoplastic or absent bilaterally. Venous sinuses: Within limitations of contrast timing, no convincing thrombus. Anatomic variants: As described. Review of the MIP images confirms the above findings These  results were communicated to Dr. Pearlean Brownie At 3:58 pmon 4/16/2021by text page via the [facility] messaging system. IMPRESSION: CTA neck:  The bilateral common carotid, internal carotid and vertebral arteries are patent within the neck without significant stenosis. CTA head: 1. No intracranial large vessel occlusion or proximal high-grade arterial stenosis. 2. No evidence of vascular malformation on the current exam. However, consider imaging follow-up once the hematoma involutes for further assessment. 3. No contrast blush is demonstrated in the region of the acute left frontal lobe hematoma on arterial phase imaging to suggest active extravasation. 4. No intracranial aneurysm is identified. Electronically Signed   By: Jackey Loge DO   On: 2020/02/18 15:59   CT HEAD CODE STROKE WO CONTRAST  Result Date: 18-Feb-2020 CLINICAL DATA:  Code stroke. Neuro deficit, acute, stroke suspected. Additional history provided: Unresponsive, left gaze, right-sided flaccid. EXAM: CT HEAD WITHOUT CONTRAST TECHNIQUE: Contiguous axial images were obtained from the base of the skull through the vertex without intravenous contrast. COMPARISON:  No pertinent prior studies available for comparison. FINDINGS: Brain: There is a very large acute parenchymal hemorrhage centered within the left frontal lobe which measures 8.2 x 6.5 x 5.6 cm. There is surrounding parenchymal edema. There is mild intraventricular extension into the left foramen of Monro, third ventricle and cerebral aqueduct. Associated mass effect with significant partial effacement of the left lateral ventricle. 5 mm rightward midline shift measured at the level of the septum pellucidum. No hydrocephalus or evidence of ventricular entrapment on the current exam. Elsewhere the brain appears unremarkable. There is no extra-axial fluid collection. Vascular: No hyperdense vessel. Skull: Normal. Negative for fracture or focal lesion. Sinuses/Orbits: Visualized orbits demonstrate no acute  abnormality. Mild ethmoid sinus mucosal thickening. No significant mastoid effusion. IMPRESSION: Very large acute parenchymal hemorrhage centered within the left frontal lobe measuring 8.2 x 6.5 x 5.6 cm. Surrounding parenchymal edema. Mild intraventricular extension of hemorrhage into the left foramen of Monro, third ventricle and cerebral aqueduct. Associated mass effect with partial effacement of the left lateral ventricle. 5 mm rightward midline shift at the level of the septum pellucidum. Electronically Signed   By: Jackey Loge DO   On: 18-Feb-2020 15:40   Impression/Plan   64 y.o. female presented as code stroke, found to have a large left frontal intraparenchymal hemorrhage. Imaging reviewed with Dr Conchita Paris. There is minimal ventricular effacement, no hydrocephalus, minimal MLS, no brainstem compression/tentorial herniation. There is no role for NS intervention at present. Rec aggressive medical care per Neurology team. We will follow.  Please call for any concerns.  Cindra Presume, PA-C [facility]

## 2020-02-09 NOTE — Procedures (Signed)
Arterial Catheter Insertion Procedure Note Meredith Cooper [id number] [birth year]  Procedure: Insertion of Arterial Catheter  Indications: Blood pressure monitoring  Procedure Details Consent: Risks of procedure as well as the alternatives and risks of each were explained to the (patient/caregiver).  Consent for procedure obtained. Time Out: Verified patient identification, verified procedure, site/side was marked, verified correct patient position, special equipment/implants available, medications/allergies/relevent history reviewed, required imaging and test results available.  Performed  Maximum sterile technique was used including antiseptics, cap, gloves, gown, hand hygiene and mask. Skin prep: Chlorhexidine; local anesthetic administered 20 gauge catheter was inserted into right radial artery using the Seldinger technique. ULTRASOUND GUIDANCE USED: YES Evaluation Blood flow good; BP tracing good. Complications: No apparent complications.  Dr. Verdene Lennert Internal Medicine PGY-1  Pager: [phone] 02/12/2020, 6:49 PM

## 2020-02-09 NOTE — ED Triage Notes (Signed)
Pt to ED via [facility].   Pt was carrying a box at a green house when she dropped the box and collapsed to the ground.  On EMS arrival pt was unresponsive with left side gaze.

## 2020-02-09 NOTE — H&P (Addendum)
NEURO HOSPITALIST  H&P      Chief Complaint: left gaze right side flaccid  History obtained from:  Chart Review/ EMS HPI:                                                                                                                                         Meredith Cooper is an 64 y.o. female  With PMH HTN, HLD on estrogen who presented to ED as a code stroke for left gaze and right side flaccid.   Patient was at a green house. She went to pick up a box and fell over to the left. EMS was called. Patient noted to have left gaze and right side flaccid. Patient vomited 2 times in CT. Intubated for airway protection. 3% saline bolus and drip initiated. Bp mildly elevated. Started on cleviplex drip  ED course:  CTH: Very large acute parenchymal hemorrhage centered within the left frontal lobe measuring 8.2 x 6.5 x 5.6 cm; 5 mm midline shift CTA:no anuerysm BP: 148/70 BG: 142  Date last known well: 02/21/2020 Time last known well: 1415 tPA Given: No: ICH Modified Rankin: Rankin Score=0    Intracerebral Hemorrhage (ICH) Score Total:  2  Past Medical History:  Diagnosis Date  . AR (allergic rhinitis)   . GERD (gastroesophageal reflux disease)   . Hiatal hernia   . Hyperlipemia   . Hypertension   . Thyroid disease   . Vaginal prolapse     Past Surgical History:  Procedure Laterality Date  . ABDOMINAL HYSTERECTOMY  2000   [place] [facility]- [facility]  . BREAST BIOPSY    . VEIN LIGATION AND STRIPPING      Family History  Problem Relation Age of Onset  . Bone cancer Mother   . Bladder Cancer Sister   . Colon cancer Sister   . Diabetes Brother   . Ovarian cancer Maternal Grandmother   . Heart disease Neg Hx   . Breast cancer Neg Hx          Social History:  reports that she has never smoked. She has never used smokeless tobacco. She reports that she does not drink alcohol or use drugs.  Allergies:  Allergies  Allergen Reactions  . Penicillins  Itching and Rash    Medications:  Current Facility-Administered Medications  Medication Dose Route Frequency Provider Last Rate Last Admin  .  stroke: mapping our early stages of recovery book   Does not apply Once Arline Asp, NP      . acetaminophen (TYLENOL) tablet 650 mg  650 mg Oral Q4H PRN Arline Asp, NP       Or  . acetaminophen (TYLENOL) 160 MG/5ML solution 650 mg  650 mg Per Tube Q4H PRN Arline Asp, NP       Or  . acetaminophen (TYLENOL) suppository 650 mg  650 mg Rectal Q4H PRN Arline Asp, NP      . labetalol (NORMODYNE) injection 20 mg  20 mg Intravenous Once Arline Asp, NP       And  . clevidipine (CLEVIPREX) infusion 0.5 mg/mL  0-21 mg/hr Intravenous Continuous Arline Asp, NP      . ondansetron (ZOFRAN) injection 4 mg  4 mg Intravenous STAT Arline Asp, NP      . pantoprazole (PROTONIX) injection 40 mg  40 mg Intravenous QHS Arline Asp, NP      . senna-docusate (Senokot-S) tablet 1 tablet  1 tablet Oral BID Arline Asp, NP      . sodium chloride (hypertonic) 3 % solution   Intravenous Continuous Valentina Lucks N, NP      . sodium chloride 23.4 % (4 mEq/mL) IV in VIAFLEX BAG  120 mEq Intravenous STAT Arline Asp, NP       Current Outpatient Medications  Medication Sig Dispense Refill  . azelastine (ASTELIN) 0.1 % nasal spray Place into both nostrils 2 (two) times daily. Use in each nostril as directed    . Bacillus Coagulans-Inulin (PROBIOTIC) 1-250 BILLION-MG CAPS Take by mouth.    . Cetirizine HCl 10 MG CAPS Take by mouth.    . conjugated estrogens (PREMARIN) vaginal cream Insert 1 g intravaginal weekly (Patient not taking: Reported on 12/20/2019) 30 g 3  . estradiol (ESTRACE) 1 MG tablet Take 1 tablet (1 mg total) by mouth daily. 30 tablet 6  . fluticasone (FLONASE)  50 MCG/ACT nasal spray Place 2 sprays into both nostrils daily.     Marland Kitchen levocetirizine (XYZAL) 5 MG tablet Take 5 mg by mouth every evening.    Marland Kitchen lisinopril (PRINIVIL,ZESTRIL) 10 MG tablet     . montelukast (SINGULAIR) 10 MG tablet Take 10 mg by mouth at bedtime.    . Multiple Vitamins-Minerals (EQ VISION FORMULA 50+ PO) Take by mouth.    . omega-3 fish oil (MAXEPA) 1000 MG CAPS capsule Take by mouth.    Marland Kitchen omeprazole (PRILOSEC) 20 MG capsule Take 20 mg by mouth daily.    Dani Gobble SULFATE VAGINAL (TRIMO-SAN) 0.025 % GEL Place 1 g vaginally once a week. (Patient not taking: Reported on 12/20/2019) 113.4 g 2  . pravastatin (PRAVACHOL) 20 MG tablet Take 20 mg by mouth daily.    Marland Kitchen Resveratrol 100 MG CAPS Take by mouth.    . TRIMO-SAN 0.025-0.01 % GEL        ROS:  History obtained from unobtainable from patient due to mental status     General Examination:                                                                                                      Weight 109.3 kg.  Physical Exam  Constitutional: obese middle aged Caucasian lady Appears well-developed and well-nourished.  Eyes: Normal external eye and conjunctiva. HENT: Normocephalic, no lesions, without obvious abnormality.   Musculoskeletal-no joint tenderness, deformity or swelling Cardiovascular: Normal rate and regular rhythm.  Respiratory: Effort normal, non-labored breathing saturations WNL GI: Soft.  No distension. There is no tenderness.  Skin: WDI  Neurological Examination  Patient is lethargic.  Eyes open but she is globally aphasic.  She has left gaze deviation.  She does not follow even simple commands.  She blinks to threat on the left but not on the right.  Pupils equal 4 mm reactive.  Right lower facial weakness.  Tongue midline.  She has some spontaneous left upper extremity  movements.  She withdraws left lower extremity well to painful stimuli.  She has dense flaccid right hemiplegia with no response to pain.  Left plantar upgoing right downgoing.  Gait not tested.     Lab Results: Basic Metabolic Panel: Recent Labs  Lab 02/11/2020 1522  NA 140  K 3.4*  CL 106  GLUCOSE 129*  BUN 19  CREATININE 0.70    CBC: Recent Labs  Lab 02/14/2020 1522  HGB 11.2*  HCT 33.0*     CBG: Recent Labs  Lab 01/28/2020 1515  GLUCAP 127*    Imaging: CT Code Stroke CTA Head W/WO contrast  Result Date: 02/21/2020 CLINICAL DATA:  Neuro deficit, acute, stroke suspected. Additional history provided: Unresponsive, left gaze, ri right-sided flaccid. EXAM: CT ANGIOGRAPHY HEAD AND NECK TECHNIQUE: Multidetector CT imaging of the head and neck was performed using the standard protocol during bolus administration of intravenous contrast. Multiplanar CT image reconstructions and MIPs were obtained to evaluate the vascular anatomy. Carotid stenosis measurements (when applicable) are obtained utilizing NASCET criteria, using the distal internal carotid diameter as the denominator. CONTRAST:  64mL OMNIPAQUE IOHEXOL 350 MG/ML SOLN COMPARISON:  Concurrently performed non-contrast head CT. FINDINGS: CTA NECK FINDINGS Aortic arch: Standard aortic branching. The visualized aortic arch is unremarkable. No significant innominate or proximal subclavian artery stenosis. Right carotid system: CCA and ICA patent within the neck without stenosis. Left carotid system: CCA and ICA patent within the neck without stenosis. Vertebral arteries: The vertebral arteries are codominant and patent within the neck without significant stenosis. Skeleton: No acute bony abnormality or aggressive osseous lesion. Cervical spondylosis without high-grade bony spinal canal narrowing. Other neck: No neck mass or cervical lymphadenopathy. Upper chest: No consolidation within the imaged lung apices. Review of the MIP images  confirms the above findings CTA HEAD FINDINGS Anterior circulation: The intracranial internal carotid arteries are patent without significant stenosis. The M1 middle cerebral arteries are patent without significant stenosis. No M2 proximal branch occlusion or high-grade proximal stenosis is identified. The anterior cerebral arteries are patent without significant proximal  stenosis. No intracranial aneurysm is identified. There is no evidence of vascular malformation on the current exam. No contrast blush is seen on arterial phase imaging to suggest active arterial extravasation at site of the acute left frontal lobe parenchymal hematoma. Posterior circulation: The intracranial vertebral arteries are patent without significant stenosis, as is the basilar artery. The bilateral posterior cerebral arteries are patent without significant proximal stenosis. Posterior communicating arteries are poorly delineated and may be hypoplastic or absent bilaterally. Venous sinuses: Within limitations of contrast timing, no convincing thrombus. Anatomic variants: As described. Review of the MIP images confirms the above findings These results were communicated to Dr. Pearlean BrownieSethi At 3:58 pmon 04/13/2021by text page via the [facility] messaging system. IMPRESSION: CTA neck: The bilateral common carotid, internal carotid and vertebral arteries are patent within the neck without significant stenosis. CTA head: 1. No intracranial large vessel occlusion or proximal high-grade arterial stenosis. 2. No evidence of vascular malformation on the current exam. However, consider imaging follow-up once the hematoma involutes for further assessment. 3. No contrast blush is demonstrated in the region of the acute left frontal lobe hematoma on arterial phase imaging to suggest active extravasation. 4. No intracranial aneurysm is identified. Electronically Signed   By: Jackey LogeKyle  Golden DO   On: 02/22/2020 15:59   CT Code Stroke CTA Neck W/WO contrast  Result  Date: 02/06/2020 CLINICAL DATA:  Neuro deficit, acute, stroke suspected. Additional history provided: Unresponsive, left gaze, ri right-sided flaccid. EXAM: CT ANGIOGRAPHY HEAD AND NECK TECHNIQUE: Multidetector CT imaging of the head and neck was performed using the standard protocol during bolus administration of intravenous contrast. Multiplanar CT image reconstructions and MIPs were obtained to evaluate the vascular anatomy. Carotid stenosis measurements (when applicable) are obtained utilizing NASCET criteria, using the distal internal carotid diameter as the denominator. CONTRAST:  75mL OMNIPAQUE IOHEXOL 350 MG/ML SOLN COMPARISON:  Concurrently performed non-contrast head CT. FINDINGS: CTA NECK FINDINGS Aortic arch: Standard aortic branching. The visualized aortic arch is unremarkable. No significant innominate or proximal subclavian artery stenosis. Right carotid system: CCA and ICA patent within the neck without stenosis. Left carotid system: CCA and ICA patent within the neck without stenosis. Vertebral arteries: The vertebral arteries are codominant and patent within the neck without significant stenosis. Skeleton: No acute bony abnormality or aggressive osseous lesion. Cervical spondylosis without high-grade bony spinal canal narrowing. Other neck: No neck mass or cervical lymphadenopathy. Upper chest: No consolidation within the imaged lung apices. Review of the MIP images confirms the above findings CTA HEAD FINDINGS Anterior circulation: The intracranial internal carotid arteries are patent without significant stenosis. The M1 middle cerebral arteries are patent without significant stenosis. No M2 proximal branch occlusion or high-grade proximal stenosis is identified. The anterior cerebral arteries are patent without significant proximal stenosis. No intracranial aneurysm is identified. There is no evidence of vascular malformation on the current exam. No contrast blush is seen on arterial phase  imaging to suggest active arterial extravasation at site of the acute left frontal lobe parenchymal hematoma. Posterior circulation: The intracranial vertebral arteries are patent without significant stenosis, as is the basilar artery. The bilateral posterior cerebral arteries are patent without significant proximal stenosis. Posterior communicating arteries are poorly delineated and may be hypoplastic or absent bilaterally. Venous sinuses: Within limitations of contrast timing, no convincing thrombus. Anatomic variants: As described. Review of the MIP images confirms the above findings These results were communicated to Dr. Pearlean BrownieSethi At 3:58 pmon 4/16/2021by text page via the [facility] messaging system. IMPRESSION: CTA  neck: The bilateral common carotid, internal carotid and vertebral arteries are patent within the neck without significant stenosis. CTA head: 1. No intracranial large vessel occlusion or proximal high-grade arterial stenosis. 2. No evidence of vascular malformation on the current exam. However, consider imaging follow-up once the hematoma involutes for further assessment. 3. No contrast blush is demonstrated in the region of the acute left frontal lobe hematoma on arterial phase imaging to suggest active extravasation. 4. No intracranial aneurysm is identified. Electronically Signed   By: Kellie Simmering DO   On: 02/19/2020 15:59   CT HEAD CODE STROKE WO CONTRAST  Result Date: 02/16/2020 CLINICAL DATA:  Code stroke. Neuro deficit, acute, stroke suspected. Additional history provided: Unresponsive, left gaze, right-sided flaccid. EXAM: CT HEAD WITHOUT CONTRAST TECHNIQUE: Contiguous axial images were obtained from the base of the skull through the vertex without intravenous contrast. COMPARISON:  No pertinent prior studies available for comparison. FINDINGS: Brain: There is a very large acute parenchymal hemorrhage centered within the left frontal lobe which measures 8.2 x 6.5 x 5.6 cm. There is surrounding  parenchymal edema. There is mild intraventricular extension into the left foramen of Monro, third ventricle and cerebral aqueduct. Associated mass effect with significant partial effacement of the left lateral ventricle. 5 mm rightward midline shift measured at the level of the septum pellucidum. No hydrocephalus or evidence of ventricular entrapment on the current exam. Elsewhere the brain appears unremarkable. There is no extra-axial fluid collection. Vascular: No hyperdense vessel. Skull: Normal. Negative for fracture or focal lesion. Sinuses/Orbits: Visualized orbits demonstrate no acute abnormality. Mild ethmoid sinus mucosal thickening. No significant mastoid effusion. IMPRESSION: Very large acute parenchymal hemorrhage centered within the left frontal lobe measuring 8.2 x 6.5 x 5.6 cm. Surrounding parenchymal edema. Mild intraventricular extension of hemorrhage into the left foramen of Monro, third ventricle and cerebral aqueduct. Associated mass effect with partial effacement of the left lateral ventricle. 5 mm rightward midline shift at the level of the septum pellucidum. Electronically Signed   By: Kellie Simmering DO   On: 02/08/2020 15:40       Laurey Morale, MSN, NP-C [facility] [phone]  02/04/2020, 3:19 PM   Attending physician note to follow with Assessment and plan .   Assessment: 64 y.o. female with HLD, HTN presented to [facility] ED as a code stroke for decreased mental status, left gaze and right side flaccid. CTH showed a large left frontal intraparenchymal hemorrhage with cytotoxic edema midline shift. CTA no aneurysm.  Stroke Risk Factors - hyperlipidemia and hypertension    ICH -- BP goal :SBP < 140 into 24 hours and then below 160; labetalol and cleviprex to keep BP below goal. -- Neurosurgery consult to consider craniotomy --3% hypertonic saline @ 23ml/hr for cerebral edema after 30 cc of 23.4% saline bolus. --MRI brain later when stable --CTA (  completed) -ECHO -PT/OT/ SLP -NPO until pass stroke swallow screen - frequent neuro checks - repeat Head CT GI prophylaxis with Protonix DVT prophylaxis with SCDs   HTN --SBP< 140 Labetalol/ cleviprex   Cytotoxic cerebral edema 3% hypertonic saline @ 75ml/hr  DVT prophylaxis SCD's only; no anticoagulation I have personally obtained history,examined this patient, reviewed notes, independently viewed imaging studies, participated in medical decision making and plan of care.ROS completed by me personally and pertinent positives fully documented  I have made any additions or clarifications directly to the above note.. Patient has presented with a large left frontal supratentorial parenchymal hemorrhage with cytotoxic edema and midline shift and  early signs of brain herniation.  Recommend emergent intubation for airway protection as well as strict blood pressure control with systolic goal below 140 for 24 hours and then below 160.  Hypertonic saline drip with serum sodium goal 150-155.  DVT and GI prophylaxis.  Keep normothermic, euglycemic and euvolemic.  Discussed with neurosurgeon on-call Dr. Conchita Paris to consider decompressive craniotomy.  Called patient's sister Meredith Cooper on the phone and gave update on patient's conditions and plan of care and answered questions. This patient is critically ill and at significant risk of neurological worsening, death and care requires constant monitoring of vital signs, hemodynamics,respiratory and cardiac monitoring, extensive review of multiple databases, frequent neurological assessment, discussion with family, other specialists and medical decision making of high complexity.I have made any additions or clarifications directly to the above note.This critical care time does not reflect procedure time, or teaching time or supervisory time of PA/NP/Med Resident etc but could involve care discussion time.  I spent 80 minutes of neurocritical care time  in  the care of  this patient.     Delia Heady, MD Medical Director [facility] Pager: [phone] 02/05/2020 4:29 PM  --please page stroke NP  Or  PA  Or MD from 8am -4 pm  as this patient from this time will be  followed by the stroke.   You can look them up on [email or web address]  Password [email or web address]

## 2020-02-09 NOTE — ED Provider Notes (Signed)
[facility]TH NEURO/TRAUMA/SURGICAL ICU Provider Note   CSN: [id number] Arrival date & time: 02/18/2020  1515  An emergency department physician performed an initial assessment on this suspected stroke patient at 1514.  History Chief Complaint  Patient presents with  . Code Stroke    Meredith Cooper is a 64 y.o. female.  HPI    Level 5 caveat for unresponsive patient.  64 year old female with history of hypertension and hyperlipidemia comes in a chief complaint of sudden onset weakness and difficulty speaking.  According to elements, EMS, patient was carrying a box at a greenhouse when she dropped a box and collapsed.  On EMS arrival she was found unresponsive with left-sided gaze.  Patient is following commands but she is unable to speak and she is noted to have weakness on the left side.  Code stroke was activated on the field and neurology team is at the bedside.  Past Medical History:  Diagnosis Date  . AR (allergic rhinitis)   . GERD (gastroesophageal reflux disease)   . Hiatal hernia   . Hyperlipemia   . Hypertension   . Thyroid disease   . Vaginal prolapse     Patient Active Problem List   Diagnosis Date Noted  . Stroke ([place]) 02/05/2020  . Obesity (BMI 30-39.9) 12/18/2018  . Post-menopause on HRT (hormone replacement therapy) 12/18/2018  . Essential hypertension 12/18/2018  . Hyperlipidemia 12/18/2018  . Incomplete bladder emptying 12/18/2018  . 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 Rectocele 12/10/2017  . Cystocele, midline 12/10/2017  . Status post hysterectomy 12/09/2016  . Pessary maintenance 04/22/2016  . SUI (stress urinary incontinence, female) 04/22/2016    Past Surgical History:  Procedure Laterality Date  . ABDOMINAL HYSTERECTOMY  2000   [place] [facility]- bvd  . BREAST BIOPSY    . VEIN LIGATION AND STRIPPING       OB History    Gravida  1   Para  1   Term  1   Preterm      AB      Living  1     SAB      TAB      Ectopic      Multiple      Live  Births  1           Family History  Problem Relation Age of Onset  . Bone cancer Mother   . Bladder Cancer Sister   . Colon cancer Sister   . Diabetes Brother   . Ovarian cancer Maternal Grandmother   . Heart disease Neg Hx   . Breast cancer Neg Hx     Social History   Tobacco Use  . Smoking status: Never Smoker  . Smokeless tobacco: Never Used  Substance Use Topics  . Alcohol use: No  . Drug use: No    Home Medications Prior to Admission medications   Medication Sig Start Date End Date Taking? Authorizing Provider  azelastine (ASTELIN) 0.1 % nasal spray Place into both nostrils 2 (two) times daily. Use in each nostril as directed    [provider]  Bacillus Coagulans-Inulin (PROBIOTIC) 1-250 BILLION-MG CAPS Take by mouth.    [provider]  Cetirizine HCl 10 MG CAPS Take by mouth.    [provider]  conjugated estrogens (PREMARIN) vaginal cream Insert 1 g intravaginal weekly Patient not taking: Reported on 12/20/2019 12/17/18   Rubie Maid, MD  estradiol (ESTRACE) 1 MG tablet Take 1 tablet (1 mg total) by mouth daily. 12/01/19  Rubie Maid, MD  fluticasone [facility]) 50 MCG/ACT nasal spray Place 2 sprays into both nostrils daily.     [provider]  levocetirizine (XYZAL) 5 MG tablet Take 5 mg by mouth every evening.    [provider]  lisinopril (PRINIVIL,ZESTRIL) 10 MG tablet  12/31/17   [provider]  montelukast (SINGULAIR) 10 MG tablet Take 10 mg by mouth at bedtime.    [provider]  Multiple Vitamins-Minerals (EQ VISION FORMULA 50+ PO) Take by mouth.    [provider]  omega-3 fish oil (MAXEPA) 1000 MG CAPS capsule Take by mouth.    [provider]  omeprazole (PRILOSEC) 20 MG capsule Take 20 mg by mouth daily.    [provider]  OXYQUINOLONE SULFATE VAGINAL (TRIMO-SAN) 0.025 % GEL Place 1 g vaginally once a week. Patient not taking: Reported on 12/20/2019 05/19/18    Defrancesco, Alanda Slim, MD  pravastatin (PRAVACHOL) 20 MG tablet Take 20 mg by mouth daily.    [provider]  Resveratrol 100 MG CAPS Take by mouth.    [provider]  TRIMO-SAN 0.025-0.01 % GEL  05/20/18   [provider]    Allergies    Penicillins  Review of Systems   Review of Systems  Unable to perform ROS: Patient nonverbal    Physical Exam Updated Vital Signs BP (!) 142/66   Pulse 70   Temp (!) 96.8 F (36 C)   Resp 20   Ht 5' 4" (1.626 m)   Wt 109.3 kg   SpO2 100%   BMI 41.36 kg/m   Physical Exam Vitals and nursing note reviewed.  Constitutional:      Appearance: She is well-developed.  HENT:     Head: Atraumatic.  Cardiovascular:     Rate and Rhythm: Normal rate.  Pulmonary:     Effort: Pulmonary effort is normal.  Abdominal:     General: Bowel sounds are normal.  Musculoskeletal:     Cervical back: Neck supple.  Skin:    General: Skin is warm and dry.  Neurological:     Mental Status: She is alert.     Comments: Patient has left-sided upper and lower extremity hemiplegia.  Pupils are 3 and equal, sluggishly reactive.  Patient is following simple commands     ED Results / Procedures / Treatments   Labs (all labs ordered are listed, but only abnormal results are displayed) Labs Reviewed  CBC - Abnormal; Notable for the following components:      Result Value   WBC 11.6 (*)    Hemoglobin 10.8 (*)    HCT 35.5 (*)    MCH 25.8 (*)    All other components within normal limits  DIFFERENTIAL - Abnormal; Notable for the following components:   Lymphs Abs 4.4 (*)    All other components within normal limits  COMPREHENSIVE METABOLIC PANEL - Abnormal; Notable for the following components:   Potassium 3.4 (*)    Glucose, Bld 132 (*)    Albumin 3.4 (*)    All other components within normal limits  URINALYSIS, ROUTINE W REFLEX MICROSCOPIC - Abnormal; Notable for the following components:   Color, Urine STRAW (*)    Ketones,  ur 20 (*)    All other components within normal limits  GLUCOSE, CAPILLARY - Abnormal; Notable for the following components:   Glucose-Capillary 165 (*)    All other components within normal limits  CBG MONITORING, ED - Abnormal; Notable for the following components:  Glucose-Capillary 127 (*)    All other components within normal limits  I-STAT CHEM 8, ED - Abnormal; Notable for the following components:   Potassium 3.4 (*)    Glucose, Bld 129 (*)    Calcium, Ion 1.12 (*)    Hemoglobin 11.2 (*)    HCT 33.0 (*)    All other components within normal limits  POCT I-STAT 7, (LYTES, BLD GAS, ICA,H+H) - Abnormal; Notable for the following components:   pO2, Arterial 423.0 (*)    Potassium 3.4 (*)    HCT 30.0 (*)    Hemoglobin 10.2 (*)    All other components within normal limits  RESPIRATORY PANEL BY RT PCR (FLU A&B, COVID)  MRSA PCR SCREENING  ETHANOL  PROTIME-INR  APTT  RAPID URINE DRUG SCREEN, HOSP PERFORMED  HIV ANTIBODY (ROUTINE TESTING W REFLEX)  SODIUM  TRIGLYCERIDES  HEMOGLOBIN A1C  SODIUM  BASIC METABOLIC PANEL  MAGNESIUM  PHOSPHORUS  CBC    EKG None  Radiology CT Code Stroke CTA Head W/WO contrast  Result Date: 02/17/2020 CLINICAL DATA:  Neuro deficit, acute, stroke suspected. Additional history provided: Unresponsive, left gaze, ri right-sided flaccid. EXAM: CT ANGIOGRAPHY HEAD AND NECK TECHNIQUE: Multidetector CT imaging of the head and neck was performed using the standard protocol during bolus administration of intravenous contrast. Multiplanar CT image reconstructions and MIPs were obtained to evaluate the vascular anatomy. Carotid stenosis measurements (when applicable) are obtained utilizing NASCET criteria, using the distal internal carotid diameter as the denominator. CONTRAST:  33m OMNIPAQUE IOHEXOL 350 MG/ML SOLN COMPARISON:  Concurrently performed non-contrast head CT. FINDINGS: CTA NECK FINDINGS Aortic arch: Standard aortic branching. The visualized  aortic arch is unremarkable. No significant innominate or proximal subclavian artery stenosis. Right carotid system: CCA and ICA patent within the neck without stenosis. Left carotid system: CCA and ICA patent within the neck without stenosis. Vertebral arteries: The vertebral arteries are codominant and patent within the neck without significant stenosis. Skeleton: No acute bony abnormality or aggressive osseous lesion. Cervical spondylosis without high-grade bony spinal canal narrowing. Other neck: No neck mass or cervical lymphadenopathy. Upper chest: No consolidation within the imaged lung apices. Review of the MIP images confirms the above findings CTA HEAD FINDINGS Anterior circulation: The intracranial internal carotid arteries are patent without significant stenosis. The M1 middle cerebral arteries are patent without significant stenosis. No M2 proximal branch occlusion or high-grade proximal stenosis is identified. The anterior cerebral arteries are patent without significant proximal stenosis. No intracranial aneurysm is identified. There is no evidence of vascular malformation on the current exam. No contrast blush is seen on arterial phase imaging to suggest active arterial extravasation at site of the acute left frontal lobe parenchymal hematoma. Posterior circulation: The intracranial vertebral arteries are patent without significant stenosis, as is the basilar artery. The bilateral posterior cerebral arteries are patent without significant proximal stenosis. Posterior communicating arteries are poorly delineated and may be hypoplastic or absent bilaterally. Venous sinuses: Within limitations of contrast timing, no convincing thrombus. Anatomic variants: As described. Review of the MIP images confirms the above findings These results were communicated to Dr. SLeonie ManAt 3:58 pmon 4/16/2021by text page via the [facility]messaging system. IMPRESSION: CTA neck: The bilateral common carotid, internal carotid and  vertebral arteries are patent within the neck without significant stenosis. CTA head: 1. No intracranial large vessel occlusion or proximal high-grade arterial stenosis. 2. No evidence of vascular malformation on the current exam. However, consider imaging follow-up once the hematoma involutes for further  assessment. 3. No contrast blush is demonstrated in the region of the acute left frontal lobe hematoma on arterial phase imaging to suggest active extravasation. 4. No intracranial aneurysm is identified. Electronically Signed   By: Kellie Simmering DO   On: 02/08/2020 15:59   CT Code Stroke CTA Neck W/WO contrast  Result Date: 02/03/2020 CLINICAL DATA:  Neuro deficit, acute, stroke suspected. Additional history provided: Unresponsive, left gaze, ri right-sided flaccid. EXAM: CT ANGIOGRAPHY HEAD AND NECK TECHNIQUE: Multidetector CT imaging of the head and neck was performed using the standard protocol during bolus administration of intravenous contrast. Multiplanar CT image reconstructions and MIPs were obtained to evaluate the vascular anatomy. Carotid stenosis measurements (when applicable) are obtained utilizing NASCET criteria, using the distal internal carotid diameter as the denominator. CONTRAST:  35m OMNIPAQUE IOHEXOL 350 MG/ML SOLN COMPARISON:  Concurrently performed non-contrast head CT. FINDINGS: CTA NECK FINDINGS Aortic arch: Standard aortic branching. The visualized aortic arch is unremarkable. No significant innominate or proximal subclavian artery stenosis. Right carotid system: CCA and ICA patent within the neck without stenosis. Left carotid system: CCA and ICA patent within the neck without stenosis. Vertebral arteries: The vertebral arteries are codominant and patent within the neck without significant stenosis. Skeleton: No acute bony abnormality or aggressive osseous lesion. Cervical spondylosis without high-grade bony spinal canal narrowing. Other neck: No neck mass or cervical  lymphadenopathy. Upper chest: No consolidation within the imaged lung apices. Review of the MIP images confirms the above findings CTA HEAD FINDINGS Anterior circulation: The intracranial internal carotid arteries are patent without significant stenosis. The M1 middle cerebral arteries are patent without significant stenosis. No M2 proximal branch occlusion or high-grade proximal stenosis is identified. The anterior cerebral arteries are patent without significant proximal stenosis. No intracranial aneurysm is identified. There is no evidence of vascular malformation on the current exam. No contrast blush is seen on arterial phase imaging to suggest active arterial extravasation at site of the acute left frontal lobe parenchymal hematoma. Posterior circulation: The intracranial vertebral arteries are patent without significant stenosis, as is the basilar artery. The bilateral posterior cerebral arteries are patent without significant proximal stenosis. Posterior communicating arteries are poorly delineated and may be hypoplastic or absent bilaterally. Venous sinuses: Within limitations of contrast timing, no convincing thrombus. Anatomic variants: As described. Review of the MIP images confirms the above findings These results were communicated to Dr. SLeonie ManAt 3:58 pmon 04/27/2021by text page via the [facility]aging system. IMPRESSION: CTA neck: The bilateral common carotid, internal carotid and vertebral arteries are patent within the neck without significant stenosis. CTA head: 1. No intracranial large vessel occlusion or proximal high-grade arterial stenosis. 2. No evidence of vascular malformation on the current exam. However, consider imaging follow-up once the hematoma involutes for further assessment. 3. No contrast blush is demonstrated in the region of the acute left frontal lobe hematoma on arterial phase imaging to suggest active extravasation. 4. No intracranial aneurysm is identified. Electronically  Signed   By: KKellie SimmeringDO   On: 02/13/2020 15:59   DG Chest Port 1 View  Result Date: 02/15/2020 CLINICAL DATA:  Check central line placement EXAM: PORTABLE CHEST 1 VIEW COMPARISON:  Film from earlier in the same day. FINDINGS: Endotracheal tube and gastric catheter are noted. Right jugular central line is now seen with the catheter tip at the cavoatrial junction. No pneumothorax is seen. Left basilar atelectasis is now seen. No bony abnormality is noted. IMPRESSION: Increasing left basilar atelectasis. Central line as described  without pneumothorax. Electronically Signed   By: Inez Catalina M.D.   On: 01/31/2020 19:28   DG Chest Portable 1 View  Result Date: 02/08/2020 CLINICAL DATA:  Stroke-like symptoms EXAM: PORTABLE CHEST 1 VIEW COMPARISON:  None. FINDINGS: Cardiac shadow is mildly enlarged but accentuated by the portable technique. Endotracheal tube and gastric catheter are seen. Gastric catheter is noted in the distal esophagus and should be advanced several cm further into the stomach. The overall inspiratory effort is poor with crowding of the vascular markings. Mild interstitial edema cannot be totally excluded. IMPRESSION: Tubes and lines as described above. The gastric catheter lies in the distal esophagus and should be advanced several cm further into the stomach. Increased interstitial markings likely related to the poor inspiratory effort however mild edema cannot be excluded. Electronically Signed   By: Inez Catalina M.D.   On: 02/22/2020 16:33   DG Abd Portable 1V  Result Date: 01/29/2020 CLINICAL DATA:  Check gastric catheter placement EXAM: PORTABLE ABDOMEN - 1 VIEW COMPARISON:  None. FINDINGS: Gastric catheter is noted looped within the stomach. The tip projects into the fundus. No obstructive changes are seen. No free air is noted. IMPRESSION: Gastric catheter as described. Electronically Signed   By: Inez Catalina M.D.   On: 02/22/2020 19:27   CT HEAD CODE STROKE WO  CONTRAST  Result Date: 02/13/2020 CLINICAL DATA:  Code stroke. Neuro deficit, acute, stroke suspected. Additional history provided: Unresponsive, left gaze, right-sided flaccid. EXAM: CT HEAD WITHOUT CONTRAST TECHNIQUE: Contiguous axial images were obtained from the base of the skull through the vertex without intravenous contrast. COMPARISON:  No pertinent prior studies available for comparison. FINDINGS: Brain: There is a very large acute parenchymal hemorrhage centered within the left frontal lobe which measures 8.2 x 6.5 x 5.6 cm. There is surrounding parenchymal edema. There is mild intraventricular extension into the left foramen of Monro, third ventricle and cerebral aqueduct. Associated mass effect with significant partial effacement of the left lateral ventricle. 5 mm rightward midline shift measured at the level of the septum pellucidum. No hydrocephalus or evidence of ventricular entrapment on the current exam. Elsewhere the brain appears unremarkable. There is no extra-axial fluid collection. Vascular: No hyperdense vessel. Skull: Normal. Negative for fracture or focal lesion. Sinuses/Orbits: Visualized orbits demonstrate no acute abnormality. Mild ethmoid sinus mucosal thickening. No significant mastoid effusion. IMPRESSION: Very large acute parenchymal hemorrhage centered within the left frontal lobe measuring 8.2 x 6.5 x 5.6 cm. Surrounding parenchymal edema. Mild intraventricular extension of hemorrhage into the left foramen of Monro, third ventricle and cerebral aqueduct. Associated mass effect with partial effacement of the left lateral ventricle. 5 mm rightward midline shift at the level of the septum pellucidum. Electronically Signed   By: Kellie Simmering DO   On: 02/03/2020 15:40    Procedures .Critical Care Performed by: Varney Biles, MD Authorized by: Varney Biles, MD   Critical care provider statement:    Critical care time (minutes):  48   Critical care was necessary to treat  or prevent imminent or life-threatening deterioration of the following conditions:  CNS failure or compromise   Critical care was time spent personally by me on the following activities:  Discussions with consultants, evaluation of patient's response to treatment, examination of patient, ordering and performing treatments and interventions, ordering and review of laboratory studies, ordering and review of radiographic studies, pulse oximetry, re-evaluation of patient's condition, obtaining history from patient or surrogate, review of old charts, development of treatment plan with  patient or surrogate and ventilator management   I assumed direction of critical care for this patient from another provider in my specialty: yes       Please see the separate intubation procedure by the resident physician.  (including critical care time)  Medications Ordered in ED Medications   stroke: mapping our early stages of recovery book (has no administration in time range)  acetaminophen (TYLENOL) tablet 650 mg (has no administration in time range)    Or  acetaminophen (TYLENOL) 160 MG/5ML solution 650 mg (has no administration in time range)    Or  acetaminophen (TYLENOL) suppository 650 mg (has no administration in time range)  senna-docusate (Senokot-S) tablet 1 tablet (has no administration in time range)  pantoprazole (PROTONIX) injection 40 mg (has no administration in time range)  labetalol (NORMODYNE) injection 20 mg (has no administration in time range)    And  clevidipine (CLEVIPREX) infusion 0.5 mg/mL (8 mg/hr Intravenous Rate/Dose Verify 02/14/2020 1900)  sodium chloride (hypertonic) 3 % solution (75 mL/hr Intravenous New Bag/Given 02/16/2020 1926)  etomidate (AMIDATE) injection (20 mg Intravenous Given 02/02/2020 1639)  rocuronium (ZEMURON) injection (75 mg Intravenous Given 02/21/2020 1540)  potassium chloride 20 MEQ/15ML (10%) solution 20 mEq (has no administration in time range)  docusate (COLACE) 50  MG/5ML liquid 100 mg (has no administration in time range)  chlorhexidine gluconate (MEDLINE KIT) (PERIDEX) 0.12 % solution 15 mL (has no administration in time range)  MEDLINE mouth rinse (has no administration in time range)  fentaNYL (SUBLIMAZE) injection 25-100 mcg (50 mcg Intravenous Given 02/12/2020 1752)  midazolam (VERSED) injection 1 mg (has no administration in time range)  propofol (DIPRIVAN) 1000 MG/100ML infusion (0 mcg/kg/min  109.3 kg Intravenous Stopped 01/25/2020 1850)  ceFEPIme (MAXIPIME) 2 g in sodium chloride 0.9 % 100 mL IVPB (2 g Intravenous New Bag/Given 02/17/2020 1923)  insulin aspart (novoLOG) injection 0-9 Units (has no administration in time range)  famotidine (PEPCID) IVPB 20 mg premix (has no administration in time range)  chlorhexidine gluconate (MEDLINE KIT) (PERIDEX) 0.12 % solution 15 mL (has no administration in time range)  MEDLINE mouth rinse (has no administration in time range)  0.9 %  sodium chloride infusion ( Intravenous New Bag/Given 02/02/2020 1922)  ondansetron (ZOFRAN) injection 4 mg (4 mg Intravenous Given 01/30/2020 1514)  iohexol (OMNIPAQUE) 350 MG/ML injection 75 mL (75 mLs Intravenous Contrast Given 02/17/2020 1529)  sodium chloride 3% (hypertonic) IV bolus 250 mL (250 mLs Intravenous New Bag/Given 02/20/2020 1548)    ED Course  I have reviewed the triage vital signs and the nursing notes.  Pertinent labs & imaging results that were available during my care of the patient were reviewed by me and considered in my medical decision making (see chart for details).    MDM Rules/Calculators/A&P                      64 year old female comes in with chief complaint of acute altered mental status and fall. Code stroke was activated on the field.  Neuro team at the bedside.  Concerns are that patient likely had LVO versus internal hemorrhage.  Stat CT scan revealed that patient has a hemorrhagic stroke.  She has a large bleed with evidence of mass-effect. Neuro team  ordered hypertonic saline.  Neurosurgery was also consulted by them.  I followed up with neurosurgery team and for now they are considering conservative management.  Patient has been started on Cleviprex.  She has stable blood pressure.  She  was intubated because of airway protection concerns that she was vomiting. No complications from the intubation.  Family updated and notified about this event.  Husband has been escorted up to the neuro ICU.   Final Clinical Impression(s) / ED Diagnoses Final diagnoses:  Hemorrhagic stroke [facility])    Rx / DC Orders ED Discharge Orders    None       Varney Biles, MD 02/04/2020 2013

## 2020-02-09 NOTE — Procedures (Signed)
Central Venous Catheter Insertion Procedure Note Calee Nugent [id number] 08-16-1956  Procedure: Insertion of Central Venous Catheter Indications: Drug and/or fluid administration  Procedure Details Consent: Risks of procedure as well as the alternatives and risks of each were explained to the (patient/caregiver).  Consent for procedure obtained. Time Out: Verified patient identification, verified procedure, site/side was marked, verified correct patient position, special equipment/implants available, medications/allergies/relevent history reviewed, required imaging and test results available.  Performed  Maximum sterile technique was used including antiseptics, cap, gloves, gown, hand hygiene, mask and sheet. Skin prep: Chlorhexidine; local anesthetic administered A antimicrobial bonded/coated triple lumen catheter was placed in the right internal jugular vein using the Seldinger technique. Catheter placed to 15 cm. Blood aspirated via all 3 ports and then flushed x 3. Line sutured x 4 and dressing applied.  Ultrasound guidance used.Yes.    Evaluation Blood flow good Complications: No apparent complications Patient did tolerate procedure well. Chest X-ray ordered to verify placement.  CXR: pending.  Dr. Verdene Lennert Internal Medicine PGY-1  Pager: [phone] 03-08-2020, 6:47 PM

## 2020-02-09 NOTE — Progress Notes (Signed)
The chaplain assisted the family as they moved from the ED to the unit. The chaplain will follow-up if needed.  Lavone Neri Chaplain Resident For questions concerning this note please contact me by pager [phone]

## 2020-02-09 NOTE — Progress Notes (Signed)
Entered room noted pt shaking on left upper side. Requested Ativan form co workers and page neuro. Pt returned to normal before I received Ativan. Dr Arbutus Ped return call, while talking to him I noted pt temp was 96.8. Bare hugger applied and the decision was made to monitor pt at this time.

## 2020-02-09 NOTE — H&P (Signed)
Pharmacy Antibiotic Note  Meredith Cooper is a 64 y.o. female admitted on February 20, 2020 with acute respiratory failure with absent airway protection, in setting of intraparenchymal hemorrhage.  Patient vomited multiple times in CT, then required intubation for airwary protection.  Pharmacy has been consulted for cefepime dosing.   Plan: Cefepime 2g IV q8h Monitor clinical picture, renal function F/U any C&S obtained, abx deescalation / LOT  Height: 5\' 4"  (162.6 cm) Weight: 109.3 kg (240 lb 15.4 oz) IBW/kg (Calculated) : 54.7  No data recorded.  Recent Labs  Lab 02/20/2020 1522 2020-02-20 1523  WBC  --  11.6*  CREATININE 0.70 0.75    Estimated Creatinine Clearance: 83.5 mL/min (by C-G formula based on SCr of 0.75 mg/dL).    Allergies  Allergen Reactions  . Penicillins Itching and Rash    Antimicrobials this admission: Cefepime  >>  Dose adjustments this admission: Currently none  Microbiology results: 4/13 Respiratory panel: negative  Thank you for allowing pharmacy to be a part of this patient's care.  5/13 02-20-2020 5:42 PM

## 2020-02-09 NOTE — Progress Notes (Addendum)
Patient with recurrent spells of unilateral limb jerking lasting up to 90 seconds. She is nonverbal with eyes open but not responding to commands. Initially felt to be due to shivering given low temperature, they have not resolved with Bair hugger. 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   Ativan 2 mg IV being administered. Also loading with 2000 mg IV Keppra, followed by scheduled dose of 500 mg IV BID. If no resolution of spells will obtain STAT LTM EEG, otherwise EEG can be started in the AM (ordered).   Electronically signed: Dr. Caryl Pina

## 2020-02-10 ENCOUNTER — Inpatient Hospital Stay (HOSPITAL_COMMUNITY): Payer: Medicare Other

## 2020-02-10 ENCOUNTER — Inpatient Hospital Stay (HOSPITAL_COMMUNITY): Payer: Medicare Other | Admitting: Anesthesiology

## 2020-02-10 ENCOUNTER — Inpatient Hospital Stay (HOSPITAL_COMMUNITY): Admission: EM | Disposition: E | Payer: Self-pay | Source: Home / Self Care | Attending: Neurology

## 2020-02-10 DIAGNOSIS — I361 Nonrheumatic tricuspid (valve) insufficiency: Secondary | ICD-10-CM

## 2020-02-10 DIAGNOSIS — E7800 Pure hypercholesterolemia, unspecified: Secondary | ICD-10-CM

## 2020-02-10 DIAGNOSIS — I10 Essential (primary) hypertension: Secondary | ICD-10-CM | POA: Diagnosis not present

## 2020-02-10 DIAGNOSIS — Z978 Presence of other specified devices: Secondary | ICD-10-CM | POA: Diagnosis not present

## 2020-02-10 DIAGNOSIS — I6389 Other cerebral infarction: Secondary | ICD-10-CM | POA: Diagnosis not present

## 2020-02-10 DIAGNOSIS — I619 Nontraumatic intracerebral hemorrhage, unspecified: Secondary | ICD-10-CM | POA: Diagnosis not present

## 2020-02-10 DIAGNOSIS — J9600 Acute respiratory failure, unspecified whether with hypoxia or hypercapnia: Secondary | ICD-10-CM | POA: Diagnosis not present

## 2020-02-10 DIAGNOSIS — J9621 Acute and chronic respiratory failure with hypoxia: Secondary | ICD-10-CM

## 2020-02-10 DIAGNOSIS — G936 Cerebral edema: Secondary | ICD-10-CM | POA: Diagnosis not present

## 2020-02-10 DIAGNOSIS — I6340 Cerebral infarction due to embolism of unspecified cerebral artery: Secondary | ICD-10-CM | POA: Diagnosis not present

## 2020-02-10 DIAGNOSIS — R569 Unspecified convulsions: Secondary | ICD-10-CM

## 2020-02-10 DIAGNOSIS — I161 Hypertensive emergency: Secondary | ICD-10-CM

## 2020-02-10 HISTORY — PX: CRANIOTOMY: SHX93

## 2020-02-10 LAB — MAGNESIUM: Magnesium: 2 mg/dL (ref 1.7–2.4)

## 2020-02-10 LAB — BASIC METABOLIC PANEL
Anion gap: 8 (ref 5–15)
BUN: 13 mg/dL (ref 8–23)
CO2: 22 mmol/L (ref 22–32)
Calcium: 8.2 mg/dL — ABNORMAL LOW (ref 8.9–10.3)
Chloride: 116 mmol/L — ABNORMAL HIGH (ref 98–111)
Creatinine, Ser: 0.82 mg/dL (ref 0.44–1.00)
GFR calc Af Amer: >60 mL/min (ref >60)
GFR calc non Af Amer: >60 mL/min (ref >60)
Glucose, Bld: 146 mg/dL — ABNORMAL HIGH (ref 70–99)
Potassium: 4.1 mmol/L (ref 3.5–5.1)
Sodium: 146 mmol/L — ABNORMAL HIGH (ref 135–145)

## 2020-02-10 LAB — LIPID PANEL
Cholesterol: 164 mg/dL (ref 0–200)
HDL: 55 mg/dL (ref >40)
LDL Cholesterol: 74 mg/dL (ref 0–99)
Total CHOL/HDL Ratio: 3 RATIO
Triglycerides: 174 mg/dL — ABNORMAL HIGH (ref <150)
VLDL: 35 mg/dL (ref 0–40)

## 2020-02-10 LAB — CBC
HCT: 32.7 % — ABNORMAL LOW (ref 36.0–46.0)
Hemoglobin: 10 g/dL — ABNORMAL LOW (ref 12.0–15.0)
MCH: 26 pg (ref 26.0–34.0)
MCHC: 30.6 g/dL (ref 30.0–36.0)
MCV: 85.2 fL (ref 80.0–100.0)
Platelets: 258 10*3/uL (ref 150–400)
RBC: 3.84 MIL/uL — ABNORMAL LOW (ref 3.87–5.11)
RDW: 14.3 % (ref 11.5–15.5)
WBC: 14.2 10*3/uL — ABNORMAL HIGH (ref 4.0–10.5)
nRBC: 0 % (ref 0.0–0.2)

## 2020-02-10 LAB — ECHOCARDIOGRAM COMPLETE
Height: 64 in
Weight: 3855.4 oz

## 2020-02-10 LAB — GLUCOSE, CAPILLARY
Glucose-Capillary: 140 mg/dL — ABNORMAL HIGH (ref 70–99)
Glucose-Capillary: 118 mg/dL — ABNORMAL HIGH (ref 70–99)
Glucose-Capillary: 121 mg/dL — ABNORMAL HIGH (ref 70–99)
Glucose-Capillary: 130 mg/dL — ABNORMAL HIGH (ref 70–99)
Glucose-Capillary: 166 mg/dL — ABNORMAL HIGH (ref 70–99)
Glucose-Capillary: 119 mg/dL — ABNORMAL HIGH (ref 70–99)

## 2020-02-10 LAB — SODIUM
Sodium: 148 mmol/L — ABNORMAL HIGH (ref 135–145)
Sodium: 154 mmol/L — ABNORMAL HIGH (ref 135–145)
Sodium: 157 mmol/L — ABNORMAL HIGH (ref 135–145)

## 2020-02-10 LAB — PREPARE RBC (CROSSMATCH)

## 2020-02-10 LAB — ABO/RH: ABO/RH(D): O NEG

## 2020-02-10 LAB — PHOSPHORUS: Phosphorus: 1.7 mg/dL — ABNORMAL LOW (ref 2.5–4.6)

## 2020-02-10 SURGERY — CRANIOTOMY HEMATOMA EVACUATION SUBDURAL
Anesthesia: General | Site: Head | Laterality: Left

## 2020-02-10 MED ORDER — LEVOTHYROXINE SODIUM 112 MCG PO TABS
112.0000 ug | ORAL_TABLET | Freq: Every day | ORAL | Status: DC
  Administered 2020-02-11 – 2020-02-22 (×12): 112 ug
  Filled 2020-02-10 (×12): qty 1

## 2020-02-10 MED ORDER — HYDRALAZINE HCL 20 MG/ML IJ SOLN
10.0000 mg | INTRAMUSCULAR | Status: DC | PRN
  Administered 2020-02-10: 40 mg via INTRAVENOUS
  Filled 2020-02-10: qty 2

## 2020-02-10 MED ORDER — 0.9 % SODIUM CHLORIDE (POUR BTL) OPTIME
TOPICAL | Status: DC | PRN
  Administered 2020-02-10 (×2): 1000 mL

## 2020-02-10 MED ORDER — SODIUM CHLORIDE 23.4 % INJECTION (4 MEQ/ML) FOR IV ADMINISTRATION
120.0000 meq | Freq: Once | INTRAVENOUS | Status: AC
  Administered 2020-02-10: 120 meq via INTRAVENOUS
  Filled 2020-02-10: qty 30

## 2020-02-10 MED ORDER — SODIUM CHLORIDE 0.9 % IV SOLN
INTRAVENOUS | Status: DC | PRN
  Administered 2020-02-10: 500 mL

## 2020-02-10 MED ORDER — SODIUM CHLORIDE 0.9% FLUSH
10.0000 mL | Freq: Two times a day (BID) | INTRAVENOUS | Status: DC
  Administered 2020-02-11: 10 mL
  Administered 2020-02-11: 40 mL
  Administered 2020-02-12 (×2): 10 mL
  Administered 2020-02-13: 20 mL
  Administered 2020-02-14 – 2020-02-16 (×4): 10 mL
  Administered 2020-02-16: 20 mL
  Administered 2020-02-17 – 2020-02-19 (×5): 10 mL
  Administered 2020-02-21: 20 mL
  Administered 2020-02-21 (×2): 10 mL
  Administered 2020-02-22: 20 mL
  Administered 2020-02-25 – 2020-02-26 (×3): 10 mL

## 2020-02-10 MED ORDER — FENTANYL CITRATE (PF) 250 MCG/5ML IJ SOLN
INTRAMUSCULAR | Status: DC | PRN
  Administered 2020-02-10 (×5): 50 ug via INTRAVENOUS

## 2020-02-10 MED ORDER — CHLORHEXIDINE GLUCONATE CLOTH 2 % EX PADS
6.0000 | MEDICATED_PAD | Freq: Every day | CUTANEOUS | Status: DC
  Administered 2020-02-12 – 2020-02-21 (×11): 6 via TOPICAL

## 2020-02-10 MED ORDER — SODIUM CHLORIDE 0.9 % IV SOLN
0.5000 mg/min | INTRAVENOUS | Status: DC
  Administered 2020-02-10 – 2020-02-11 (×2): 0.5 mg/min via INTRAVENOUS
  Filled 2020-02-10 (×2): qty 100

## 2020-02-10 MED ORDER — THROMBIN 5000 UNITS EX SOLR
CUTANEOUS | Status: AC
  Filled 2020-02-10: qty 5000

## 2020-02-10 MED ORDER — ROCURONIUM BROMIDE 10 MG/ML (PF) SYRINGE
PREFILLED_SYRINGE | INTRAVENOUS | Status: AC
  Filled 2020-02-10: qty 20

## 2020-02-10 MED ORDER — VITAL HIGH PROTEIN PO LIQD
1000.0000 mL | ORAL | Status: DC
  Administered 2020-02-10 – 2020-02-14 (×6): 1000 mL

## 2020-02-10 MED ORDER — SODIUM CHLORIDE 3 % IV SOLN
INTRAVENOUS | Status: DC
  Administered 2020-02-10 – 2020-02-11 (×2): 50 mL/h via INTRAVENOUS
  Filled 2020-02-10 (×2): qty 500

## 2020-02-10 MED ORDER — BACITRACIN ZINC 500 UNIT/GM EX OINT
TOPICAL_OINTMENT | CUTANEOUS | Status: AC
  Filled 2020-02-10: qty 28.35

## 2020-02-10 MED ORDER — LORAZEPAM 2 MG/ML IJ SOLN
2.0000 mg | Freq: Once | INTRAMUSCULAR | Status: AC
  Administered 2020-02-10: 2 mg via INTRAVENOUS
  Filled 2020-02-10: qty 1

## 2020-02-10 MED ORDER — ROCURONIUM BROMIDE 100 MG/10ML IV SOLN
INTRAVENOUS | Status: DC | PRN
  Administered 2020-02-10 (×3): 50 mg via INTRAVENOUS

## 2020-02-10 MED ORDER — THROMBIN 20000 UNITS EX SOLR
CUTANEOUS | Status: DC | PRN
  Administered 2020-02-10: 20 mL via TOPICAL

## 2020-02-10 MED ORDER — VANCOMYCIN HCL IN DEXTROSE 1-5 GM/200ML-% IV SOLN
INTRAVENOUS | Status: AC
  Filled 2020-02-10: qty 200

## 2020-02-10 MED ORDER — FENTANYL CITRATE (PF) 250 MCG/5ML IJ SOLN
INTRAMUSCULAR | Status: AC
  Filled 2020-02-10: qty 5

## 2020-02-10 MED ORDER — SODIUM PHOSPHATES 45 MMOLE/15ML IV SOLN
10.0000 mmol | Freq: Once | INTRAVENOUS | Status: AC
  Administered 2020-02-10: 10 mmol via INTRAVENOUS
  Filled 2020-02-10: qty 3.33

## 2020-02-10 MED ORDER — THROMBIN 20000 UNITS EX SOLR
CUTANEOUS | Status: AC
  Filled 2020-02-10: qty 20000

## 2020-02-10 MED ORDER — SENNOSIDES-DOCUSATE SODIUM 8.6-50 MG PO TABS
1.0000 | ORAL_TABLET | Freq: Two times a day (BID) | ORAL | Status: DC
  Administered 2020-02-11 – 2020-02-20 (×16): 1
  Filled 2020-02-10 (×17): qty 1

## 2020-02-10 MED ORDER — HEMOSTATIC AGENTS (NO CHARGE) OPTIME
TOPICAL | Status: DC | PRN
  Administered 2020-02-10: 1 via TOPICAL

## 2020-02-10 MED ORDER — BACITRACIN ZINC 500 UNIT/GM EX OINT
TOPICAL_OINTMENT | CUTANEOUS | Status: DC | PRN
  Administered 2020-02-10: 1 via TOPICAL

## 2020-02-10 MED ORDER — PERFLUTREN LIPID MICROSPHERE
1.0000 mL | INTRAVENOUS | Status: DC | PRN
  Administered 2020-02-10: 3 mL via INTRAVENOUS
  Filled 2020-02-10: qty 10

## 2020-02-10 MED ORDER — SODIUM CHLORIDE 0.9% FLUSH: 10.0000 mL | INTRAVENOUS | Status: DC | PRN

## 2020-02-10 MED ORDER — BUPIVACAINE HCL (PF) 0.5 % IJ SOLN
INTRAMUSCULAR | Status: AC
  Filled 2020-02-10: qty 30

## 2020-02-10 MED ORDER — PHENYLEPHRINE HCL-NACL 10-0.9 MG/250ML-% IV SOLN
INTRAVENOUS | Status: DC | PRN
  Administered 2020-02-10: 20 ug/min via INTRAVENOUS

## 2020-02-10 MED ORDER — LIDOCAINE-EPINEPHRINE 1 %-1:100000 IJ SOLN
INTRAMUSCULAR | Status: DC | PRN
  Administered 2020-02-10: 3.5 mL via INTRADERMAL

## 2020-02-10 MED ORDER — SODIUM CHLORIDE 0.9% IV SOLUTION: Freq: Once | INTRAVENOUS | Status: DC

## 2020-02-10 MED ORDER — THROMBIN 5000 UNITS EX SOLR
OROMUCOSAL | Status: DC | PRN
  Administered 2020-02-10 (×2): 5 mL via TOPICAL

## 2020-02-10 MED ORDER — BUPIVACAINE HCL (PF) 0.5 % IJ SOLN
INTRAMUSCULAR | Status: DC | PRN
  Administered 2020-02-10: 3.5 mL

## 2020-02-10 MED ORDER — LIDOCAINE-EPINEPHRINE 1 %-1:100000 IJ SOLN
INTRAMUSCULAR | Status: AC
  Filled 2020-02-10: qty 1

## 2020-02-10 MED ORDER — LEVETIRACETAM IN NACL 500 MG/100ML IV SOLN
500.0000 mg | Freq: Two times a day (BID) | INTRAVENOUS | Status: DC
  Administered 2020-02-11 – 2020-02-14 (×8): 500 mg via INTRAVENOUS
  Filled 2020-02-10 (×7): qty 100

## 2020-02-10 MED ORDER — PRO-STAT SUGAR FREE PO LIQD
60.0000 mL | Freq: Three times a day (TID) | ORAL | Status: DC
  Administered 2020-02-10 – 2020-02-14 (×12): 60 mL
  Filled 2020-02-10 (×12): qty 60

## 2020-02-10 MED ORDER — PANTOPRAZOLE SODIUM 40 MG PO PACK
40.0000 mg | PACK | Freq: Every day | ORAL | Status: DC
  Administered 2020-02-11 – 2020-02-21 (×11): 40 mg
  Filled 2020-02-10 (×10): qty 20

## 2020-02-10 MED ORDER — LORAZEPAM 2 MG/ML IJ SOLN: 1.0000 mg | Freq: Once | INTRAMUSCULAR | Status: DC

## 2020-02-10 MED ORDER — PROPOFOL 10 MG/ML IV BOLUS
INTRAVENOUS | Status: AC
  Filled 2020-02-10: qty 40

## 2020-02-10 MED ORDER — LEVETIRACETAM 500 MG PO TABS: 500.0000 mg | ORAL_TABLET | Freq: Two times a day (BID) | ORAL | Status: DC

## 2020-02-10 MED ORDER — VANCOMYCIN HCL 1000 MG IV SOLR
INTRAVENOUS | Status: DC | PRN
  Administered 2020-02-10: 21:00:00 1000 mg via INTRAVENOUS

## 2020-02-10 MED ORDER — SODIUM CHLORIDE 0.9 % IV SOLN: INTRAVENOUS | Status: DC | PRN

## 2020-02-10 SURGICAL SUPPLY — 72 items
BENZOIN TINCTURE PRP APPL 2/3 (GAUZE/BANDAGES/DRESSINGS) IMPLANT
BLADE CLIPPER SURG (BLADE) ×3 IMPLANT
BNDG GAUZE ELAST 4 BULKY (GAUZE/BANDAGES/DRESSINGS) IMPLANT
BUR ACORN 6.0 PRECISION (BURR) ×2 IMPLANT
BUR ACORN 6.0MM PRECISION (BURR) ×1
BUR MATCHSTICK NEURO 3.0 LAGG (BURR) IMPLANT
BUR SPIRAL ROUTER 2.3 (BUR) IMPLANT
BUR SPIRAL ROUTER 2.3MM (BUR)
CANISTER SUCT 3000ML PPV (MISCELLANEOUS) ×3 IMPLANT
CARTRIDGE OIL MAESTRO DRILL (MISCELLANEOUS) ×1 IMPLANT
CLIP VESOCCLUDE MED 6/CT (CLIP) IMPLANT
COVER BURR HOLE UNIV 10 (Orthopedic Implant) ×6 IMPLANT
COVER WAND RF STERILE (DRAPES) ×3 IMPLANT
DIFFUSER DRILL AIR PNEUMATIC (MISCELLANEOUS) ×3 IMPLANT
DRAPE NEUROLOGICAL W/INCISE (DRAPES) ×3 IMPLANT
DRAPE SURG 17X23 STRL (DRAPES) IMPLANT
DRAPE WARM FLUID 44X44 (DRAPES) ×3 IMPLANT
DRSG TELFA 3X8 NADH (GAUZE/BANDAGES/DRESSINGS) ×3 IMPLANT
DURAPREP 6ML APPLICATOR 50/CS (WOUND CARE) ×3 IMPLANT
ELECT REM PT RETURN 9FT ADLT (ELECTROSURGICAL) ×3
ELECTRODE REM PT RTRN 9FT ADLT (ELECTROSURGICAL) ×1 IMPLANT
EVACUATOR 1/8 PVC DRAIN (DRAIN) IMPLANT
EVACUATOR SILICONE 100CC (DRAIN) IMPLANT
GAUZE 4X4 16PLY RFD (DISPOSABLE) IMPLANT
GAUZE SPONGE 4X4 12PLY STRL (GAUZE/BANDAGES/DRESSINGS) ×3 IMPLANT
GLOVE BIO SURGEON STRL SZ 6.5 (GLOVE) ×2 IMPLANT
GLOVE BIO SURGEON STRL SZ7 (GLOVE) ×6 IMPLANT
GLOVE BIO SURGEON STRL SZ7.5 (GLOVE) IMPLANT
GLOVE BIO SURGEONS STRL SZ 6.5 (GLOVE) ×1
GLOVE BIOGEL PI IND STRL 7.5 (GLOVE) ×2 IMPLANT
GLOVE BIOGEL PI INDICATOR 7.5 (GLOVE) ×4
GLOVE ECLIPSE 7.0 STRL STRAW (GLOVE) ×6 IMPLANT
GLOVE EXAM NITRILE XL STR (GLOVE) IMPLANT
GOWN STRL REUS W/ TWL LRG LVL3 (GOWN DISPOSABLE) ×2 IMPLANT
GOWN STRL REUS W/ TWL XL LVL3 (GOWN DISPOSABLE) IMPLANT
GOWN STRL REUS W/TWL 2XL LVL3 (GOWN DISPOSABLE) IMPLANT
GOWN STRL REUS W/TWL LRG LVL3 (GOWN DISPOSABLE) ×4
GOWN STRL REUS W/TWL XL LVL3 (GOWN DISPOSABLE)
HEMOSTAT POWDER KIT SURGIFOAM (HEMOSTASIS) ×3 IMPLANT
HEMOSTAT SURGICEL 2X14 (HEMOSTASIS) IMPLANT
KIT BASIN OR (CUSTOM PROCEDURE TRAY) ×3 IMPLANT
KIT TURNOVER KIT B (KITS) ×3 IMPLANT
NEEDLE HYPO 22GX1.5 SAFETY (NEEDLE) ×3 IMPLANT
NS IRRIG 1000ML POUR BTL (IV SOLUTION) ×3 IMPLANT
OIL CARTRIDGE MAESTRO DRILL (MISCELLANEOUS) ×3
PACK CRANIOTOMY CUSTOM (CUSTOM PROCEDURE TRAY) ×3 IMPLANT
PATTIES SURGICAL .5 X.5 (GAUZE/BANDAGES/DRESSINGS) IMPLANT
PATTIES SURGICAL .5 X3 (DISPOSABLE) IMPLANT
PATTIES SURGICAL 1X1 (DISPOSABLE) IMPLANT
PLATE UNIV CMF 16 2H (Plate) ×6 IMPLANT
SCREW UNIII AXS SD 1.5X4 (Screw) ×48 IMPLANT
SPONGE NEURO XRAY DETECT 1X3 (DISPOSABLE) IMPLANT
SPONGE SURGIFOAM ABS GEL 100 (HEMOSTASIS) ×3 IMPLANT
STAPLER VISISTAT 35W (STAPLE) ×3 IMPLANT
STOCKINETTE 6  STRL (DRAPES) ×2
STOCKINETTE 6 STRL (DRAPES) ×1 IMPLANT
SUT ETHILON 3 0 FSL (SUTURE) IMPLANT
SUT ETHILON 3 0 PS 1 (SUTURE) IMPLANT
SUT NURALON 4 0 TR CR/8 (SUTURE) ×3 IMPLANT
SUT STEEL 0 (SUTURE)
SUT STEEL 0 18XMFL TIE 17 (SUTURE) IMPLANT
SUT VIC AB 0 CT1 18XCR BRD8 (SUTURE) ×1 IMPLANT
SUT VIC AB 0 CT1 8-18 (SUTURE) ×2
SUT VIC AB 3-0 SH 8-18 (SUTURE) ×3 IMPLANT
TAPE CLOTH 1X10 TAN NS (GAUZE/BANDAGES/DRESSINGS) ×3 IMPLANT
TOWEL GREEN STERILE (TOWEL DISPOSABLE) ×3 IMPLANT
TOWEL GREEN STERILE FF (TOWEL DISPOSABLE) ×3 IMPLANT
TRAY FOLEY MTR SLVR 16FR STAT (SET/KITS/TRAYS/PACK) IMPLANT
TUBE CONNECTING 12'X1/4 (SUCTIONS) ×1
TUBE CONNECTING 12X1/4 (SUCTIONS) ×2 IMPLANT
UNDERPAD 30X30 (UNDERPADS AND DIAPERS) ×3 IMPLANT
WATER STERILE IRR 1000ML POUR (IV SOLUTION) ×3 IMPLANT

## 2020-02-10 NOTE — Progress Notes (Signed)
LTM EEG hooked up and running - no initial skin breakdown - push button tested - neuro notified.  

## 2020-02-10 NOTE — Transfer of Care (Signed)
Immediate Anesthesia Transfer of Care Note  Patient: Meredith Cooper  Procedure(s) Performed: CRANIOTOMY FOR HEMATOMA EVACUATION (Left Head)  Patient Location: ICU  Anesthesia Type:General  Level of Consciousness: Patient remains intubated per anesthesia plan  Airway & Oxygen Therapy: Patient remains intubated per anesthesia plan and Patient placed on Ventilator (see vital sign flow sheet for setting)  Post-op Assessment: Report given to RN and Post -op Vital signs reviewed and stable  Post vital signs: Reviewed and stable  Last Vitals:  Vitals Value Taken Time  BP    Temp    Pulse    Resp    SpO2      Last Pain:  Vitals:   02/07/2020 2000  TempSrc: Axillary  PainSc:          Complications: No apparent anesthesia complications

## 2020-02-10 NOTE — Progress Notes (Signed)
PT Cancellation Note  Patient Details Name: Meredith Cooper MRN: [id number] DOB: Feb 05, 1956   Cancelled Treatment:    Reason Eval/Treat Not Completed: Active bedrest order(on vent)   [name] [name] 01/27/2020, 7:17 AM [name], PT Acute Rehabilitation Services Pager: [phone] Office: [phone]

## 2020-02-10 NOTE — Progress Notes (Addendum)
1553 pt tremoring, EEG team notified, Dr Roda Shutters notified. Tremors stopped a few minutes later.

## 2020-02-10 NOTE — Progress Notes (Signed)
SLP Cancellation Note  Patient Details Name: Meredith Cooper MRN: [id number] DOB: 06-27-1956   Cancelled treatment:       Reason Eval/Treat Not Completed: Medical issues which prohibited therapy (on vent). Will f/u as able.    Mahala Menghini., M.A. CCC-SLP Acute Rehabilitation Services Pager [phone] Office [phone]  02/07/2020, 7:35 AM

## 2020-02-10 NOTE — Brief Op Note (Signed)
02/03/2020  9:44 PM  PATIENT:  Meredith Cooper  64 y.o. female  PRE-OPERATIVE DIAGNOSIS:  INTRACEREBRAL HEMORRHAGE  POST-OPERATIVE DIAGNOSIS:  INTRACEREBRAL HEMORRHAGE  PROCEDURE:  Procedure(s): CRANIOTOMY FOR HEMATOMA EVACUATION (Left)  SURGEON:  Surgeon(s) and Role:    Lisbeth Renshaw, MD - Primary  PHYSICIAN ASSISTANT: Cindra Presume, PA-C  ANESTHESIA:   general  EBL:  100 mL   BLOOD ADMINISTERED:none  DRAINS: none   LOCAL MEDICATIONS USED:  BUPIVICAINE  and LIDOCAINE   SPECIMEN:  No Specimen  DISPOSITION OF SPECIMEN:  N/A  COUNTS:  YES  TOURNIQUET:  * No tourniquets in log *  DICTATION: .Dragon Dictation  PLAN OF CARE: ICU  PATIENT DISPOSITION:  ICU - intubated and hemodynamically stable.   Delay start of Pharmacological VTE agent (>24hrs) due to surgical blood loss or risk of bleeding: yes

## 2020-02-10 NOTE — Progress Notes (Addendum)
  NEUROSURGERY PROGRESS NOTE   I have personally reviewed the MRI which does demonstrate large left peri-central IPH which tracks down to the left lateral ventricle. There is small volume IVH without HCP. Mild surrounding edema. I do not see brainstem compression or basal cistern effacement. Nonetheless, after discussion with Dr. Roda Shutters (stroke neurology), there is a significant amount of local mass effect. In addition, the hematoma does come to the cortical surface. There is no radiographic evidence to suggest underlying vascular malformation and in fact, given the other punctate diffusion restrictions on MRI, suggest that this might be hemorrhagic transformation of a embolic stroke. Upon further consideration, as surgical evacuation should be relatively straightforward, I think it may be beneficial in this case as the perioperative risk is relatively low. I did review the situation with the patient's husband and her daughter at bedside and explained the above. We did discuss the risks of surgery both this am and now to include intraoperative or postoperative bleeding, infection, and need for additional surgeries. Ultmately, I think the risk of surgery is outweighed by the potential benefit. Pts husband and daughter did consent to proceed with surgery. All their questions were answered. I have also discussed the plan with Dr. Roda Shutters who agrees.

## 2020-02-10 NOTE — Progress Notes (Addendum)
  Echocardiogram 2D Echocardiogram has been performed with Definity.  Gerda Diss 02/19/2020, 3:49 PM

## 2020-02-10 NOTE — Progress Notes (Signed)
Pt transported from 4N31 to CT and back without complication. Pts respiratory status remained stable throughout transport. RT will continue to monitor.

## 2020-02-10 NOTE — Progress Notes (Signed)
Hydralazine given for ABP over goal. Cuff BP under 140.

## 2020-02-10 NOTE — Progress Notes (Signed)
Propofol turned off at 1400, once EEG was begun.

## 2020-02-10 NOTE — Anesthesia Preprocedure Evaluation (Addendum)
Anesthesia Evaluation  Patient identified by MRN, date of birth, ID band Patient unresponsive    Reviewed: Allergy & Precautions, NPO status , Patient's Chart, lab work & pertinent test results, Unable to perform ROS - Chart review only  Airway Mallampati: Intubated       Dental   Pulmonary  Intubated      + intubated    Cardiovascular hypertension, Pt. on medications  Rhythm:Regular Rate:Normal  ECHO: 1. Left ventricular ejection fraction, by estimation, is 65 to 70%. The left ventricle has normal function. The left ventricle has no regional wall motion abnormalities. There is mild left ventricular hypertrophy. Left ventricular diastolic parameters are indeterminate. 2. Right ventricular systolic function is normal. The right ventricular size is normal. There is moderately elevated pulmonary artery systolic pressure. 3. The mitral valve is normal in structure. Trivial mitral valve regurgitation. 4. Tricuspid valve regurgitation is moderate. 5. The aortic valve is tricuspid. Aortic valve regurgitation is trivial. No aortic stenosis is present.   Neuro/Psych SedatedVery large acute parenchymal hemorrhage centered within the left frontal lobe measuring 8.2 x 6.5 x 5.6 cm; 5 mm midline shift CVA, Residual Symptoms    GI/Hepatic Neg liver ROS, hiatal hernia, GERD  Medicated,  Endo/Other  Hypothyroidism Morbid obesityHypernatremic   Renal/GU negative Renal ROS     Musculoskeletal negative musculoskeletal ROS (+)   Abdominal (+) + obese,   Peds  Hematology  (+) anemia ,   Anesthesia Other Findings INTRACEREBRAL HEMORRHAGE  Reproductive/Obstetrics                            Anesthesia Physical Anesthesia Plan  ASA: IV and emergent  Anesthesia Plan: General   Post-op Pain Management:    Induction: Intravenous and Inhalational  PONV Risk Score and Plan: 3 and Ondansetron, Dexamethasone and  Treatment may vary due to age or medical condition  Airway Management Planned: Oral ETT  Additional Equipment:   Intra-op Plan:   Post-operative Plan: Post-operative intubation/ventilation  Informed Consent:     History available from chart only  Plan Discussed with: CRNA, Anesthesiologist and Surgeon  Anesthesia Plan Comments: (Arterial line and central line in place Anesthetic plan discussed with family in ICU)      Anesthesia Quick Evaluation

## 2020-02-10 NOTE — Progress Notes (Signed)
Prelim EEG read: No seizures or epileptiform activity on EEG initiation, continue to monitor. Full report tomorrow.   Ritta Slot, MD [facility] [phone]  If 7pm- 7am, please page neurology on call as listed in AMION.

## 2020-02-10 NOTE — Progress Notes (Signed)
ABP above prescribed goal of 140. MD notified, orders requested.

## 2020-02-10 NOTE — Anesthesia Postprocedure Evaluation (Signed)
Anesthesia Post Note  Patient: Meredith Cooper  Procedure(s) Performed: CRANIOTOMY FOR HEMATOMA EVACUATION (Left Head)     Patient location during evaluation: ICU Anesthesia Type: General Level of consciousness: sedated Pain management: pain level controlled Vital Signs Assessment: post-procedure vital signs reviewed and stable Respiratory status: patient remains intubated per anesthesia plan Cardiovascular status: stable Postop Assessment: no apparent nausea or vomiting Anesthetic complications: no    Last Vitals:  Vitals:   02/14/2020 2211 02/14/2020 2215  BP: (!) 118/56 (!) 112/53  Pulse: 68 69  Resp: 20 20  Temp:  36.8 C  SpO2: 98% 98%    Last Pain:  Vitals:   01/31/2020 2215  TempSrc: Axillary  PainSc:     LLE Motor Response: No movement to painful stimulus (01/30/2020 2209)   RLE Motor Response: No movement to painful stimulus (01/25/2020 2209)        Adonijah Baena P Aminata [place]

## 2020-02-10 NOTE — Progress Notes (Addendum)
NAME:  Meredith Cooper, MRN:  [id number], DOB:  [birth year], LOS: 1 ADMISSION DATE:  02/08/2020, CONSULTATION DATE:  02/04/2020 REFERRING MD:  Pearlean Brownie - neuro, CHIEF COMPLAINT:  Code stroke - L gaze deviation, R sided weakness   Brief History   64 yo F presented to [facility] ED 4/16 as code stroke with sudden onset R sided weakness, L gaze deviation. Rapid deterioration in ED; patient vomited multiple times in CT, then required intubation for airway protection. CT H with large acute parenchymal hemorrhage in L frontal lobe, 8.2 x 6.5 x 5.6 cm, 41mm midline shift.   Past Medical History  HTN HLD Hiatal hernia GERD S/p hysterectomy   Significant Hospital Events   4/16 Code stroke, large ICH, Intubated.  4/17 overnight possible seizure activity, received ativan and started Keppra. Max on cleviprex, added PRN hydralazine for SBP goal <140  Consults:  NSGY PCCM   Procedures:  4/16 ETT >  4/16 CVC>> 4/16 Arterial line>>   Significant Diagnostic Tests:  4/16 CT H> Large acute intraparenchymal hemorrhage centered in L frontal lobe 8.2 x 6.5 x 5.6 cm, 49mm midline shift.  4/16 CTA H> no aneurysm  4/17 CT H> unchanged size of massive IPH centered in L frontal lobe. Interval increase in amount of blood in R lateral ventricle occipital horn. 9mm midline shift  4/17 CXR>>  4/17 MRI Brain >>   Micro Data:  4/16 SARS Cov2> neg   Antimicrobials:  4/16 cefepime>> added for possible aspiration event   Interim history/subjective:  Overnight possible seizure-like activity, temperature corrected and ativan given + AED per neurology.   This morning, over SBP parameters despite max dose cleviprex. PRN hydral pushes ordered, subsequently labetalol gtt added.   Propofol turned off approx 7am  FiO2 weaned to 30%   Objective   Blood pressure (!) 148/52, pulse 83, temperature (!) 100.5 F (38.1 C), temperature source Axillary, resp. rate (!) 26, height 5\' 4"  (1.626 m), weight 109.3 kg, SpO2 98 %.    Vent Mode:  PRVC FiO2 (%):  [30 %-100 %] 30 % Set Rate:  [20 bmp] 20 bmp Vt Set:  [440 mL] 440 mL PEEP:  [5 cmH20] 5 cmH20 Plateau Pressure:  [13 cmH20-15 cmH20] 15 cmH20   Intake/Output Summary (Last 24 hours) at 2020/02/14 0815 Last data filed at 02/14/20 0700 Gross per 24 hour  Intake 2231.42 ml  Output 775 ml  Net 1456.42 ml   Filed Weights   02/20/2020 1500 02/19/2020 1559  Weight: 109.3 kg 109.3 kg    Examination: General: Obese, critically ill appearing adult F, intubated and off sedation NAD  HENT: NCAT pink mmm  Trachea midline ETT secure anicteric sclera  Lungs: Symmetrical chest expansion, no accessory muscle use. Faint L sided rhonchi. Minimal secretions  Cardiovascular: RRR s1s2 bounding central and peripheral pulses  Abdomen: Obese soft round, non-distended. + bowels sounds  Extremities: BUE non-pitting edema. BLE 1+ pitting edema. No cyanosis or clubbing. No obvious joint deformity.  Neuro: Withdraws to local painful stimuli BLE and LUE. Sternal run produces fine rhythmic tremor of trapezius muscles. PERRL 48mm sluggish. Overbreathing vent  GU: Foley   Resolved Hospital Problem list     Assessment & Plan:   Acute respiratory failure with failure to protect airway, likely aspiration event, in setting of intraparenchymal hemorrhage  -intubated in ED with failed airway protection after vomiting P -Continue MV support  -AM CXR   -Pulm hygiene  -Will add cefepime for aspiration coverage given vomiting prior to ETT.  Favor short abx course and low threshold to dc.  -Will add PRN sedation, RASS goal 0. No indication for continuous.   Large ICH with cytotoxic cerebral edema  Possible seizure activity  -not surgical candidate at this time P -Neuro managing -SBP <140 until 4/17 1600, then goal SBP < 160. Labetalol gtt added -3% saline, goal Na 150-155. q6hr Na checks  -ECHO pending  -MRI 4/17 -EEG ordered -SCD only   HTN P -ICU monitoring -cleviprex gtt + labetalol gtt    -continue arterial line  Hypokalemia, improved Hypophosphatemia  P - Will order Sodium Phos 4/17 - Trend BMP mag phos, replace as needed   Leukocytosis -reactive vs aspiration  -Upward trend in temp: infectious vs neurogenic vs less likely cefepime related  P -trend CBC, fever curve  -continue empiric course of cefepime for suspected aspiration event   Hyperglycemia P -sensitive SSI   At risk malnutrition P -Advance OGT per CXR read -Initiate EN per RDN  Best practice:  Diet: EN per RDN  Pain/Anxiety/Delirium protocol (if indicated): RASS goal 0, PRN fentanyl PRN versed Propofol gtt available  VAP protocol (if indicated): yes DVT prophylaxis: SCD only  GI prophylaxis: protonix  Glucose control: SSI Mobility: BR Code Status: Full  Family Communication: Per Primary Disposition: ICU   Labs   CBC: Recent Labs  Lab Mar 09, 2020 1522 2020-03-09 1523 Mar 09, 2020 1621 02/04/2020 0347  WBC  --  11.6*  --  14.2*  NEUTROABS  --  5.8  --   --   HGB 11.2* 10.8* 10.2* 10.0*  HCT 33.0* 35.5* 30.0* 32.7*  MCV  --  84.7  --  85.2  PLT  --  307  --  258    Basic Metabolic Panel: Recent Labs  Lab 09-Mar-2020 1522 Mar 09, 2020 1522 09-Mar-2020 1523 03/09/20 1621 03-09-20 1715 09-Mar-2020 2131 02/01/2020 0347  NA 140   < > 139 142 141 142 146*  K 3.4*  --  3.4* 3.4*  --   --  4.1  CL 106  --  106  --   --   --  116*  CO2  --   --  22  --   --   --  22  GLUCOSE 129*  --  132*  --   --   --  146*  BUN 19  --  17  --   --   --  13  CREATININE 0.70  --  0.75  --   --   --  0.82  CALCIUM  --   --  9.0  --   --   --  8.2*  MG  --   --   --   --   --   --  2.0  PHOS  --   --   --   --   --   --  1.7*   < > = values in this interval not displayed.   GFR: Estimated Creatinine Clearance: 81.5 mL/min (by C-G formula based on SCr of 0.82 mg/dL). Recent Labs  Lab 2020/03/09 1523 02/18/2020 0347  WBC 11.6* 14.2*    Liver Function Tests: Recent Labs  Lab 03-09-2020 1523  AST 25  ALT 19   ALKPHOS 69  BILITOT 0.7  PROT 6.7  ALBUMIN 3.4*   No results for input(s): LIPASE, AMYLASE in the last 168 hours. No results for input(s): AMMONIA in the last 168 hours.  ABG    Component Value Date/Time   PHART 7.394 Mar 09, 2020 1621  PCO2ART 43.5 02/20/2020 1621   PO2ART 423.0 (H) 01/31/2020 1621   HCO3 26.6 01/27/2020 1621   TCO2 28 02/02/2020 1621   O2SAT 100.0 02/16/2020 1621     Coagulation Profile: Recent Labs  Lab 02/04/2020 1523  INR 0.9    Cardiac Enzymes: No results for input(s): CKTOTAL, CKMB, CKMBINDEX, TROPONINI in the last 168 hours.  HbA1C: Hgb A1c MFr Bld  Date/Time Value Ref Range Status  02/12/2020 06:30 PM 5.6 4.8 - 5.6 % Final    Comment:    (NOTE) Pre diabetes:          5.7%-6.4% Diabetes:              >6.4% Glycemic control for   <7.0% adults with diabetes     CBG: Recent Labs  Lab 02/02/2020 1515 02/21/2020 1938 02/14/2020 2336 02/20/2020 0321 01/30/2020 0743  GLUCAP 127* 165* 155* 140* 118*    Critical care time: 35 minutes     CRITICAL CARE Performed by: Cristal Generous   Total critical care time: 35 minutes  Critical care time was exclusive of separately billable procedures and treating other patients. Critical care was necessary to treat or prevent imminent or life-threatening deterioration.  Critical care was time spent personally by me on the following activities: development of treatment plan with patient and/or surrogate as well as nursing, discussions with consultants, evaluation of patient's response to treatment, examination of patient, obtaining history from patient or surrogate, ordering and performing treatments and interventions, ordering and review of laboratory studies, ordering and review of radiographic studies, pulse oximetry and re-evaluation of patient's condition.  Eliseo Gum MSN, AGACNP-BC [place] [phone] If no answer, [phone] 01/29/2020, 8:15 AM

## 2020-02-10 NOTE — Progress Notes (Signed)
More communication with MD re BP: Labetelol gtt ordered.

## 2020-02-10 NOTE — Progress Notes (Signed)
To MRI

## 2020-02-10 NOTE — Progress Notes (Signed)
  NEUROSURGERY PROGRESS NOTE   No issues overnight.   EXAM:  BP (!) 148/52   Pulse 83   Temp 100.3 F (37.9 C) (Axillary)   Resp (!) 26   Ht 5\' 4"  (1.626 m)   Wt 109.3 kg   SpO2 98%   BMI 41.36 kg/m   Grimaces to pain Pupils reactive OU (+) doll's eye reflexes Breathing over vent Purposeful LUE/LLE, not FC No movement RUE/RLE to noxious stim  IMAGING: Admission and repeat head CT reviewed demonstrating large peri-Rolandic left frontoparietal hemorrhage. There is mass effect on the left lateral ventricle and ~33mm MLS. No transtentorial herniation or effacement of basal cisterns.  CTA does not demonstrate any evidence of AVM  IMPRESSION:  64 y.o. female with large left largely cortical based hemorrhage without radiographic brainstem compression or herniation. I therefore do not believe operative hematoma evacuation is necessary. Will need f/u angiographic imaging once hematoma resolves to definitively r/o underlying vascular malformation  PLAN: - Cont supportive care per neurology

## 2020-02-10 NOTE — Progress Notes (Addendum)
STROKE TEAM PROGRESS NOTE   INTERVAL HISTORY Her husband and daughter are at the bedside.  Pt intubated on sedation, not responsive on exam. As per RN, with sedation off, she started to have b/l UE shivering, so she was back on propofol. LTM EEG has been ordered and now in process. Due to concern of seizure activity, she is on keppra after load. MRI pending but CT this am showed 37mm MLS without hematoma extension. NSG on board, no Sx needed at this time.     OBJECTIVE Vitals:   2020/02/28 0615 02/28/2020 0630 2020-02-28 0645 Feb 28, 2020 0700  BP: (!) 112/56 (!) 105/52 (!) 105/52 (!) 112/55  Pulse: 75 73 76 77  Resp: 20 20 20 20   Temp:      TempSrc:      SpO2: 100% 98% 98% 98%  Weight:      Height:        CBC:  Recent Labs  Lab 01/25/2020 1523 01/26/2020 1523 02/18/2020 1621 Feb 28, 2020 0347  WBC 11.6*  --   --  14.2*  NEUTROABS 5.8  --   --   --   HGB 10.8*   < > 10.2* 10.0*  HCT 35.5*   < > 30.0* 32.7*  MCV 84.7  --   --  85.2  PLT 307  --   --  258   < > = values in this interval not displayed.    Basic Metabolic Panel:  Recent Labs  Lab 02/01/2020 1523 02/23/2020 1523 02/15/2020 1621 01/30/2020 1715 02/02/2020 2131 2020-02-28 0347  NA 139   < > 142   < > 142 146*  K 3.4*   < > 3.4*  --   --  4.1  CL 106  --   --   --   --  116*  CO2 22  --   --   --   --  22  GLUCOSE 132*  --   --   --   --  146*  BUN 17  --   --   --   --  13  CREATININE 0.75  --   --   --   --  0.82  CALCIUM 9.0  --   --   --   --  8.2*  MG  --   --   --   --   --  2.0  PHOS  --   --   --   --   --  1.7*   < > = values in this interval not displayed.    Lipid Panel:     Component Value Date/Time   CHOL 164 Feb 28, 2020 0347   TRIG 174 (H) 02-28-20 0347   HDL 55 02/28/20 0347   CHOLHDL 3.0 02-28-2020 0347   VLDL 35 02-28-2020 0347   LDLCALC 74 02-28-20 0347   HgbA1c:  Lab Results  Component Value Date   HGBA1C 5.6 02/08/2020   Urine Drug Screen:     Component Value Date/Time   LABOPIA NONE  DETECTED 02/03/2020 1704   COCAINSCRNUR NONE DETECTED 02/08/2020 1704   LABBENZ NONE DETECTED 02/12/2020 1704   AMPHETMU NONE DETECTED 02/03/2020 1704   THCU NONE DETECTED 02/05/2020 1704   LABBARB NONE DETECTED 01/25/2020 1704    Alcohol Level     Component Value Date/Time   ETH <10 02/02/2020 1516    IMAGING  CT Code Stroke CTA Head W/WO contrast CT Code Stroke CTA Neck W/WO contrast 02/06/2020 IMPRESSION:   CTA neck:  The bilateral common carotid, internal carotid and vertebral arteries are patent within the neck without significant stenosis.   CTA head:  1. No intracranial large vessel occlusion or proximal high-grade arterial stenosis.  2. No evidence of vascular malformation on the current exam. However, consider imaging follow-up once the hematoma involutes for further assessment.  3. No contrast blush is demonstrated in the region of the acute left frontal lobe hematoma on arterial phase imaging to suggest active extravasation.  4. No intracranial aneurysm is identified.    CT HEAD WO CONTRAST 02/01/2020 IMPRESSION:  1. Unchanged size of massive intraparenchymal hematoma centered in the left frontal lobe.  2. Increased amount of blood in the occipital horn of the right lateral ventricle.  3. Unchanged rightward midline shift, measuring 5 mm.    DG Chest Port 1 View 02/13/2020 IMPRESSION:  Increasing left basilar atelectasis. Central line as described without pneumothorax.   DG Chest Portable 1 View 02/07/2020 IMPRESSION:  Tubes and lines as described above. The gastric catheter lies in the distal esophagus and should be advanced several cm further into the stomach. Increased interstitial markings likely related to the poor inspiratory effort however mild edema cannot be excluded.   DG Abd Portable 1V 02/07/2020 IMPRESSION:  Gastric catheter as described.   CT HEAD CODE STROKE WO CONTRAST 01/30/2020   IMPRESSION:  Very large acute parenchymal hemorrhage  centered within the left frontal lobe measuring 8.2 x 6.5 x 5.6 cm. Surrounding parenchymal edema. Mild intraventricular extension of hemorrhage into the left foramen of Monro, third ventricle and cerebral aqueduct. Associated mass effect with partial effacement of the left lateral ventricle. 5 mm rightward midline shift at the level of the septum pellucidum.   Transthoracic Echocardiogram  1. Left ventricular ejection fraction, by estimation, is 65 to 70%. The  left ventricle has normal function. The left ventricle has no regional  wall motion abnormalities. There is mild left ventricular hypertrophy.  Left ventricular diastolic parameters  are indeterminate.  2. Right ventricular systolic function is normal. The right ventricular  size is normal. There is moderately elevated pulmonary artery systolic  pressure.  3. The mitral valve is normal in structure. Trivial mitral valve  regurgitation.  4. Tricuspid valve regurgitation is moderate.  5. The aortic valve is tricuspid. Aortic valve regurgitation is trivial.  No aortic stenosis is present.  LTM EEG - pending   PHYSICAL EXAM  Temp:  [95.9 F (35.5 C)-100.5 F (38.1 C)] 100.3 F (37.9 C) (04/17 0800) Pulse Rate:  [55-94] 94 (04/17 1147) Resp:  [15-29] 24 (04/17 1147) BP: (88-150)/(37-82) 148/51 (04/17 1147) SpO2:  [94 %-100 %] 94 % (04/17 1147) Arterial Line BP: (116-160)/(45-59) 146/51 (04/17 1000) FiO2 (%):  [30 %-100 %] 30 % (04/17 1147) Weight:  [109.3 kg] 109.3 kg (04/16 1559)  General - Well nourished, well developed, intubated on sedation.  Ophthalmologic - fundi not visualized due to noncooperation.  Cardiovascular - Regular rate and rhythm.  Neuro - intubated on sedation, eyes closed, not following commands. With forced eye opening, eyes in mid position, not blinking to visual threat, doll's eyes present, not tracking, PERRL. Corneal reflex absent on the right but weak on the left, gag and cough present.  Breathing over the vent.  Facial symmetry not able to test due to ET tube.  Tongue protrusion not cooperative. On pain stimulation, mild withdraw on the left UE but no movement of other extremities on pain. DTR 1+ and bilateral positive babinski. Sensation, coordination and gait not tested.  ASSESSMENT/PLAN [name] is a 64 y.o. female with history of Htn, Hld and estrogen therapy presenting with left gaze and right side flaccid. Vomited - intubated - 3%.  She did not receive IV t-PA due to ICH.  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 5 mm rightward midline shift at the level of the septum pellucidum.   CT head - 01/26/2020 - Unchanged size of massive intraparenchymal hematoma centered in the left frontal lobe.  MRI head - unchanged hematoma and surrounding edema. DWI signal at left MCA and ACA consistent with acute infarcts. Scattered punctate acute infarcts in both cerebral hemispheres suggesting emboli  CTA H&N - No large vessel occlusion or high-grade arterial stenosis. No evidence of vascular malformation on the current exam.  CT Perfusion - not ordered  2D Echo - EF 65-70%   Will consider further cardioembolic work-up if patient stabilized and make neuro improvement.  [employer] Virus 2 - negative  LDL - 74  HgbA1c - 5.6  UDS - negative  VTE prophylaxis - SCDs  No antithrombotic prior to admission, now on No antithrombotic due to hemorrhage  Ongoing aggressive stroke risk factor management  Therapy recommendations:  pending  Disposition:  Pending  Acute respiratory failure  Intubated for airway protection  On sedation  CCM on board  Wean as able  Possible aspiration  Pneumonia  Nausea vomiting on presentation  Cefepime 4/16 >>  CCM on board  Temp - 100.5 -> 100.7  WBCs - 11.6->14.2  Cerebral edema  5 mm Rightward Midline Shift on CT and MRI  NSG on board, no surgery at this time  On 3% at 75 cc/hr  23.4% saline x1  Na - 139->142->146  Na every 6  Questionable seizure Activity  Bilateral versus unilateral arm shaking/shivering  LTM EEG - pending  Keppra - 500 mg Q 12 hrs after Keppra 2 g load  Seizure precautions  Hypertension  Home BP meds: Zestril 10 mg daily  Current BP meds: Cleviprex ; normodyne  Stable . SBP goal < 160 mm Hg now . Long-term BP goal normotensive  Hyperlipidemia  Home Lipid lowering medication: Pravachol 20 mg daily  LDL 74, goal < 70  Current lipid lowering medication: None for now  May continue statin at discharge  Dysphagia  N.p.o.  On tube feeding at 25 cc/h  Other Stroke Risk Factors  Advanced age  Obesity, Body mass index is 41.36 kg/m., recommend weight loss, diet and exercise as appropriate   Other Active Problems  Code status - Full code  Hypokalemia - treated - K 3.4->4.1 monitor  On estrogen daily po PTA  Hospital day # 1  This patient is critically ill due to large ICH, hemorrhagic conversion, hypertensive emergency, seizure, respiratory failure and at significant risk of neurological worsening, death form cerebral edema, brain herniation, hematoma expansion, recurrent stroke, epilepsy status. This patient's care requires constant monitoring of vital signs, hemodynamics, respiratory and cardiac monitoring, review of multiple databases, neurological assessment, discussion with family, other specialists and medical decision making of high complexity. I spent 50 minutes of neurocritical care time in the care of this patient. I had long discussion with husband and daughter at bedside, updated pt current condition, treatment plan and  potential prognosis, and answered all the  questions.  They expressed understanding and appreciation.    Rosalin Hawking, MD PhD Stroke Neurology 02/05/2020 6:23 PM   To contact Stroke Continuity provider, please refer to [email or web address] After hours, contact General Neurology

## 2020-02-10 NOTE — Progress Notes (Signed)
Initial Nutrition Assessment  RD working remotely.  DOCUMENTATION CODES:   Morbid obesity  INTERVENTION:  Recommend replacing phosphorus prior to initiation of tube feeds.  Initiate Vital High Protein at 25 mL/hr (600 mL goal daily volume) + Pro-Stat 60 mL TID per tube. Provides 1200 kcal, 143 grams of protein, 504 mL H2O daily. With current rates of propofol and Cleviprex provides a total of 3562 kcal daily.  Monitor magnesium, potassium, and phosphorus daily for at least 3 days, MD to replete as needed, as pt is at risk for refeeding syndrome.  NUTRITION DIAGNOSIS:   Inadequate oral intake related to inability to eat as evidenced by NPO status.  GOAL:   Patient will meet greater than or equal to 90% of their needs  MONITOR:   Vent status, Labs, Weight trends, TF tolerance, I & O's  REASON FOR ASSESSMENT:   Ventilator, Consult Enteral/tube feeding initiation and management  ASSESSMENT:   64 year old female with PMHx of GERD, hiatal hernia, HTN, HLD admitted with L ICH with midline shift, intubated for airway protection, also with possible seizures.   Patient is currently intubated on ventilator support MV: 12.1 L/min Temp (24hrs), Avg:97.9 F (36.6 C), Min:95.9 F (35.5 C), Max:100.5 F (38.1 C)  Propofol: 13.12 ml/hr (346 kcal daily) Cleviprex: 42 mL/hr (2016 kcal daiily)  Medications reviewed and include: Colace 100 mg BID, Novolog 0-9 units Q4hrs, pantoprazole, senna-docusate 1 tablet BID, cefepime, Cleviprex, Keppra, propofol, hypertonic saline at 75 mL/hr.  Labs reviewed: CBG 118-155, Sodium 148, Chloride 116, Phosphorus 1.7.  Enteral Access: 18 Fr. OGT  NUTRITION - FOCUSED PHYSICAL EXAM:  Unable to complete at this time as RD working remotely.  Diet Order:   Diet Order            Diet NPO time specified  Diet effective now             EDUCATION NEEDS:   No education needs have been identified at this time  Skin:  Skin Assessment: Reviewed RN  Assessment  Last BM:  Unknown/PTA  Height:   Ht Readings from Last 1 Encounters:  02/15/2020 5\' 4"  (1.626 m)   Weight:   Wt Readings from Last 1 Encounters:  02/16/2020 109.3 kg   Ideal Body Weight:  54.5 kg  BMI:  Body mass index is 41.36 kg/m.  Estimated Nutritional Needs:   Kcal:  02/11/20  Protein:  136 grams  Fluid:  1.6-1.9 L/day  [phone], MS, RD, LDN Pager number available on Amion

## 2020-02-10 NOTE — Progress Notes (Signed)
Back from the OR, s/p hemicraniectomy.   Most recent Na increased to 157. As it is approaching 160, will decrease the 3% saline infusion rate to 50 cc/hr.   EEG leads were taken off for surgery. Dr. Alene Mires preliminary EEG read earlier today as documented in the chart describes no seizures. EEG leads can be replaced in the AM.   Electronically signed: Dr. Caryl Pina

## 2020-02-11 ENCOUNTER — Inpatient Hospital Stay (HOSPITAL_COMMUNITY): Payer: Medicare Other

## 2020-02-11 DIAGNOSIS — Z9889 Other specified postprocedural states: Secondary | ICD-10-CM

## 2020-02-11 DIAGNOSIS — I619 Nontraumatic intracerebral hemorrhage, unspecified: Secondary | ICD-10-CM | POA: Diagnosis not present

## 2020-02-11 DIAGNOSIS — R9401 Abnormal electroencephalogram [EEG]: Secondary | ICD-10-CM

## 2020-02-11 DIAGNOSIS — J9601 Acute respiratory failure with hypoxia: Secondary | ICD-10-CM | POA: Diagnosis not present

## 2020-02-11 DIAGNOSIS — D62 Acute posthemorrhagic anemia: Secondary | ICD-10-CM | POA: Diagnosis not present

## 2020-02-11 DIAGNOSIS — I10 Essential (primary) hypertension: Secondary | ICD-10-CM | POA: Diagnosis not present

## 2020-02-11 DIAGNOSIS — Z978 Presence of other specified devices: Secondary | ICD-10-CM | POA: Diagnosis not present

## 2020-02-11 DIAGNOSIS — I6340 Cerebral infarction due to embolism of unspecified cerebral artery: Secondary | ICD-10-CM | POA: Diagnosis not present

## 2020-02-11 LAB — BASIC METABOLIC PANEL
BUN: 20 mg/dL (ref 8–23)
CO2: 21 mmol/L — ABNORMAL LOW (ref 22–32)
Calcium: 7.8 mg/dL — ABNORMAL LOW (ref 8.9–10.3)
Chloride: >130 mmol/L (ref 98–111)
Creatinine, Ser: 0.81 mg/dL (ref 0.44–1.00)
GFR calc Af Amer: >60 mL/min (ref >60)
GFR calc non Af Amer: >60 mL/min (ref >60)
Glucose, Bld: 118 mg/dL — ABNORMAL HIGH (ref 70–99)
Potassium: 3.5 mmol/L (ref 3.5–5.1)
Sodium: 157 mmol/L — ABNORMAL HIGH (ref 135–145)

## 2020-02-11 LAB — GLUCOSE, CAPILLARY
Glucose-Capillary: 108 mg/dL — ABNORMAL HIGH (ref 70–99)
Glucose-Capillary: 120 mg/dL — ABNORMAL HIGH (ref 70–99)
Glucose-Capillary: 124 mg/dL — ABNORMAL HIGH (ref 70–99)
Glucose-Capillary: 127 mg/dL — ABNORMAL HIGH (ref 70–99)
Glucose-Capillary: 130 mg/dL — ABNORMAL HIGH (ref 70–99)
Glucose-Capillary: 145 mg/dL — ABNORMAL HIGH (ref 70–99)

## 2020-02-11 LAB — PHOSPHORUS: Phosphorus: 2.2 mg/dL — ABNORMAL LOW (ref 2.5–4.6)

## 2020-02-11 LAB — CBC
HCT: 29.2 % — ABNORMAL LOW (ref 36.0–46.0)
Hemoglobin: 8.6 g/dL — ABNORMAL LOW (ref 12.0–15.0)
MCH: 25.7 pg — ABNORMAL LOW (ref 26.0–34.0)
MCHC: 29.5 g/dL — ABNORMAL LOW (ref 30.0–36.0)
MCV: 87.2 fL (ref 80.0–100.0)
Platelets: 235 10*3/uL (ref 150–400)
RBC: 3.35 MIL/uL — ABNORMAL LOW (ref 3.87–5.11)
RDW: 15.3 % (ref 11.5–15.5)
WBC: 14.7 10*3/uL — ABNORMAL HIGH (ref 4.0–10.5)
nRBC: 0 % (ref 0.0–0.2)

## 2020-02-11 LAB — TRIGLYCERIDES: Triglycerides: 66 mg/dL (ref <150)

## 2020-02-11 LAB — SODIUM
Sodium: 159 mmol/L — ABNORMAL HIGH (ref 135–145)
Sodium: 161 mmol/L (ref 135–145)
Sodium: 161 mmol/L (ref 135–145)

## 2020-02-11 LAB — MAGNESIUM: Magnesium: 2.1 mg/dL (ref 1.7–2.4)

## 2020-02-11 MED ORDER — SODIUM CHLORIDE 0.45 % IV SOLN: INTRAVENOUS | Status: DC

## 2020-02-11 MED ORDER — ALBUTEROL SULFATE (2.5 MG/3ML) 0.083% IN NEBU
2.5000 mg | INHALATION_SOLUTION | Freq: Four times a day (QID) | RESPIRATORY_TRACT | Status: DC
  Administered 2020-02-11 (×3): 2.5 mg via RESPIRATORY_TRACT
  Filled 2020-02-11 (×3): qty 3

## 2020-02-11 MED ORDER — AMLODIPINE BESYLATE 10 MG PO TABS: 10.0000 mg | ORAL_TABLET | Freq: Every day | ORAL | Status: DC

## 2020-02-11 MED ORDER — AMLODIPINE BESYLATE 10 MG PO TABS
10.0000 mg | ORAL_TABLET | Freq: Every day | ORAL | Status: DC
  Administered 2020-02-11 – 2020-02-12 (×2): 10 mg
  Filled 2020-02-11: qty 1

## 2020-02-11 MED ORDER — LISINOPRIL 20 MG PO TABS
20.0000 mg | ORAL_TABLET | Freq: Every day | ORAL | Status: DC
  Filled 2020-02-11: qty 1

## 2020-02-11 MED ORDER — AMLODIPINE BESYLATE 10 MG PO TABS
10.0000 mg | ORAL_TABLET | Freq: Every day | ORAL | Status: DC
  Filled 2020-02-11: qty 1

## 2020-02-11 MED ORDER — LISINOPRIL 20 MG PO TABS
20.0000 mg | ORAL_TABLET | Freq: Every day | ORAL | Status: DC
  Administered 2020-02-11 – 2020-02-12 (×2): 20 mg
  Filled 2020-02-11: qty 1

## 2020-02-11 MED ORDER — LISINOPRIL 20 MG PO TABS: 20.0000 mg | ORAL_TABLET | Freq: Every day | ORAL | Status: DC

## 2020-02-11 MED ORDER — SODIUM CHLORIDE 0.9 % IV SOLN: INTRAVENOUS | Status: DC

## 2020-02-11 MED ORDER — LISINOPRIL 10 MG PO TABS: 10.0000 mg | ORAL_TABLET | Freq: Every day | ORAL | Status: DC

## 2020-02-11 MED ORDER — FENTANYL 2500MCG IN NS 250ML (10MCG/ML) PREMIX INFUSION
25.0000 ug/h | INTRAVENOUS | Status: DC
  Administered 2020-02-11: 50 ug/h via INTRAVENOUS
  Administered 2020-02-12: 75 ug/h via INTRAVENOUS
  Administered 2020-02-13: 150 ug/h via INTRAVENOUS
  Administered 2020-02-15: 100 ug/h via INTRAVENOUS
  Administered 2020-02-16: 25 ug/h via INTRAVENOUS
  Filled 2020-02-11 (×5): qty 250

## 2020-02-11 NOTE — Progress Notes (Addendum)
NAME:  Meredith Cooper, MRN:  [id number], DOB:  November 04, 1955, LOS: 2 ADMISSION DATE:  02/07/2020, CONSULTATION DATE:  02/12/2020 REFERRING MD:  Leonie Man - neuro, CHIEF COMPLAINT:  Code stroke - L gaze deviation, R sided weakness   Brief History   31 yowf never smoker  presented to [facility] ED 4/16 as code stroke with sudden onset R sided weakness, L gaze deviation. Rapid deterioration in ED; patient vomited multiple times in CT, then required intubation for airway protection. CT H with large acute parenchymal hemorrhage in L frontal lobe, 8.2 x 6.5 x 5.6 cm, 49m midline shift.   Past Medical History  HTN HLD Hiatal hernia GERD S/p hysterectomy   Significant Hospital Events   4/16 Code stroke, large ICH, Intubated.  4/17 overnight possible seizure activity, received ativan and started Keppra. Max on cleviprex, added PRN hydralazine for SBP goal <140 4/17  no sz 4/17 pm craniotomy for hematoma evacuation   Consults:  [id number]  Stroke team   Procedures:  4/16 ETT >  4/16 CVC>> 4/16 Arterial line>>  4/17 Craniotomy/ no drain  Significant Diagnostic Tests:  4/16 CT H> Large acute intraparenchymal hemorrhage centered in L frontal lobe 8.2 x 6.5 x 5.6 cm, 554mmidline shift.  4/16 CTA H> no aneurysm  4/17 CT H> unchanged size of massive IPH centered in L frontal lobe. Interval increase in amount of blood in R lateral ventricle occipital horn. 41m71midline shift  4/17 MRI Brain >>  1. Unchanged large left frontoparietal parenchymal hematoma with unchanged rightward midline shift. Mild surrounding edema and restricted diffusion consistent with associated acute infarct. 2. Small volume intraventricular and subarachnoid hemorrhage. 3. Scattered punctate acute infarcts in both cerebral hemispheres suggesting emboli. - echo 4/17 with mod PAS elevation/ mild LVH  Micro Data:  4/16 SARS Cov2   neg  4/16  MRSA PCR  neg  4/8 ET (on abx) >>>   Antimicrobials:  4/16 cefepime>> added for possible  aspiration event  4/17 Vanc  X one dose in OR   Scheduled Meds: .  stroke: mapping our early stages of recovery book   Does not apply Once  . sodium chloride   Intravenous Once  . chlorhexidine gluconate (MEDLINE KIT)  15 mL Mouth Rinse BID  . Chlorhexidine Gluconate Cloth  6 each Topical Daily  . docusate  100 mg Per Tube BID  . feeding supplement (PRO-STAT SUGAR FREE 64)  60 mL Per Tube TID  . feeding supplement (VITAL HIGH PROTEIN)  1,000 mL Per Tube Q24H  . insulin aspart  0-9 Units Subcutaneous Q4H  . levothyroxine  112 mcg Per Tube Q0600  . mouth rinse  15 mL Mouth Rinse 10 times per day  . pantoprazole sodium  40 mg Per Tube Daily  . senna-docusate  1 tablet Per Tube BID  . sodium chloride flush  10-40 mL Intracatheter Q12H   Continuous Infusions: . ceFEPime (MAXIPIME) IV Stopped (02/11/20 0536)  . clevidipine 8 mg/hr (02/11/20 0800)  . labetalol (NORMODYNE) infusion Stopped (01/28/2020 1359)  . levETIRAcetam Stopped (02/11/20 0028)  . propofol (DIPRIVAN) infusion 20 mcg/kg/min (02/11/20 0800)  . sodium chloride (hypertonic) 50 mL/hr at 02/11/20 0800   PRN Meds:.acetaminophen **OR** acetaminophen (TYLENOL) oral liquid 160 mg/5 mL **OR** acetaminophen, fentaNYL (SUBLIMAZE) injection, midazolam, sodium chloride flush  Interim history/subjective:   u;nderwent craniotomy pm 4/17 for hematoma evacuation, no drain placed   Objective   Blood pressure (!) 142/58, pulse 92, temperature 100.2 F (37.9 C), temperature source Oral,  resp. rate (!) 29, height _0  (1.626 m), weight 109.3 kg, SpO2 91 %.    Vent Mode: PRVC FiO2 (%):  [30 %] 30 % Set Rate:  [20 bmp] 20 bmp Vt Set:  [440 mL] 440 mL PEEP:  [5 cmH20] 5 cmH20 Plateau Pressure:  [10 cmH20-18 cmH20] 13 cmH20   Intake/Output Summary (Last 24 hours) at 02/11/2020 0839 Last data filed at 02/11/2020 0800 Gross per 24 hour  Intake 3336.26 ml  Output 1395 ml  Net 1941.26 ml   Filed Weights   02/05/2020 1500 01/27/2020 1559    Weight: 109.3 kg 109.3 kg    Examination:  Tmax  100.7  (no change in peaks on T curve) Pt sedated on diprovan,  No jvd Oropharynx et /og Lungs clear  Bilaterally though air trapping on graphic flow time loop RRR no s3 or or sign murmur Abd obese with nl  Excursion/ tol tf  Extr warm with trace bilateral ankle pitting edema / PAS in place    I personally reviewed images and  impression as follows:  CXR:   4/18  tubes/lines ok ? Air bronchograms LLL     Resolved Hospital Problem list     Assessment & Plan:   Acute respiratory failure with failure to protect airway, likely aspiration event, in setting of intraparenchymal hemorrhage  -intubated in ED with failed airway protection after vomiting P -Continue MV support and titrate 02 to keep sats > 95% / I time 0.8 to allow full exp / add albuterol for air trapping though note this is a never smoker not copd likely   -Pulm hygiene  - see micro flowsheet above for abx, no changes for now   Large ICH with cytotoxic cerebral edema  Possible seizure activity  -s/p craniotomy pm 4/17 s drain P -Neuro managing -SBP <140 until 4/17 1600, then goal SBP < 160. Labetalol gtt added -3% saline, goal Na 150-155. q6hr Na checks  -SCD only   HTN - echo 4/17 with mod PAS elevation/ mild LVH P -ICU monitoring -cleviprex gtt + labetalol gtt prn -continue arterial line  Hypokalemia, improved Hypophosphatemia  - Phos 3.2/ mg 2.1 am 4/17  P - Trend BMP mag phos, replace as needed   Leukocytosis with possible HCAP/ LLL   P -trend CBC, fever curve  -continue abx per flow sheet above  check sputum 4/18 (abx already on board though)   Hyperglycemia P -sensitive SSI   Mild acute blood loss anemia  -post craniotomy 4/17  >>> theshold to transfuse at < 7 gm hgb  At risk malnutrition P -Advance OGT per CXR read -Initiate EN per RDN    Best practice:  Diet: EN per RDN  Pain/Anxiety/Delirium protocol (if indicated): RASS  goal 0, PRN fentanyl PRN versed Propofol gtt available  VAP protocol (if indicated): yes DVT prophylaxis: SCD only  GI prophylaxis: protonix  Glucose control: SSI Mobility: BR Code Status: Full  Family Communication: Per Primary Disposition: ICU   Labs   CBC: Recent Labs  Lab 02/04/2020 1522 02/12/2020 1523 02/14/2020 1621 02/03/2020 0347 02/11/20 0202  WBC  --  11.6*  --  14.2* 14.7*  NEUTROABS  --  5.8  --   --   --   HGB 11.2* 10.8* 10.2* 10.0* 8.6*  HCT 33.0* 35.5* 30.0* 32.7* 29.2*  MCV  --  84.7  --  85.2 87.2  PLT  --  307  --  258 109    Basic Metabolic Panel: Recent Labs  Lab 02/11/2020 1522 02/19/2020 1522 02/19/2020 1523 02/05/2020 1523 02/05/2020 1621 01/25/2020 1715 01/25/2020 0347 02/23/2020 0924 02/05/2020 1417 02/12/2020 1952 02/11/20 0202  NA 140   < > 139   < > 142   < > 146* 148* 154* 157* 157*  K 3.4*  --  3.4*  --  3.4*  --  4.1  --   --   --  3.5  CL 106  --  106  --   --   --  116*  --   --   --  >130*  CO2  --   --  22  --   --   --  22  --   --   --  21*  GLUCOSE 129*  --  132*  --   --   --  146*  --   --   --  118*  BUN 19  --  17  --   --   --  13  --   --   --  20  CREATININE 0.70  --  0.75  --   --   --  0.82  --   --   --  0.81  CALCIUM  --   --  9.0  --   --   --  8.2*  --   --   --  7.8*  MG  --   --   --   --   --   --  2.0  --   --   --  2.1  PHOS  --   --   --   --   --   --  1.7*  --   --   --  2.2*   < > = values in this interval not displayed.   GFR: Estimated Creatinine Clearance: 82.5 mL/min (by C-G formula based on SCr of 0.81 mg/dL). Recent Labs  Lab 02/07/2020 1523 02/16/2020 0347 02/11/20 0202  WBC 11.6* 14.2* 14.7*    Liver Function Tests: Recent Labs  Lab 02/07/2020 1523  AST 25  ALT 19  ALKPHOS 69  BILITOT 0.7  PROT 6.7  ALBUMIN 3.4*   No results for input(s): LIPASE, AMYLASE in the last 168 hours. No results for input(s): AMMONIA in the last 168 hours.  ABG    Component Value Date/Time   PHART 7.394 02/22/2020 1621    PCO2ART 43.5 02/17/2020 1621   PO2ART 423.0 (H) 02/18/2020 1621   HCO3 26.6 02/18/2020 1621   TCO2 28 02/02/2020 1621   O2SAT 100.0 02/19/2020 1621     Coagulation Profile: Recent Labs  Lab 01/30/2020 1523  INR 0.9    Cardiac Enzymes: No results for input(s): CKTOTAL, CKMB, CKMBINDEX, TROPONINI in the last 168 hours.  HbA1C: Hgb A1c MFr Bld  Date/Time Value Ref Range Status  02/02/2020 06:30 PM 5.6 4.8 - 5.6 % Final    Comment:    (NOTE) Pre diabetes:          5.7%-6.4% Diabetes:              >6.4% Glycemic control for   <7.0% adults with diabetes     CBG: Recent Labs  Lab 02/08/2020 1626 02/18/2020 1942 02/11/2020 2327 02/11/20 0339 02/11/20 0815  GLUCAP 130* 166* 119* 108* 120*     The patient is critically ill with multiple organ systems failure and requires high complexity decision making for assessment and support, frequent evaluation and titration of therapies, application of advanced monitoring technologies  and extensive interpretation of multiple databases. Critical Care Time devoted to patient care services described in this note is 45 minutes.    Christinia Gully, MD Pulmonary and [place] [phone] After 6:00 PM or weekends, use [facility] [phone]  After 7:00 pm call Elink  [phone]

## 2020-02-11 NOTE — Progress Notes (Signed)
LTM was disconnected for surgery. Per Dr Roda Shutters it will need to be put back on to resume study.

## 2020-02-11 NOTE — Progress Notes (Signed)
CRITICAL VALUE ALERT  Critical Value:  Na+ 161  Date & Time Notied:  4/18 1540  Provider Notified: Marvel Plan, MD  Orders Received/Actions taken: change NS @ 30mL/h to 1/2NS @ 56mL/hr

## 2020-02-11 NOTE — Progress Notes (Signed)
STROKE TEAM PROGRESS NOTE   INTERVAL HISTORY Her husband and RN are at the bedside.  Pt went for hematoma evacuation last night with Dr. Conchita Paris. Neuro no significant change today but seems more responsive with decreasing propofol sedation. Spontaneous moving left UE bicep against gravity but no spontaneous movement of other limbs. Still has left gaze. Na 159 and will switch to NS.    OBJECTIVE Vitals:   02/11/20 0600 02/11/20 0630 02/11/20 0700 02/11/20 0731  BP: (!) 138/54 (!) 148/57 (!) 155/63   Pulse: 81 81 82   Resp: (!) 24 (!) 25 (!) 26   Temp:      TempSrc:      SpO2: 96% 97% 96% 96%  Weight:      Height:        CBC:  Recent Labs  Lab 2020-03-01 1523 03/01/20 1621 01/26/2020 0347 02/11/20 0202  WBC 11.6*   < > 14.2* 14.7*  NEUTROABS 5.8  --   --   --   HGB 10.8*   < > 10.0* 8.6*  HCT 35.5*   < > 32.7* 29.2*  MCV 84.7   < > 85.2 87.2  PLT 307   < > 258 235   < > = values in this interval not displayed.    Basic Metabolic Panel:  Recent Labs  Lab 02/01/2020 0347 02/08/2020 0924 01/29/2020 1952 02/11/20 0202  NA 146*   < > 157* 157*  K 4.1  --   --  3.5  CL 116*  --   --  >130*  CO2 22  --   --  21*  GLUCOSE 146*  --   --  118*  BUN 13  --   --  20  CREATININE 0.82  --   --  0.81  CALCIUM 8.2*  --   --  7.8*  MG 2.0  --   --  2.1  PHOS 1.7*  --   --  2.2*   < > = values in this interval not displayed.    Lipid Panel:     Component Value Date/Time   CHOL 164 02/21/2020 0347   TRIG 66 02/11/2020 0202   HDL 55 02/01/2020 0347   CHOLHDL 3.0 01/31/2020 0347   VLDL 35 02/13/2020 0347   LDLCALC 74 02/13/2020 0347   HgbA1c:  Lab Results  Component Value Date   HGBA1C 5.6 01-Mar-2020   Urine Drug Screen:     Component Value Date/Time   LABOPIA NONE DETECTED 2020/03/01 1704   COCAINSCRNUR NONE DETECTED 01-Mar-2020 1704   LABBENZ NONE DETECTED 2020-03-01 1704   AMPHETMU NONE DETECTED 2020-03-01 1704   THCU NONE DETECTED Mar 01, 2020 1704   LABBARB NONE  DETECTED 2020/03/01 1704    Alcohol Level     Component Value Date/Time   ETH <10 Mar 01, 2020 1516    IMAGING  CT Code Stroke CTA Head W/WO contrast CT Code Stroke CTA Neck W/WO contrast 03-01-20 IMPRESSION:   CTA neck:  The bilateral common carotid, internal carotid and vertebral arteries are patent within the neck without significant stenosis.   CTA head:  1. No intracranial large vessel occlusion or proximal high-grade arterial stenosis.  2. No evidence of vascular malformation on the current exam. However, consider imaging follow-up once the hematoma involutes for further assessment.  3. No contrast blush is demonstrated in the region of the acute left frontal lobe hematoma on arterial phase imaging to suggest active extravasation.  4. No intracranial aneurysm is identified.  CT HEAD WO CONTRAST 02/18/2020 IMPRESSION:  1. Unchanged size of massive intraparenchymal hematoma centered in the left frontal lobe.  2. Increased amount of blood in the occipital horn of the right lateral ventricle.  3. Unchanged rightward midline shift, measuring 5 mm.    DG Chest Port 1 View 02/12/2020 IMPRESSION:  Increasing left basilar atelectasis. Central line as described without pneumothorax.   DG Chest Portable 1 View 01/29/2020 IMPRESSION:  Tubes and lines as described above. The gastric catheter lies in the distal esophagus and should be advanced several cm further into the stomach. Increased interstitial markings likely related to the poor inspiratory effort however mild edema cannot be excluded.   DG Chest Portable 1 View - pending 02/11/20    DG Abd Portable 1V 01/25/2020 IMPRESSION:  Gastric catheter as described.   CT HEAD CODE STROKE WO CONTRAST 02/14/2020   IMPRESSION:  Very large acute parenchymal hemorrhage centered within the left frontal lobe measuring 8.2 x 6.5 x 5.6 cm. Surrounding parenchymal edema. Mild intraventricular extension of hemorrhage into the left  foramen of [place], third ventricle and cerebral aqueduct. Associated mass effect with partial effacement of the left lateral ventricle. 5 mm rightward midline shift at the level of the septum pellucidum.   Transthoracic Echocardiogram  1. Left ventricular ejection fraction, by estimation, is 65 to 70%. The  left ventricle has normal function. The left ventricle has no regional  wall motion abnormalities. There is mild left ventricular hypertrophy.  Left ventricular diastolic parameters  are indeterminate.  2. Right ventricular systolic function is normal. The right ventricular  size is normal. There is moderately elevated pulmonary artery systolic  pressure.  3. The mitral valve is normal in structure. Trivial mitral valve  regurgitation.  4. Tricuspid valve regurgitation is moderate.  5. The aortic valve is tricuspid. Aortic valve regurgitation is trivial.  No aortic stenosis is present.  LTM EEG  cortical dysfunction in left hemisphere likely secondary to underlying hemorrhage. Additionally, there is evidence of severe diffuse encephalopathy, non specific to etiology. No seizures or definite epileptiform discharges were seen throughout the recording. Event button was pressed on 02/19/2020 as described above without concomitant eeg change and was likely not epileptic.   PHYSICAL EXAM  Temp:  [98.3 F (36.8 C)-100.7 F (38.2 C)] 100.2 F (37.9 C) (04/18 0400) Pulse Rate:  [66-94] 82 (04/18 0700) Resp:  [19-29] 26 (04/18 0700) BP: (88-168)/(37-82) 155/63 (04/18 0700) SpO2:  [93 %-100 %] 96 % (04/18 0731) Arterial Line BP: (114-196)/(44-69) 191/64 (04/18 0700) FiO2 (%):  [30 %] 30 % (04/18 0731)  General - Well nourished, well developed, intubated on sedation.  Ophthalmologic - fundi not visualized due to noncooperation.  Cardiovascular - Regular rate and rhythm.  Neuro - intubated on sedation with propofol, eyes closed, not following commands. With painful stimulation, pt  open eyes, eyes in left gaze position, not blinking to visual threat, doll's eyes present, not tracking, PERRL. Corneal reflex weak on the right but present on the left, gag and cough present. Breathing over the vent.  Facial symmetry not able to test due to ET tube.  Tongue protrusion not cooperative. On pain stimulation, mild withdraw on the left UE bicep against gravity and slight withdraw on other limbs. DTR 1+ and bilateral positive babinski. Sensation, coordination and gait not tested.   ASSESSMENT/PLAN [name] is a 64 y.o. female with history of Htn, Hld and estrogen therapy presenting with left gaze and right side flaccid. Vomited and was intubated  She did not receive IV t-PA due to [place].  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 5 mm rightward midline shift at the level of the septum pellucidum.   CT head - 02/16/2020 - Unchanged size of massive intraparenchymal hematoma centered in the left frontal lobe.  MRI head - unchanged hematoma and surrounding edema. DWI signal at left MCA and ACA consistent with acute infarcts. Scattered punctate acute infarcts in both cerebral hemispheres suggesting emboli  CTA H&N - No large vessel occlusion or high-grade arterial stenosis. No evidence of vascular malformation on the current exam.  CT repeat in am  2D Echo - EF 65-70%   Will consider further cardioembolic work-up if patient stabilized and make neuro improvement.  [employer] Virus 2 - negative  LDL - 74  HgbA1c - 5.6  UDS - negative  VTE prophylaxis - SCDs  No antithrombotic prior to admission, now on No antithrombotic due to hemorrhage  Ongoing aggressive stroke risk factor  management  Therapy recommendations:  pending  Disposition:  Pending  Acute respiratory failure  Intubated for airway protection  On sedation  CCM on board  Wean as able  Possible aspiration Pneumonia  Nausea vomiting on presentation  Cefepime 4/16 >>  CCM on board  Temp - 100.5 -> 100.7->101.6  WBCs - 11.6->14.2->14.7  DG Chest Port 1 View - pending  Cerebral edema  5 mm Rightward Midline Shift on CT and MRI  NSG on board s/p hematoma evacuation 4/17  On 3% at 75 cc/hr -> 50 cc -> NS @ 50  23.4% saline x1  Na - 139->142->146->157->159  Na every 6h  UE shivering/shaking  Bilateral versus unilateral arm shaking/shivering  LTM EEG no seizure to correlate the shaking  Keppra - 500 mg Q 12 hrs after Keppra 2 g load  Seizure precautions  Hypertension  Home BP meds: Zestril 10 mg daily  Current BP meds: Cleviprex ; normodyne (infusion)  Add lisinopril 20 and norvasc 10  Wean off cleviprex as able  Stable  . SBP goal < 160 mm Hg  . Long-term BP goal normotensive  Hyperlipidemia  Home Lipid lowering medication: Pravachol 20 mg daily  LDL 74, goal < 70  Current lipid lowering medication: None for now  May continue to resume statin at discharge vs. SATURN trial  Dysphagia  N.p.o.  On tube feeding at 25 cc/h  Other Stroke Risk Factors  Advanced age  Obesity, Body mass index is 41.36 kg/m., recommend weight loss, diet and exercise as appropriate   Other Active Problems  Code status - Full code  Hypokalemia - treated - K 3.4->4.1->3.5 monitor  On estrogen daily po PTA  Anemia - Hb - 10.8->10.0->8.6 (post op and PRBC) monitor  Hospital day # 2  This patient is critically ill due to large ICH, hemorrhagic conversion, hypertensive emergency, seizure, respiratory failure and at significant risk of neurological worsening, death form cerebral edema, brain herniation, hematoma expansion, recurrent stroke, epilepsy status. This  patient's care requires constant monitoring of vital signs, hemodynamics, respiratory and cardiac monitoring, review of multiple databases, neurological assessment, discussion with family, other specialists and medical decision making of high complexity. I spent 40 minutes of neurocritical care time in the care of this patient.  I had long discussion with husband at bedside, updated pt current condition, treatment plan and potential prognosis, and answered all the questions.  He expressed understanding and appreciation.   Marvel Plan, MD PhD Stroke Neurology 02/11/2020 10:09 AM   To contact Stroke Continuity provider, please refer to [email or web address]. After hours, contact General Neurology

## 2020-02-11 NOTE — Progress Notes (Signed)
Serum Na at 161, unchanged from last blood draw. On IV NS at 50 cc/hr with other fluids/meds adding another 40 cc/hr for a total IVF of 90 cc/hr. Increasing her IV NS to 85 cc/hr.   Electronically signed: Dr. Caryl Pina

## 2020-02-11 NOTE — Progress Notes (Addendum)
Discussed with Nephrology the elevated Cl of > 130 in the context of Na level of 157 which is in target range due to hypertonic saline. Bicarb level is 21. Advised that this is not an issue in the context of the hypertonic saline.   Hemoglobin is down to 8.6, which is most likely due to the surgery she underwent late last night.   Continue to monitor.   Electronically signed: Dr. Caryl Pina

## 2020-02-11 NOTE — Progress Notes (Signed)
LTM EEG hooked up and running - no initial skin breakdown - push button tested - neuro notified. New leads used. [facility] monitoring pt

## 2020-02-11 NOTE — Progress Notes (Signed)
CRITICAL VALUE ALERT  Critical Value:  Na of 161  Date & Time Notied:  4/18 2140  Provider Notified: Otelia Limes  Orders Received/Actions taken: fluid order changed

## 2020-02-11 NOTE — Progress Notes (Signed)
  NEUROSURGERY PROGRESS NOTE   No issues overnight. Remains intubated, sedated  EXAM:  BP (!) 142/58   Pulse 92   Temp 100.2 F (37.9 C) (Oral)   Resp (!) 29   Ht 5\' 4"  (1.626 m)   Wt 109.3 kg   SpO2 91%   BMI 41.36 kg/m   On propofol: No eye opening Breathing over vent Pupils 80mm, reactive OD, sluggish OS No motor responses Wound dressing c/d/i  IMPRESSION:  64 y.o. female with spontaneous large left frontoparietal IPH POD#1 s/p hematoma evacuation.  PLAN: - Cont supportive care per neurology

## 2020-02-11 NOTE — Procedures (Addendum)
Patient Name: Meredith Cooper  MRN: [id number]  Epilepsy Attending: Charlsie Quest  Referring Physician/Provider: Dr Caryl Pina Duration: 02/11/2020 1232 to 02/11/2020 1931 and 02/11/2020 1000 to 02/12/2020 0700  Patient history:  64 y.o. female who presented to ED as a code stroke for left gaze and right side flaccid. EEG to evaluate for seizure.  Level of alertness: comatose  AEDs during EEG study: propofol, keppra  Technical aspects: This EEG study was done with scalp electrodes positioned according to the 10-20 International system of electrode placement. Electrical activity was acquired at a sampling rate of 500Hz  and reviewed with a high frequency filter of 70Hz  and a low frequency filter of 1Hz . EEG data were recorded continuously and digitally stored.   DESCRIPTION: EEG showed continuous generalized and lateralized left hemisphere polymorphic 3-6hz  sharply contoured theta-delta slowing which at times appears sharply contoured. Hyperventilation and photic stimulation were not performed.  Event button was pressed on 02/17/2020 at 1558, 02/11/2020 at 1440 and 1624. Patient was noted to have subtle tremor like movements of left arm. Concomitant eeg before, during and after the event didn't show any eeg change to suggest seizure.   ABNORMALITY -  Continuous slow, generalized and lateralized left hemisphere  IMPRESSION: This study is suggestive of cortical dysfunction in left hemisphere likely secondary to underlying hemorrhage. Additionally, there is evidence of severe diffuse encephalopathy, non specific to etiology. No seizures or definite epileptiform discharges were seen throughout the recording.  Event button was pressed on 02/16/2020 and 02/11/2020 as described above without concomitant eeg change and was likely not epileptic.  Carney Saxton 02/13/2020

## 2020-02-11 NOTE — Progress Notes (Signed)
Patient's daughter called for update. During call she mentioned that patient sometimes wears 1 contact lens. Upon assessment, lens was noted in right eye and removed.

## 2020-02-11 NOTE — Progress Notes (Signed)
PT Cancellation Note  Patient Details Name: Meredith Cooper MRN: [id number] DOB: Jul 29, 1956   Cancelled Treatment:    Reason Eval/Treat Not Completed: Patient not medically ready  Now s/p surgery. Noted RASS -4. Will continue to monitor for appropriateness to proceed with PT evaluation.   Jerolyn Center, PT Pager [phone]   Zena Amos 02/11/2020, 8:35 AM

## 2020-02-12 ENCOUNTER — Inpatient Hospital Stay (HOSPITAL_COMMUNITY): Payer: Medicare Other

## 2020-02-12 DIAGNOSIS — Z978 Presence of other specified devices: Secondary | ICD-10-CM | POA: Diagnosis not present

## 2020-02-12 DIAGNOSIS — D62 Acute posthemorrhagic anemia: Secondary | ICD-10-CM | POA: Diagnosis not present

## 2020-02-12 DIAGNOSIS — J9601 Acute respiratory failure with hypoxia: Secondary | ICD-10-CM | POA: Diagnosis not present

## 2020-02-12 DIAGNOSIS — E876 Hypokalemia: Secondary | ICD-10-CM

## 2020-02-12 DIAGNOSIS — I619 Nontraumatic intracerebral hemorrhage, unspecified: Secondary | ICD-10-CM | POA: Diagnosis not present

## 2020-02-12 DIAGNOSIS — I6340 Cerebral infarction due to embolism of unspecified cerebral artery: Secondary | ICD-10-CM | POA: Diagnosis not present

## 2020-02-12 LAB — BASIC METABOLIC PANEL
Anion gap: 9 (ref 5–15)
Anion gap: 10 (ref 5–15)
BUN: 29 mg/dL — ABNORMAL HIGH (ref 8–23)
BUN: 29 mg/dL — ABNORMAL HIGH (ref 8–23)
CO2: 24 mmol/L (ref 22–32)
CO2: 23 mmol/L (ref 22–32)
Calcium: 8.5 mg/dL — ABNORMAL LOW (ref 8.9–10.3)
Calcium: 8.6 mg/dL — ABNORMAL LOW (ref 8.9–10.3)
Chloride: 127 mmol/L — ABNORMAL HIGH (ref 98–111)
Chloride: 126 mmol/L — ABNORMAL HIGH (ref 98–111)
Creatinine, Ser: 0.72 mg/dL (ref 0.44–1.00)
Creatinine, Ser: 0.72 mg/dL (ref 0.44–1.00)
GFR calc Af Amer: >60 mL/min (ref >60)
GFR calc Af Amer: >60 mL/min (ref >60)
GFR calc non Af Amer: >60 mL/min (ref >60)
GFR calc non Af Amer: >60 mL/min (ref >60)
Glucose, Bld: 153 mg/dL — ABNORMAL HIGH (ref 70–99)
Glucose, Bld: 130 mg/dL — ABNORMAL HIGH (ref 70–99)
Potassium: 3 mmol/L — ABNORMAL LOW (ref 3.5–5.1)
Potassium: 3.1 mmol/L — ABNORMAL LOW (ref 3.5–5.1)
Sodium: 160 mmol/L — ABNORMAL HIGH (ref 135–145)
Sodium: 159 mmol/L — ABNORMAL HIGH (ref 135–145)

## 2020-02-12 LAB — GLUCOSE, CAPILLARY
Glucose-Capillary: 120 mg/dL — ABNORMAL HIGH (ref 70–99)
Glucose-Capillary: 122 mg/dL — ABNORMAL HIGH (ref 70–99)
Glucose-Capillary: 133 mg/dL — ABNORMAL HIGH (ref 70–99)
Glucose-Capillary: 138 mg/dL — ABNORMAL HIGH (ref 70–99)
Glucose-Capillary: 142 mg/dL — ABNORMAL HIGH (ref 70–99)
Glucose-Capillary: 164 mg/dL — ABNORMAL HIGH (ref 70–99)

## 2020-02-12 LAB — PHOSPHORUS: Phosphorus: 1.8 mg/dL — ABNORMAL LOW (ref 2.5–4.6)

## 2020-02-12 LAB — SODIUM
Sodium: 161 mmol/L (ref 135–145)
Sodium: 158 mmol/L — ABNORMAL HIGH (ref 135–145)
Sodium: 159 mmol/L — ABNORMAL HIGH (ref 135–145)

## 2020-02-12 LAB — CBC
HCT: 27.9 % — ABNORMAL LOW (ref 36.0–46.0)
Hemoglobin: 8 g/dL — ABNORMAL LOW (ref 12.0–15.0)
MCH: 25.6 pg — ABNORMAL LOW (ref 26.0–34.0)
MCHC: 28.7 g/dL — ABNORMAL LOW (ref 30.0–36.0)
MCV: 89.4 fL (ref 80.0–100.0)
Platelets: 213 10*3/uL (ref 150–400)
RBC: 3.12 MIL/uL — ABNORMAL LOW (ref 3.87–5.11)
RDW: 16 % — ABNORMAL HIGH (ref 11.5–15.5)
WBC: 17.4 10*3/uL — ABNORMAL HIGH (ref 4.0–10.5)
nRBC: 0 % (ref 0.0–0.2)

## 2020-02-12 LAB — MAGNESIUM: Magnesium: 2.2 mg/dL (ref 1.7–2.4)

## 2020-02-12 LAB — TRIGLYCERIDES: Triglycerides: 90 mg/dL (ref <150)

## 2020-02-12 MED ORDER — HYDRALAZINE HCL 20 MG/ML IJ SOLN
5.0000 mg | INTRAMUSCULAR | Status: DC | PRN
  Administered 2020-02-12: 10 mg via INTRAVENOUS
  Administered 2020-02-12: 5 mg via INTRAVENOUS
  Administered 2020-02-12 – 2020-02-13 (×2): 20 mg via INTRAVENOUS
  Administered 2020-02-13: 10 mg via INTRAVENOUS
  Administered 2020-02-14 (×2): 20 mg via INTRAVENOUS
  Administered 2020-02-14: 15 mg via INTRAVENOUS
  Administered 2020-02-15 (×3): 20 mg via INTRAVENOUS
  Administered 2020-02-17: 10 mg via INTRAVENOUS
  Administered 2020-02-17: 20 mg via INTRAVENOUS
  Filled 2020-02-12 (×12): qty 1

## 2020-02-12 MED ORDER — SODIUM CHLORIDE 0.45 % IV SOLN: INTRAVENOUS | Status: DC

## 2020-02-12 MED ORDER — POTASSIUM CHLORIDE 20 MEQ/15ML (10%) PO SOLN
40.0000 meq | Freq: Once | ORAL | Status: AC
  Administered 2020-02-12: 40 meq

## 2020-02-12 MED ORDER — POTASSIUM PHOSPHATES 15 MMOLE/5ML IV SOLN
30.0000 mmol | Freq: Once | INTRAVENOUS | Status: AC
  Administered 2020-02-12: 30 mmol via INTRAVENOUS
  Filled 2020-02-12: qty 10

## 2020-02-12 MED ORDER — HEPARIN SODIUM (PORCINE) 5000 UNIT/ML IJ SOLN
5000.0000 [IU] | Freq: Three times a day (TID) | INTRAMUSCULAR | Status: DC
  Administered 2020-02-12 – 2020-02-22 (×30): 5000 [IU] via SUBCUTANEOUS
  Filled 2020-02-12 (×30): qty 1

## 2020-02-12 MED ORDER — ALBUTEROL SULFATE (2.5 MG/3ML) 0.083% IN NEBU
2.5000 mg | INHALATION_SOLUTION | Freq: Four times a day (QID) | RESPIRATORY_TRACT | Status: DC
  Administered 2020-02-12 – 2020-02-16 (×18): 2.5 mg via RESPIRATORY_TRACT
  Filled 2020-02-12 (×18): qty 3

## 2020-02-12 MED ORDER — POTASSIUM CHLORIDE 20 MEQ/15ML (10%) PO SOLN
40.0000 meq | Freq: Once | ORAL | Status: DC
  Filled 2020-02-12: qty 30

## 2020-02-12 MED FILL — Thrombin For Soln 5000 Unit: CUTANEOUS | Qty: 5000 | Status: AC

## 2020-02-12 NOTE — Progress Notes (Signed)
vLTM EEG complete. No skin breakdown 

## 2020-02-12 NOTE — Procedures (Signed)
Patient Name: Meredith Cooper  MRN: [id number]  Epilepsy Attending: Charlsie Quest  Referring Physician/Provider: Dr Caryl Pina Duration: 02/12/2020 0700 to 02/12/2020 1044  Patient history:  64 y.o.female who presented to ED as a code stroke for left gaze and right side flaccid. EEG to evaluate for seizure.  Level of alertness: comatose  AEDs during EEG study: propofol, keppra  Technical aspects: This EEG study was done with scalp electrodes positioned according to the 10-20 International system of electrode placement. Electrical activity was acquired at a sampling rate of 500Hz  and reviewed with a high frequency filter of 70Hz  and a low frequency filter of 1Hz . EEG data were recorded continuously and digitally stored.   DESCRIPTION: EEG showed continuous generalized and lateralized left hemisphere polymorphic 3-6hz  sharply contoured theta-delta slowing which at times appears sharply contoured. Hyperventilation and photic stimulation were not performed.  ABNORMALITY - Continuous slow, generalized and lateralized left hemisphere  IMPRESSION: This study is suggestive of cortical dysfunction in left hemisphere likely secondary to underlying hemorrhage. Additionally, there is evidence of severe diffuse encephalopathy, non specific to etiology. No seizures or definite epileptiform discharges were seen throughout the recording.    Charma Mocarski 

## 2020-02-12 NOTE — Progress Notes (Signed)
  NEUROSURGERY PROGRESS NOTE   Patient is POD2 s/p craniotomy for evacuation of hematoma. I came by for a wound check. Wound with bandage in place. Dried blood without active drainage.  No new NS recs. Continue care per [facility] and Neurology. Please call for any concerns.

## 2020-02-12 NOTE — Progress Notes (Signed)
NAME:  Meredith Cooper, MRN:  [id number], DOB:  [birth year], LOS: 3 ADMISSION DATE:  02/13/2020, CONSULTATION DATE:  01/26/2020 REFERRING MD:  Pearlean Brownie - neuro, CHIEF COMPLAINT:  Code stroke - L gaze deviation, R sided weakness   Brief History   64 yowf never smoker  presented to [facility] ED 4/16 as code stroke with sudden onset R sided weakness, L gaze deviation. Rapid deterioration in ED; patient vomited multiple times in CT, then required intubation for airway protection. CTH with large acute parenchymal hemorrhage in L frontal lobe, 8.2 x 6.5 x 5.6 cm, 13mm midline shift.   Past Medical History  HTN HLD Hiatal hernia GERD S/p hysterectomy   Significant Hospital Events   4/16 Code stroke, large ICH, Intubated.  4/17 overnight possible seizure activity, received ativan and started Keppra. Max on cleviprex, added PRN hydralazine for SBP goal <140 4/17  no sz 4/17 pm craniotomy for hematoma evacuation   Consults:  NSGY PCCM  Stroke team   Procedures:  4/16 ETT >  4/16 CVC>> 4/16 Arterial line>>  4/17 Craniotomy/ no drain  Significant Diagnostic Tests:  4/16 CT H> Large acute intraparenchymal hemorrhage centered in L frontal lobe 8.2 x 6.5 x 5.6 cm, 38mm midline shift.  4/16 CTA H> no aneurysm  4/17 CT H> unchanged size of massive IPH centered in L frontal lobe. Interval increase in amount of blood in R lateral ventricle occipital horn. 31mm midline shift  4/17 MRI Brain >>  1. Unchanged large left frontoparietal parenchymal hematoma with unchanged rightward midline shift. Mild surrounding edema and restricted diffusion consistent with associated acute infarct. 2. Small volume intraventricular and subarachnoid hemorrhage. 3. Scattered punctate acute infarcts in both cerebral hemispheres suggesting emboli. - echo 4/17 with mod PAS elevation/ mild LVH  Micro Data:  4/16 SARS Cov2   neg  4/16  MRSA PCR  neg  Respiratory culture 4/18-rare GPC's   Antimicrobials:  4/16 cefepime>> added for  possible aspiration event  4/17 Vanc  X one dose in OR   Interim history/subjective:   No acute events overnight. Remains in vent  Objective   Blood pressure 136/63, pulse 61, temperature 99.1 F (37.3 C), temperature source Axillary, resp. rate 14, height 5\' 4"  (1.626 m), weight 109.3 kg, SpO2 99 %.    Vent Mode: PRVC FiO2 (%):  [40 %-60 %] 40 % Set Rate:  [20 bmp] 20 bmp Vt Set:  [440 mL] 440 mL PEEP:  [5 cmH20] 5 cmH20 Plateau Pressure:  [11 cmH20-18 cmH20] 14 cmH20   Intake/Output Summary (Last 24 hours) at 02/12/2020 0802 Last data filed at 02/12/2020 0700 Gross per 24 hour  Intake 2917.94 ml  Output 1745 ml  Net 1172.94 ml   Filed Weights   02/02/2020 1500 01/28/2020 1559  Weight: 109.3 kg 109.3 kg    Examination: Gen:      No acute distress, obese HEENT:  EOMI, sclera anicteric Neck:     No masses; no thyromegaly, ETT Lungs:    Clear to auscultation bilaterally; normal respiratory effort CV:         Regular rate and rhythm; no murmurs Abd:      + bowel sounds; soft, non-tender; no palpable masses, no distension Ext:    No edema; adequate peripheral perfusion Skin:      Warm and dry; no rash Neuro: Sedated   Lab significant for sodium 160, potassium 3, BUN/creatinine 29/0.72, WBC 17.4, hemoglobin 8 No new imaging to review.     Resolved Hospital Problem  list     Assessment & Plan:   Acute hypoxic respiratory failure with failure to protect airway, likely aspiration event, in setting of intraparenchymal hemorrhage  -intubated in ED with failed airway protection after vomiting P Continue mechanical ventilation SBT's when mental status tolerates Follow intermittent chest x-ray  Large ICH with cytotoxic cerebral edema  Possible seizure activity  -s/p craniotomy pm 4/17 s drain P Neuro managing Goal SBP <160 Off 3% saline. Getting 1/2 NS to get Na into target range SCD only   HTN - echo 4/17 with mod PAS elevation/ mild LVH P ICU  monitoring  Hypokalemia Hypophosphatemia  P Replete lytes  Leukocytosis with possible HCAP/ LLL   P Continue cefepime. Can stop after 5 days  Hyperglycemia P SSI  Mild acute blood loss anemia  -post craniotomy 4/17  >>> theshold to transfuse at < 7 gm hgb  At risk malnutrition P Continue tube feeds   Best practice:  Diet: EN per RDN  Pain/Anxiety/Delirium protocol (if indicated): RASS goal 0, PRN fentanyl PRN versed Propofol gtt available  VAP protocol (if indicated): yes DVT prophylaxis: SCD only  GI prophylaxis: protonix  Glucose control: SSI Mobility: BR Code Status: Full  Family Communication: Daughter updated at bedside Disposition: ICU   The patient is critically ill with multiple organ system failure and requires high complexity decision making for assessment and support, frequent evaluation and titration of therapies, advanced monitoring, review of radiographic studies and interpretation of complex data.   Critical Care Time devoted to patient care services, exclusive of separately billable procedures, described in this note is 35 minutes.   Marshell Garfinkel MD  Pulmonary and Critical Care Please see [email or web address] for pager details.  02/12/2020, 8:03 AM

## 2020-02-12 NOTE — Progress Notes (Signed)
Patient transported to ct for head ct. Exam Deferred by CT tech due to placement of EEG leads.

## 2020-02-12 NOTE — Progress Notes (Signed)
STROKE TEAM PROGRESS NOTE   INTERVAL HISTORY Daughter and RN are at bedside.  Patient still intubated on low-dose propofol sedation.  Open eyes on voice, however not tracking not following commands.  Left upper extremity against gravity with pain stimulation.  CT repeat showed significant decreased hematoma after evacuation.   OBJECTIVE Vitals:   02/12/20 0630 02/12/20 0645 02/12/20 0700 02/12/20 0800  BP: 129/69 133/62 136/63   Pulse: 63 63 61   Resp: 15 16 14    Temp:    99 F (37.2 C)  TempSrc:    Axillary  SpO2: 99% 99% 99%   Weight:      Height:        CBC:  Recent Labs  Lab 02/12/2020 1523 01/28/2020 1621 02/11/20 0202 02/12/20 0551  WBC 11.6*   < > 14.7* 17.4*  NEUTROABS 5.8  --   --   --   HGB 10.8*   < > 8.6* 8.0*  HCT 35.5*   < > 29.2* 27.9*  MCV 84.7   < > 87.2 89.4  PLT 307   < > 235 213   < > = values in this interval not displayed.    Basic Metabolic Panel:  Recent Labs  Lab 02/11/20 0202 02/11/20 0741 02/12/20 0234 02/12/20 0551  NA 157*   < > 161* 160*  K 3.5  --   --  3.0*  CL >130*  --   --  127*  CO2 21*  --   --  24  GLUCOSE 118*  --   --  153*  BUN 20  --   --  29*  CREATININE 0.81  --   --  0.72  CALCIUM 7.8*  --   --  8.5*  MG 2.1  --   --  2.2  PHOS 2.2*  --   --  1.8*   < > = values in this interval not displayed.   Lipid Panel:     Component Value Date/Time   CHOL 164 01/30/2020 0347   TRIG 90 02/12/2020 0551   HDL 55 02/07/2020 0347   CHOLHDL 3.0 02/08/2020 0347   VLDL 35 02/17/2020 0347   LDLCALC 74 01/31/2020 0347   HgbA1c:  Lab Results  Component Value Date   HGBA1C 5.6 02/03/2020   Urine Drug Screen:     Component Value Date/Time   LABOPIA NONE DETECTED 01/28/2020 1704   COCAINSCRNUR NONE DETECTED 02/19/2020 1704   LABBENZ NONE DETECTED 02/20/2020 1704   AMPHETMU NONE DETECTED 01/27/2020 1704   THCU NONE DETECTED 01/29/2020 1704   LABBARB NONE DETECTED 02/14/2020 1704    Alcohol Level     Component Value  Date/Time   ETH <10 02/18/2020 1516    IMAGING CT HEAD CODE STROKE WO CONTRAST 02/19/2020   IMPRESSION:  Very large acute parenchymal hemorrhage centered within the left frontal lobe measuring 8.2 x 6.5 x 5.6 cm. Surrounding parenchymal edema. Mild intraventricular extension of hemorrhage into the left foramen of Monro, third ventricle and cerebral aqueduct. Associated mass effect with partial effacement of the left lateral ventricle. 5 mm rightward midline shift at the level of the septum pellucidum.   CT Code Stroke CTA Head W/WO contrast 01/31/2020 IMPRESSION:  1. No intracranial large vessel occlusion or proximal high-grade arterial stenosis.  2. No evidence of vascular malformation on the current exam. However, consider imaging follow-up once the hematoma involutes for further assessment.  3. No contrast blush is demonstrated in the region of the acute left frontal  lobe hematoma on arterial phase imaging to suggest active extravasation.  4. No intracranial aneurysm is identified.    CT Code Stroke CTA Neck W/WO contrast Feb 22, 2020 IMPRESSION:  The bilateral common carotid, internal carotid and vertebral arteries are patent within the neck without significant stenosis.   CT HEAD WO CONTRAST 02/14/2020 IMPRESSION:  1. Unchanged size of massive intraparenchymal hematoma centered in the left frontal lobe.  2. Increased amount of blood in the occipital horn of the right lateral ventricle.  3. Unchanged rightward midline shift, measuring 5 mm.   MR BRAIN WO CONTRAST 02/02/2020 IMPRESSION 1. Unchanged large left frontoparietal parenchymal hematoma with unchanged rightward midline shift. Mild surrounding edema and restricted diffusion consistent with associated acute infarct. 2. Small volume intraventricular and subarachnoid hemorrhage. 3. Scattered punctate acute infarcts in both cerebral hemispheres suggesting emboli.   CT HEAD WO CONTRAST 02/12/2020 IMPRESSION:  pending  DG Chest  Port 1 View Feb 22, 2020 Increasing left basilar atelectasis. Central line as described without pneumothorax.  Feb 22, 2020 Tubes and lines as described above. The gastric catheter lies in the distal esophagus and should be advanced several cm further into the stomach. Increased interstitial markings likely related to the poor inspiratory effort however mild edema cannot be excluded.  02/05/2020 No change in small left pleural effusion and adjacent Airspace disease, likely atelectasis. 02/11/2020 1. Well-positioned support structures. No pneumothorax. 2. Stable hazy bibasilar lung opacities, favor atelectasis. 3. Stable mild cardiomegaly without overt pulmonary edema.   DG Abd Portable 1V 02/22/20 IMPRESSION:  Gastric catheter as described.   Transthoracic Echocardiogram  1. Left ventricular ejection fraction, by estimation, is 65 to 70%. The left ventricle has normal function. The left ventricle has no regional wall motion abnormalities. There is mild left ventricular hypertrophy. Left ventricular diastolic parameters are indeterminate.  2. Right ventricular systolic function is normal. The right ventricular size is normal. There is moderately elevated pulmonary artery systolic pressure.  3. The mitral valve is normal in structure. Trivial mitral valve regurgitation.  4. Tricuspid valve regurgitation is moderate.  5. The aortic valve is tricuspid. Aortic valve regurgitation is trivial. No aortic stenosis is present.  LTM EEG  cortical dysfunction in left hemisphere likely secondary to underlying hemorrhage. Additionally, there is evidence of severe diffuse encephalopathy, non specific to etiology. No seizures or definite epileptiform discharges were seen throughout the recording. Event button was pressed on 01/25/2020 as described above without concomitant eeg change and was likely not epileptic.   PHYSICAL EXAM  Temp:  [98.7 F (37.1 C)-100.6 F (38.1 C)] 99 F (37.2 C) (04/19  0800) Pulse Rate:  [55-100] 61 (04/19 0700) Resp:  [4-36] 14 (04/19 0700) BP: (82-165)/(47-133) 136/63 (04/19 0700) SpO2:  [92 %-100 %] 99 % (04/19 0700) Arterial Line BP: (138-170)/(48-58) 160/53 (04/18 1400) FiO2 (%):  [40 %-60 %] 40 % (04/19 0835)  General - Well nourished, well developed, intubated on low dose sedation.  Ophthalmologic - fundi not visualized due to noncooperation.  Cardiovascular - Regular rate and rhythm.  Neuro - intubated on low dose propofol, eyes closed but open on voice easily, however not following commands. Eyes in left gaze position, not blinking to visual threat, doll's eyes sluggish, not tracking, PERRL. Corneal reflex weak on the right but present on the left, gag and cough present. Breathing over the vent.  Facial symmetry not able to test due to ET tube.  Tongue protrusion not cooperative. On pain stimulation, left UE against gravity but only slight withdraw on other limbs. DTR 1+ and  bilateral positive babinski. Sensation, coordination and gait not tested.   ASSESSMENT/PLAN [name] is a 64 y.o. female with history of Htn, Hld and estrogen therapy presenting with left gaze and right side flaccid. Vomited and was intubated  She did not receive IV t-PA due to ICH.  Stroke:  left MCA and bilateral ACA infarcts with large hematoma centered within the left frontal lobe likely due to hemorrhagic conversion s/p crani for hematoma evacuation - infarct unclear etiology, likely cardioembolic source  CT Head 02/12/2020 - Very large acute parenchymal hemorrhage centered within the left frontal lobe measuring 8.2 x 6.5 x 5.6 cm. 5 mm rightward midline shift at the level of the septum pellucidum.   CT head - 02/19/2020 - Unchanged size of massive intraparenchymal hematoma centered in the left frontal lobe.  MRI head - unchanged hematoma and surrounding edema. DWI signal at left MCA and ACA consistent with acute infarcts. Scattered punctate acute infarcts in both  cerebral hemispheres suggesting emboli  CTA H&N - No large vessel occlusion or high-grade arterial stenosis. No evidence of vascular malformation on the current exam.  CT repeat 4/19 - significantly decreased hematoma post evacuation  2D Echo - EF 65-70%   Will consider further cardioembolic work-up if patient stabilized and make neuro improvement.  [employer] Virus 2 - negative  LDL - 74  HgbA1c - 5.6  UDS - negative  VTE prophylaxis - heparin subq  No antithrombotic prior to admission, now on No antithrombotic due to hemorrhage  Therapy recommendations:  pending  Disposition:  Pending  Acute respiratory failure  Intubated for airway protection  On sedation -> low dose propofol  CCM on board  Wean as able  Possible aspiration Pneumonia  Nausea vomiting on presentation  Cefepime 4/16 >>  CCM on board  Temp - 100.5 -> 100.7->101.6 -> afebrile  WBCs - 11.6->14.2->14.7->17.4  CXR - stable lung opacities, favor atx. Stable cardiomegaly w/o overt edema  Cerebral edema  5 mm Rightward Midline Shift on CT and MRI  NSG on board s/p hematoma evacuation 4/17   On 3% at 75 cc/hr -> 50 cc -> NS@50  -> 0.45%@75    23.4% saline x1  Na - 139->142->146->157->159->161->160->159  Na every 6h  UE shivering/shaking  Bilateral versus unilateral arm shaking/shivering  LTM EEG no seizure to correlate the shaking -> discontinued 4/19  Keppra - 500 mg Q 12 hrs after Keppra 2 g load  Seizure precautions  Hypertension  Home BP meds: Zestril 10 mg daily  off Cleviprex and normodyne (infusion)  On norvasc 10 and lisinopril 20   Stable  . SBP goal < 160 mm Hg  . Long-term BP goal normotensive  Hyperlipidemia  Home Lipid lowering medication: Pravachol 20 mg daily  LDL 74, goal < 70  Current lipid lowering medication: None for now  May resume statin at discharge vs. SATURN trial  Dysphagia At risk Malnutrition   N.p.o.  On tube feeding at 25  cc/h  Speech following  Dietitian on board  Anemia, acute blood loss  Hb - 10.8->10.0->8.6->8.0   Likely due to post op  S/p PRBC in OR  Iron and ferritin pending  CBC monitoring  Other Stroke Risk Factors  Advanced age  Morbid Obesity, Body mass index is 41.36 kg/m., recommend weight loss, diet and exercise as appropriate   On estrogen daily po PTA  Other Active Problems  Code status - Full code  Hypokalemia - treated - K 3.4->4.1->3.5->3.0->3.1- supplement  Hypophosphatemia - treated -  Phos 1.7->2.2->1.8  Family requested COVID Ab testing after COVID vaccine - will check with ID for guidance   Hospital day # 3  This patient is critically ill due to large ICH, hemorrhagic conversion, hypertensive emergency, seizure, respiratory failure and at significant risk of neurological worsening, death form cerebral edema, brain herniation, hematoma expansion, recurrent stroke, epilepsy status. This patient's care requires constant monitoring of vital signs, hemodynamics, respiratory and cardiac monitoring, review of multiple databases, neurological assessment, discussion with family, other specialists and medical decision making of high complexity. I spent 40 minutes of neurocritical care time in the care of this patient. I had long discussion with daughter at bedside, updated pt current condition, treatment plan and potential prognosis, and answered all the questions.  She expressed understanding and appreciation.   Rosalin Hawking, MD PhD Stroke Neurology 02/12/2020 8:41 AM   To contact Stroke Continuity provider, please refer to [email or web address] After hours, contact General Neurology

## 2020-02-12 NOTE — Progress Notes (Addendum)
OT Cancellation Note  Patient Details Name: Meredith Cooper MRN: [id number] DOB: Apr 26, 1956   Cancelled Treatment:    Reason Eval/Treat Not Completed: Patient not medically ready;Active bedrest order(Intubated. EEG. Not following commands.) Will return as schedule allows and pt medically ready. Thank you.  Deepa Barthel M [name] Chavon Lucarelli MSOT, OTR/L Acute Rehab Pager: [phone] Office: [phone] 02/12/2020, 7:52 AM

## 2020-02-12 NOTE — Social Work (Signed)
CSW was unable to complete sbirt due to pt being on the vent. Please re consult at more appropriate time.  Jimmy Picket, Theresia Majors, [place] Clinical Social Worker [phone]

## 2020-02-12 NOTE — Progress Notes (Signed)
PT Cancellation Note  Patient Details Name: Meredith Cooper MRN: [id number] DOB: 1956/10/14   Cancelled Treatment:    Reason Eval/Treat Not Completed: Patient not medically ready Active bedrest order(Intubated. EEG. Not following commands.) Will return as schedule allows and pt medically ready. Thank you.  Lewis Shock, PT, DPT Acute Rehabilitation Services Pager #: [phone] Office #: [phone]    Iona Hansen 02/12/2020, 9:40 AM

## 2020-02-13 ENCOUNTER — Inpatient Hospital Stay (HOSPITAL_COMMUNITY): Payer: Medicare Other

## 2020-02-13 ENCOUNTER — Encounter (HOSPITAL_COMMUNITY): Payer: Self-pay | Admitting: Neurology

## 2020-02-13 DIAGNOSIS — I10 Essential (primary) hypertension: Secondary | ICD-10-CM | POA: Diagnosis not present

## 2020-02-13 DIAGNOSIS — J9601 Acute respiratory failure with hypoxia: Secondary | ICD-10-CM | POA: Diagnosis not present

## 2020-02-13 DIAGNOSIS — Z5309 Procedure and treatment not carried out because of other contraindication: Secondary | ICD-10-CM

## 2020-02-13 DIAGNOSIS — Z978 Presence of other specified devices: Secondary | ICD-10-CM | POA: Diagnosis not present

## 2020-02-13 DIAGNOSIS — I4891 Unspecified atrial fibrillation: Secondary | ICD-10-CM | POA: Diagnosis not present

## 2020-02-13 DIAGNOSIS — R509 Fever, unspecified: Secondary | ICD-10-CM

## 2020-02-13 DIAGNOSIS — D72829 Elevated white blood cell count, unspecified: Secondary | ICD-10-CM

## 2020-02-13 DIAGNOSIS — D62 Acute posthemorrhagic anemia: Secondary | ICD-10-CM | POA: Diagnosis not present

## 2020-02-13 DIAGNOSIS — I161 Hypertensive emergency: Secondary | ICD-10-CM | POA: Diagnosis not present

## 2020-02-13 LAB — BASIC METABOLIC PANEL
Anion gap: 7 (ref 5–15)
BUN: 35 mg/dL — ABNORMAL HIGH (ref 8–23)
CO2: 24 mmol/L (ref 22–32)
Calcium: 8.5 mg/dL — ABNORMAL LOW (ref 8.9–10.3)
Chloride: 127 mmol/L — ABNORMAL HIGH (ref 98–111)
Creatinine, Ser: 0.76 mg/dL (ref 0.44–1.00)
GFR calc Af Amer: >60 mL/min (ref >60)
GFR calc non Af Amer: >60 mL/min (ref >60)
Glucose, Bld: 120 mg/dL — ABNORMAL HIGH (ref 70–99)
Potassium: 3.6 mmol/L (ref 3.5–5.1)
Sodium: 158 mmol/L — ABNORMAL HIGH (ref 135–145)

## 2020-02-13 LAB — URINALYSIS, COMPLETE (UACMP) WITH MICROSCOPIC
Bacteria, UA: NONE SEEN
Bilirubin Urine: NEGATIVE
Glucose, UA: NEGATIVE mg/dL
Hgb urine dipstick: NEGATIVE
Ketones, ur: NEGATIVE mg/dL
Leukocytes,Ua: NEGATIVE
Nitrite: NEGATIVE
Protein, ur: 100 mg/dL — AB
Specific Gravity, Urine: 1.032 — ABNORMAL HIGH (ref 1.005–1.030)
pH: 5 (ref 5.0–8.0)

## 2020-02-13 LAB — GLUCOSE, CAPILLARY
Glucose-Capillary: 102 mg/dL — ABNORMAL HIGH (ref 70–99)
Glucose-Capillary: 119 mg/dL — ABNORMAL HIGH (ref 70–99)
Glucose-Capillary: 131 mg/dL — ABNORMAL HIGH (ref 70–99)
Glucose-Capillary: 133 mg/dL — ABNORMAL HIGH (ref 70–99)
Glucose-Capillary: 145 mg/dL — ABNORMAL HIGH (ref 70–99)
Glucose-Capillary: 148 mg/dL — ABNORMAL HIGH (ref 70–99)

## 2020-02-13 LAB — PHOSPHORUS: Phosphorus: 2.3 mg/dL — ABNORMAL LOW (ref 2.5–4.6)

## 2020-02-13 LAB — CBC
HCT: 29.5 % — ABNORMAL LOW (ref 36.0–46.0)
Hemoglobin: 8.6 g/dL — ABNORMAL LOW (ref 12.0–15.0)
MCH: 25.8 pg — ABNORMAL LOW (ref 26.0–34.0)
MCHC: 29.2 g/dL — ABNORMAL LOW (ref 30.0–36.0)
MCV: 88.6 fL (ref 80.0–100.0)
Platelets: 254 10*3/uL (ref 150–400)
RBC: 3.33 MIL/uL — ABNORMAL LOW (ref 3.87–5.11)
RDW: 16.2 % — ABNORMAL HIGH (ref 11.5–15.5)
WBC: 19.5 10*3/uL — ABNORMAL HIGH (ref 4.0–10.5)
nRBC: 0.4 % — ABNORMAL HIGH (ref 0.0–0.2)

## 2020-02-13 LAB — CULTURE, RESPIRATORY W GRAM STAIN: Culture: NORMAL

## 2020-02-13 LAB — FERRITIN: Ferritin: 66 ng/mL (ref 11–307)

## 2020-02-13 LAB — SAR COV2 SEROLOGY (COVID19)AB(IGG),IA: SARS-CoV-2 Ab, IgG: REACTIVE — AB

## 2020-02-13 LAB — TRIGLYCERIDES: Triglycerides: 114 mg/dL (ref <150)

## 2020-02-13 LAB — MAGNESIUM: Magnesium: 2.1 mg/dL (ref 1.7–2.4)

## 2020-02-13 LAB — IRON AND TIBC
Iron: 16 ug/dL — ABNORMAL LOW (ref 28–170)
Saturation Ratios: 5 % — ABNORMAL LOW (ref 10.4–31.8)
TIBC: 337 ug/dL (ref 250–450)
UIBC: 321 ug/dL

## 2020-02-13 LAB — SODIUM
Sodium: 156 mmol/L — ABNORMAL HIGH (ref 135–145)
Sodium: 156 mmol/L — ABNORMAL HIGH (ref 135–145)
Sodium: 151 mmol/L — ABNORMAL HIGH (ref 135–145)

## 2020-02-13 MED ORDER — FERROUS SULFATE 300 (60 FE) MG/5ML PO SYRP
300.0000 mg | ORAL_SOLUTION | Freq: Two times a day (BID) | ORAL | Status: DC
  Administered 2020-02-13 – 2020-02-22 (×18): 300 mg
  Filled 2020-02-13 (×18): qty 5

## 2020-02-13 MED ORDER — AMIODARONE HCL IN DEXTROSE 360-4.14 MG/200ML-% IV SOLN
60.0000 mg/h | INTRAVENOUS | Status: AC
  Administered 2020-02-13 (×2): 60 mg/h via INTRAVENOUS
  Filled 2020-02-13 (×2): qty 200

## 2020-02-13 MED ORDER — SODIUM CHLORIDE 0.9 % IV SOLN
INTRAVENOUS | Status: DC | PRN
  Administered 2020-02-13: 500 mL via INTRAVENOUS

## 2020-02-13 MED ORDER — FERROUS SULFATE 325 (65 FE) MG PO TABS: 325.0000 mg | ORAL_TABLET | Freq: Two times a day (BID) | ORAL | Status: DC

## 2020-02-13 MED ORDER — AMIODARONE LOAD VIA INFUSION
150.0000 mg | Freq: Once | INTRAVENOUS | Status: AC
  Administered 2020-02-13: 150 mg via INTRAVENOUS
  Filled 2020-02-13: qty 83.34

## 2020-02-13 MED ORDER — DILTIAZEM HCL-DEXTROSE 125-5 MG/125ML-% IV SOLN (PREMIX)
5.0000 mg/h | INTRAVENOUS | Status: DC
  Administered 2020-02-13: 5 mg/h via INTRAVENOUS
  Administered 2020-02-13: 12.5 mg/h via INTRAVENOUS
  Administered 2020-02-14: 10 mg/h via INTRAVENOUS
  Administered 2020-02-14 – 2020-02-15 (×2): 7.5 mg/h via INTRAVENOUS
  Administered 2020-02-16: 5 mg/h via INTRAVENOUS
  Filled 2020-02-13 (×7): qty 125

## 2020-02-13 MED ORDER — AMIODARONE HCL IN DEXTROSE 360-4.14 MG/200ML-% IV SOLN
30.0000 mg/h | INTRAVENOUS | Status: DC
  Administered 2020-02-13 – 2020-02-16 (×5): 30 mg/h via INTRAVENOUS
  Filled 2020-02-13 (×7): qty 200

## 2020-02-13 MED ORDER — FUROSEMIDE 10 MG/ML IJ SOLN
40.0000 mg | Freq: Once | INTRAMUSCULAR | Status: AC
  Administered 2020-02-13: 40 mg via INTRAVENOUS
  Filled 2020-02-13: qty 4

## 2020-02-13 MED ORDER — SODIUM PHOSPHATES 45 MMOLE/15ML IV SOLN
10.0000 mmol | Freq: Once | INTRAVENOUS | Status: AC
  Administered 2020-02-13: 10 mmol via INTRAVENOUS
  Filled 2020-02-13: qty 3.33

## 2020-02-13 MED ORDER — POTASSIUM CHLORIDE 10 MEQ/50ML IV SOLN
10.0000 meq | INTRAVENOUS | Status: AC
  Administered 2020-02-13 (×4): 10 meq via INTRAVENOUS
  Filled 2020-02-13 (×4): qty 50

## 2020-02-13 MED ORDER — AMIODARONE LOAD VIA INFUSION
150.0000 mg | Freq: Once | INTRAVENOUS | Status: AC
  Administered 2020-02-13: 150 mg via INTRAVENOUS

## 2020-02-13 NOTE — Progress Notes (Signed)
Upon arrival to shift pt HR still 160s at times, MD Everardo All made aware and at bedside. Amio bolus given per order and continued current treatment. [id number] patient converted to NSR.

## 2020-02-13 NOTE — Progress Notes (Signed)
Attempted to see pt. Pt remains on many medications to control HR and is not medically stable for therapy at this time.  Will sign off at this time and d/c consult.  Please reorder when pt more stable. Tory Emerald, Tiger [phone]

## 2020-02-13 NOTE — Progress Notes (Signed)
  CCMD notified of patient being maxed out on cardizem drip and rate remaining uncontrolled going high as 209. RN will continue to monitor.

## 2020-02-13 NOTE — Progress Notes (Signed)
STROKE TEAM PROGRESS NOTE   INTERVAL HISTORY Daughter at bedside. Pt still intubated on sedation. Had Afib RVR this am with HR went up to 200s. Received amiodarone and the Cardizem drip, heart rate converted back to sinus after.  Patient symptom more sedated today, less responsive as yesterday.  Sodium 156.  Patient had temperature 101.1 this morning, continue cefepime.  OBJECTIVE Vitals:   02/13/20 0800 02/13/20 0830 02/13/20 0900 02/13/20 0915  BP: (!) 113/55 (!) 111/59 (!) 100/55 (!) 108/58  Pulse: (!) 139 73 74 71  Resp: (!) 7 20 (!) 21 (!) 25  Temp: (!) 101.1 F (38.4 C)     TempSrc: Axillary     SpO2: 100% 100% 100% 100%  Weight:      Height:        CBC:  Recent Labs  Lab 02-21-20 1523 02/21/20 1621 02/12/20 0551 02/13/20 0347  WBC 11.6*   < > 17.4* 19.5*  NEUTROABS 5.8  --   --   --   HGB 10.8*   < > 8.0* 8.6*  HCT 35.5*   < > 27.9* 29.5*  MCV 84.7   < > 89.4 88.6  PLT 307   < > 213 254   < > = values in this interval not displayed.    Basic Metabolic Panel:  Recent Labs  Lab 02/12/20 0551 02/12/20 0551 02/12/20 0820 02/12/20 1425 02/13/20 0347 02/13/20 0815  NA 160*   < > 159*   < > 158* 156*  K 3.0*   < > 3.1*  --  3.6  --   CL 127*   < > 126*  --  127*  --   CO2 24   < > 23  --  24  --   GLUCOSE 153*   < > 130*  --  120*  --   BUN 29*   < > 29*  --  35*  --   CREATININE 0.72   < > 0.72  --  0.76  --   CALCIUM 8.5*   < > 8.6*  --  8.5*  --   MG 2.2  --   --   --  2.1  --   PHOS 1.8*  --   --   --  2.3*  --    < > = values in this interval not displayed.   Lipid Panel:     Component Value Date/Time   CHOL 164 02/03/2020 0347   TRIG 114 02/13/2020 0347   HDL 55 02/08/2020 0347   CHOLHDL 3.0 02/07/2020 0347   VLDL 35 02/22/2020 0347   LDLCALC 74 01/25/2020 0347   HgbA1c:  Lab Results  Component Value Date   HGBA1C 5.6 2020-02-21   Urine Drug Screen:     Component Value Date/Time   LABOPIA NONE DETECTED 2020/02/21 1704   COCAINSCRNUR  NONE DETECTED February 21, 2020 1704   LABBENZ NONE DETECTED 02/21/2020 1704   AMPHETMU NONE DETECTED February 21, 2020 1704   THCU NONE DETECTED 21-Feb-2020 1704   LABBARB NONE DETECTED 21-Feb-2020 1704    Alcohol Level     Component Value Date/Time   ETH <10 02/21/2020 1516    IMAGING CT HEAD CODE STROKE WO CONTRAST 2020/02/21   IMPRESSION:  Very large acute parenchymal hemorrhage centered within the left frontal lobe measuring 8.2 x 6.5 x 5.6 cm. Surrounding parenchymal edema. Mild intraventricular extension of hemorrhage into the left foramen of Monro, third ventricle and cerebral aqueduct. Associated mass effect with partial effacement of the  left lateral ventricle. 5 mm rightward midline shift at the level of the septum pellucidum.   CT Code Stroke CTA Head W/WO contrast 01/28/2020 IMPRESSION:  1. No intracranial large vessel occlusion or proximal high-grade arterial stenosis.  2. No evidence of vascular malformation on the current exam. However, consider imaging follow-up once the hematoma involutes for further assessment.  3. No contrast blush is demonstrated in the region of the acute left frontal lobe hematoma on arterial phase imaging to suggest active extravasation.  4. No intracranial aneurysm is identified.    CT Code Stroke CTA Neck W/WO contrast 02/21/2020 IMPRESSION:  The bilateral common carotid, internal carotid and vertebral arteries are patent within the neck without significant stenosis.   CT HEAD WO CONTRAST 2020-03-08 IMPRESSION:  1. Unchanged size of massive intraparenchymal hematoma centered in the left frontal lobe.  2. Increased amount of blood in the occipital horn of the right lateral ventricle.  3. Unchanged rightward midline shift, measuring 5 mm.   MR BRAIN WO CONTRAST 08-Mar-2020 IMPRESSION 1. Unchanged large left frontoparietal parenchymal hematoma with unchanged rightward midline shift. Mild surrounding edema and restricted diffusion consistent with associated  acute infarct. 2. Small volume intraventricular and subarachnoid hemorrhage. 3. Scattered punctate acute infarcts in both cerebral hemispheres suggesting emboli.   CT HEAD WO CONTRAST 02/12/2020 IMPRESSION:  1. Status post left frontal parietal craniotomy for evacuation of the hemorrhage. 2. Hemorrhage is significantly debulked. Morselized Gel-Foam is present within the surgical cavity. 3. No new hemorrhage. 4. Improved midline shift, now measuring 4 mm. 5. Stable intraventricular hemorrhage without hydrocephalus.  DG Chest Port 1 View 02/08/2020 Increasing left basilar atelectasis. Central line as described without pneumothorax.  02/16/2020 Tubes and lines as described above. The gastric catheter lies in the distal esophagus and should be advanced several cm further into the stomach. Increased interstitial markings likely related to the poor inspiratory effort however mild edema cannot be excluded.  Mar 08, 2020 No change in small left pleural effusion and adjacent Airspace disease, likely atelectasis. 02/11/2020 1. Well-positioned support structures. No pneumothorax. 2. Stable hazy bibasilar lung opacities, favor atelectasis. 3. Stable mild cardiomegaly without overt pulmonary edema.  02/12/2020 Increased bibasilar opacities question atelectasis versus infiltrate. Enlargement of cardiac silhouette with small LEFT pleural effusion.  DG Abd Portable 1V 02/23/2020 IMPRESSION:  Gastric catheter as described.   Transthoracic Echocardiogram  1. Left ventricular ejection fraction, by estimation, is 65 to 70%. The left ventricle has normal function. The left ventricle has no regional wall motion abnormalities. There is mild left ventricular hypertrophy. Left ventricular diastolic parameters are indeterminate.  2. Right ventricular systolic function is normal. The right ventricular size is normal. There is moderately elevated pulmonary artery systolic pressure.  3. The mitral valve is normal in  structure. Trivial mitral valve regurgitation.  4. Tricuspid valve regurgitation is moderate.  5. The aortic valve is tricuspid. Aortic valve regurgitation is trivial. No aortic stenosis is present.  LTM EEG  cortical dysfunction in left hemisphere likely secondary to underlying hemorrhage. Additionally, there is evidence of severe diffuse encephalopathy, non specific to etiology. No seizures or definite epileptiform discharges were seen throughout the recording. Event button was pressed on Mar 08, 2020 as described above without concomitant eeg change and was likely not epileptic.   PHYSICAL EXAM   Temp:  [99 F (37.2 C)-101.1 F (38.4 C)] 101.1 F (38.4 C) (04/20 0800) Pulse Rate:  [45-155] 71 (04/20 0915) Resp:  [7-28] 25 (04/20 0915) BP: (100-160)/(48-86) 108/58 (04/20 0915) SpO2:  [97 %-100 %] 100 % (  04/20 0915) FiO2 (%):  [40 %] 40 % (04/20 0740)  General - Well nourished, well developed, intubated on sedation.  Ophthalmologic - fundi not visualized due to noncooperation.  Cardiovascular - Regular rate and rhythm, not in afib anymore.  Neuro - intubated on fentanyl and propofol, eyes closed not open on voice or pain, not following commands. Eyes in mid position, not blinking to visual threat, doll's eyes sluggish, not tracking, pupil equal but sluggish to light. Corneal reflex absent bilaterally, gag and cough very weak. Breathing over the vent.  Facial symmetry not able to test due to ET tube.  Tongue protrusion not cooperative. On pain stimulation, left UE slight withdraw but no movement on other limbs. DTR 1+ and bilateral positive babinski. Sensation, coordination and gait not tested.   ASSESSMENT/PLAN [name] is a 64 y.o. female with history of Htn, Hld and estrogen therapy presenting with left gaze and right side flaccid. Vomited and was intubated  She did not receive IV t-PA due to ICH.  Stroke:  left MCA and bilateral ACA infarcts with large hematoma centered  within the left frontal lobe likely due to hemorrhagic conversion s/p crani for hematoma evacuation - stroke etiology likely due to PAF  CT Head 02/04/2020 - Very large acute parenchymal hemorrhage centered within the left frontal lobe measuring 8.2 x 6.5 x 5.6 cm. 5 mm rightward midline shift at the level of the septum pellucidum.   CT head - 02/18/2020 - Unchanged size of massive intraparenchymal hematoma centered in the left frontal lobe.  MRI head - unchanged hematoma and surrounding edema. DWI signal at left MCA and ACA consistent with acute infarcts. Scattered punctate acute infarcts in both cerebral hemispheres suggesting emboli  CTA H&N - No large vessel occlusion or high-grade arterial stenosis. No evidence of vascular malformation on the current exam.  CT repeat 4/19 - significantly decreased hematoma post evacuation  2D Echo - EF 65-70%   Sars Corona Virus 2 - negative - Ab positive  LDL - 74  HgbA1c - 5.6  UDS - negative  VTE prophylaxis - heparin subq  No antithrombotic prior to admission, now on No antithrombotic due to hemorrhage.  Therapy recommendations:  Re-order once pt stable for evaluation  Disposition:  Pending  Acute Respiratory Failure  Intubated for airway protection  On sedation   CCM on board  Wean as able   Family hope to avoid trach  Possible Aspiration Pneumonia Leukocytosis Fever   Nausea vomiting on presentation  Cefepime 4/16 >>  CCM on board  Temp - 100.5 -> 100.7->101.6 -> afebrile->101.1  WBCs - 11.6->14.2->14.7->17.4->19.5  CXR 4/20 - Increased bibasilar opacities question atelectasis versus infiltrate.  UA neg  Blood Cx pending    Cerebral edema  5 mm Rightward Midline Shift on CT and MRI  NSG on board s/p hematoma evacuation 4/17   On 3% at 75 cc/hr -> 50 cc -> NS@50  -> 0.45%@75    23.4% saline x1  Na - 139->142->146->157->159->161->160->159->158->156  Na every 6h  Atrial Fibrillation w/ RVR  New  diagnosis  Given her embolic strokes on MRI, pt likely has PAF before admission  On amio and dilt gtt - continue    Now back to [facility]  Cardiology consulted - appreciate help  May consider AC once blood absorbed in the future  UE shivering/shaking  Bilateral versus unilateral arm shaking/shivering  LTM EEG no seizure to correlate the shaking -> discontinued 4/19  Keppra - 500 mg Q 12 hrs after Keppra  2 g load  Seizure precautions  Hypertension  Home BP meds: Zestril 10 mg daily  off Cleviprex and normodyne (infusion)  On cardizem drip  Stable on the low end . SBP goal < 160 mm Hg  . Long-term BP goal normotensive  Hyperlipidemia  Home Lipid lowering medication: Pravachol 20 mg daily  LDL 74, goal < 70  Current lipid lowering medication: None for now  May resume statin at discharge vs. SATURN trial  Dysphagia At risk malnutrition   N.p.o.  On tube feeding at 25 cc/h  Speech following  Dietitian on board  Anemia, acute blood loss  Hb - 10.8->10.0->8.6->8.0->8.6   Likely due to post op  S/p PRBC in OR  Iron 16 and ferritin 66  CBC monitoring  Iron pills ordered.   Other Stroke Risk Factors  Advanced age  Morbid Obesity, Body mass index is 41.36 kg/m., recommend weight loss, diet and exercise as appropriate   On estrogen daily po PTA  Other Active Problems  Code status - Full code  Hypokalemia - treated - K 3.4->4.1->3.5->3.0->3.1->3.6   Hypophosphatemia - treated - Phos 1.7->2.2->1.8-> 2.3   Family requested COVID Ab testing after COVID vaccine - serology reactive  Hospital day # 4  This patient is critically ill due to large ICH, hemorrhagic conversion, hypertensive emergency, seizure, respiratory failure and at significant risk of neurological worsening, death form cerebral edema, brain herniation, hematoma expansion, recurrent stroke, epilepsy status. This patient's care requires constant monitoring of vital signs, hemodynamics,  respiratory and cardiac monitoring, review of multiple databases, neurological assessment, discussion with family, other specialists and medical decision making of high complexity. I spent 40 minutes of neurocritical care time in the care of this patient. I had long discussion with daughter at bedside, updated pt current condition, treatment plan and potential prognosis, and answered all the questions.  She expressed understanding and appreciation.   Rosalin Hawking, MD PhD Stroke Neurology 02/13/2020 9:24 AM   To contact Stroke Continuity provider, please refer to [email or web address] After hours, contact General Neurology

## 2020-02-13 NOTE — Progress Notes (Signed)
eLink Physician-Brief Progress Note Patient Name: Meredith Cooper DOB: 1956/09/15 MRN: [id number]   Date of Service  02/13/2020  HPI/Events of Note  AFIB with RVR - HR = 178 on Cardizem IV infusion at 15 mg/hour.  eICU Interventions  Will order: 1. Amiodarone IV load and infusion.      Intervention Category Major Interventions: Arrhythmia - evaluation and management  Marcia Lepera Dennard Nip 02/13/2020, 5:53 AM

## 2020-02-13 NOTE — Progress Notes (Signed)
CCMD paged and notified of patient uncontrolled BP despite PRN and heart rhythm going in and out of A-Fib and A-Flutter. RN will continue to monitor and await orders.

## 2020-02-13 NOTE — Progress Notes (Signed)
eLink Physician-Brief Progress Note Patient Name: Meredith Cooper DOB: [birth year] MRN: [id number]   Date of Service  02/13/2020  HPI/Events of Note  AFIB with RVR - Ventricular rate = 160's to 170's. BP = 151/73. K+ = 3.6, PO4--- = 2.3 and Creatinine = 0.76.  eICU Interventions  Will order: 1. Cardizem IV infusion. Titrate to HR = 65-105.  2. Replace K+ and PO4---.      Intervention Category Major Interventions: Arrhythmia - evaluation and management  Michale Weikel Eugene 02/13/2020, 5:09 AM

## 2020-02-13 NOTE — Progress Notes (Signed)
SLP Cancellation Note  Patient Details Name: Meredith Cooper MRN: [id number] DOB: 02-23-1956   Cancelled treatment:       Reason Eval/Treat Not Completed: Medical issues which prohibited therapy (remains on vent this am). Will continue to follow.    Mahala Menghini., M.A. CCC-SLP Acute Rehabilitation Services Pager [phone] Office [phone]  02/13/2020, 7:56 AM

## 2020-02-13 NOTE — Consult Note (Addendum)
Cardiology Consultation:   Patient ID: Meredith Cooper; [id number]; 12-04-1955   Admit date: 02/02/2020 Date of Consult: 02/13/2020  Primary Care Provider: Wayland Denis, PA-C Primary Cardiologist: No primary care provider on file. New Primary Electrophysiologist:  None   Patient Profile:   Meredith Cooper is a 64 y.o. female with a hx of HTN, HLD, hypothyroid, allergies, who is being seen today for the evaluation of Atrial fib, RVR, at the request of Dr Erlinda Hong.  History of Present Illness:   Meredith Cooper was admitted 04/16 as a code stroke. She initially required intubation for airway protection.   The CT showed a large L frontal intraparenchymal hemorrhagic, w/ 5 mm midline shift. She was started on hypertonic saline. She also had focal Sz activity, felt simple partial seizures 2nd ICH, treated w/ Keppra. However, no Sz on EEG  ABX were initiated due to vomiting, concern for aspiration.   Craniotomy 04/17 for hematoma evacuation. Follow up CTs w/ decreased hematoma.   Electrolytes were followed closely.  She was only on hypertonic saline until her sodium level was 160, then it was cut back.  Currently, she is on half-normal saline.  Her potassium has required supplementation this has been ordered.  She is on her home dose of Synthroid, TSH has not been checked.  Early this am, pt went into rapid atrial fib. HR as high as 200. Pt started on amio bolus/gtt and Cardizem IV, currently 12.5 mg/hr. after a couple of hours and 2 amiodarone boluses, she spontaneously converted to sinus rhythm and is currently in sinus rhythm at 60.  Patient is intubated and sedated.  Son and daughter are present.  She has no history of any cardiac evaluation.  She has never complained of palpitations, never complained that her heart was skipping or racing.  Her sister has atrial fibrillation, but the patient herself has no history of this.  She has no history of syncope or presyncope.  No history of any kind of  cardiac complaints.   Past Medical History:  Diagnosis Date  . AR (allergic rhinitis)   . GERD (gastroesophageal reflux disease)   . Hiatal hernia   . Hyperlipemia   . Hypertension   . Thyroid disease   . Vaginal prolapse     Past Surgical History:  Procedure Laterality Date  . ABDOMINAL HYSTERECTOMY  2000   [place] [facility]- [facility]  . BREAST BIOPSY    . VEIN LIGATION AND STRIPPING       Prior to Admission medications   Medication Sig Start Date End Date Taking? Authorizing Provider  estradiol (ESTRACE) 1 MG tablet Take 1 tablet (1 mg total) by mouth daily. 12/01/19  Yes Rubie Maid, MD  lisinopril (PRINIVIL,ZESTRIL) 10 MG tablet Take 10 mg by mouth daily.  12/31/17  Yes [provider]  meloxicam (MOBIC) 15 MG tablet Take 15 mg by mouth daily.   Yes [provider]  montelukast (SINGULAIR) 10 MG tablet Take 10 mg by mouth at bedtime.   Yes [provider]  omeprazole (PRILOSEC) 20 MG capsule Take 20 mg by mouth daily.   Yes [provider]  pravastatin (PRAVACHOL) 20 MG tablet Take 20 mg by mouth daily.   Yes [provider]  zolpidem (AMBIEN) 10 MG tablet Take 10 mg by mouth at bedtime as needed for sleep.   Yes [provider]  azelastine (ASTELIN) 0.1 % nasal spray Place 1 spray into both nostrils 2 (two) times daily.     [provider]  Bacillus Coagulans-Inulin (PROBIOTIC) 1-250 BILLION-MG CAPS Take 1 capsule by mouth daily.     [provider]  Cetirizine HCl 10 MG CAPS Take 1 capsule by mouth daily.     [provider]  conjugated estrogens (PREMARIN) vaginal cream Insert 1 g intravaginal weekly Patient not taking: Reported on 12/20/2019 12/17/18   Rubie Maid, MD  fluticasone [facility]) 50 MCG/ACT nasal spray Place 2 sprays into both nostrils daily.     [provider]  levocetirizine (XYZAL) 5 MG tablet Take 5 mg by mouth every evening.    [provider]  levothyroxine (SYNTHROID) 112  MCG tablet Take 112 mcg by mouth daily. 12/26/19   [provider]  Multiple Vitamins-Minerals (EQ VISION FORMULA 50+ PO) Take 1 tablet by mouth daily.     [provider]  omega-3 fish oil (MAXEPA) 1000 MG CAPS capsule Take 1 capsule by mouth daily.     [provider]  OXYQUINOLONE SULFATE VAGINAL (TRIMO-SAN) 0.025 % GEL Place 1 g vaginally once a week. Patient not taking: Reported on 12/20/2019 05/19/18   Defrancesco, Alanda Slim, MD  Resveratrol 100 MG CAPS Take 100 mg by mouth daily.     [provider]  TRIMO-SAN 0.025-0.01 % GEL Apply 1 application topically daily.  05/20/18   [provider]    Inpatient Medications: Scheduled Meds: . albuterol  2.5 mg Nebulization QID  . chlorhexidine gluconate (MEDLINE KIT)  15 mL Mouth Rinse BID  . Chlorhexidine Gluconate Cloth  6 each Topical Daily  . docusate  100 mg Per Tube BID  . feeding supplement (PRO-STAT SUGAR FREE 64)  60 mL Per Tube TID  . feeding supplement (VITAL HIGH PROTEIN)  1,000 mL Per Tube Q24H  . heparin injection (subcutaneous)  5,000 Units Subcutaneous Q8H  . insulin aspart  0-9 Units Subcutaneous Q4H  . levothyroxine  112 mcg Per Tube Q0600  . mouth rinse  15 mL Mouth Rinse 10 times per day  . pantoprazole sodium  40 mg Per Tube Daily  . senna-docusate  1 tablet Per Tube BID  . sodium chloride flush  10-40 mL Intracatheter Q12H   Continuous Infusions: . sodium chloride 75 mL/hr at 02/13/20 1200  . sodium chloride Stopped (02/13/20 0917)  . amiodarone 30 mg/hr (02/13/20 1200)  . ceFEPime (MAXIPIME) IV Stopped (02/13/20 0544)  . diltiazem (CARDIZEM) infusion 12.5 mg/hr (02/13/20 1200)  . fentaNYL infusion INTRAVENOUS 75 mcg/hr (02/13/20 1200)  . levETIRAcetam Stopped (02/13/20 1009)  . propofol (DIPRIVAN) infusion 10 mcg/kg/min (02/13/20 1200)   PRN Meds: sodium chloride, acetaminophen **OR** acetaminophen (TYLENOL) oral liquid 160 mg/5 mL **OR** acetaminophen, fentaNYL  (SUBLIMAZE) injection, hydrALAZINE, midazolam, sodium chloride flush  Allergies:    Allergies  Allergen Reactions  . Penicillins Itching and Rash    Social History:   Social History   Socioeconomic History  . Marital status: Married    Spouse name: Not on file  . Number of children: Not on file  . Years of education: Not on file  . Highest education level: Not on file  Occupational History  . Not on file  Tobacco Use  . Smoking status: Never Smoker  . Smokeless tobacco: Never Used  Substance and Sexual Activity  . Alcohol use: No  . Drug use: No  . Sexual activity: Not Currently    Birth control/protection: Surgical  Other Topics Concern  . Not on file  Social History Narrative  . Not on file   Social Determinants of Health  Financial Resource Strain:   . Difficulty of Paying Living Expenses:   Food Insecurity:   . Worried About Charity fundraiser in the Last Year:   . Arboriculturist in the Last Year:   Transportation Needs:   . Film/video editor (Medical):   Marland Kitchen Lack of Transportation (Non-Medical):   Physical Activity:   . Days of Exercise per Week:   . Minutes of Exercise per Session:   Stress:   . Feeling of Stress :   Social Connections:   . Frequency of Communication with Friends and Family:   . Frequency of Social Gatherings with Friends and Family:   . Attends Religious Services:   . Active Member of Clubs or Organizations:   . Attends Archivist Meetings:   Marland Kitchen Marital Status:   Intimate Partner Violence:   . Fear of Current or Ex-Partner:   . Emotionally Abused:   Marland Kitchen Physically Abused:   . Sexually Abused:     Family History:   Family History  Problem Relation Age of Onset  . Bone cancer Mother   . Bladder Cancer Sister   . Colon cancer Sister   . Atrial fibrillation Sister   . Diabetes Brother   . Ovarian cancer Maternal Grandmother   . Heart disease Father        had enlarged heart, died in his 87s  . Breast cancer Neg Hx     Family Status:  Family Status  Relation Name Status  . Mother  Deceased  . Sister  Alive  . Brother  (Not Specified)  . MGM  Deceased  . Father  Deceased  . Neg Hx  (Not Specified)    ROS:  Please see the history of present illness.  All other ROS reviewed and negative.     Physical Exam/Data:   Vitals:   02/13/20 1200 02/13/20 1215 02/13/20 1230 02/13/20 1245  BP: (!) 114/57 (!) 116/55 (!) 118/59 (!) 121/59  Pulse: 62 64 (!) 58 61  Resp: '11 12 14 13  ' Temp: 99.3 F (37.4 C)     TempSrc: Axillary     SpO2: 100% 100% 100% 100%  Weight:      Height:        Intake/Output Summary (Last 24 hours) at 02/13/2020 1330 Last data filed at 02/13/2020 1200 Gross per 24 hour  Intake 4517.64 ml  Output 1070 ml  Net 3447.64 ml    Last 3 Weights 02/12/2020 01/28/2020 12/20/2019  Weight (lbs) 240 lb 15.4 oz 240 lb 15.4 oz 238 lb 8 oz  Weight (kg) 109.3 kg 109.3 kg 108.183 kg     Body mass index is 41.36 kg/m.   General:  Well nourished, well developed, female intubated and sedated HEENT: normal Lymph: no adenopathy Neck: JVD -not seen elevated, difficult to assess secondary to lines and equipment Endocrine:  No thryomegaly Vascular: No carotid bruits; 4/4 extremity pulses 2+  Cardiac:  normal S1, S2; RRR; no murmur Lungs:  clear bilaterally, no wheezing, rhonchi or rales  Abd: soft, nontender, no hepatomegaly  Ext: no edema Musculoskeletal:  No deformities, BUE and BLE strength not able to be tested Skin: warm and dry  Neuro: Not able to be evaluated   EKG:  The EKG was personally reviewed and demonstrates: 1/20 ECG is atrial fibrillation, RVR, heart rate 131, nonspecific ST T wave flattening Telemetry:  Telemetry was personally reviewed and demonstrates: Atrial fib RVR>> sinus rhythm   CV studies:   ECHO:  02/11/2020 1. Left ventricular ejection fraction, by estimation, is 65 to 70%. The  left ventricle has normal function. The left ventricle has no regional  wall  motion abnormalities. There is mild left ventricular hypertrophy.  Left ventricular diastolic parameters are indeterminate.  2. Right ventricular systolic function is normal. The right ventricular  size is normal. There is moderately elevated pulmonary artery systolic  pressure.  3. The mitral valve is normal in structure. Trivial mitral valve  regurgitation.  4. Tricuspid valve regurgitation is moderate.  5. The aortic valve is tricuspid. Aortic valve regurgitation is trivial.  No aortic stenosis is present.   Laboratory Data:   Chemistry Recent Labs  Lab 02/12/20 0551 02/12/20 0551 02/12/20 0820 02/12/20 1425 02/12/20 2131 02/13/20 0347 02/13/20 0815  NA 160*   < > 159*   < > 159* 158* 156*  K 3.0*  --  3.1*  --   --  3.6  --   CL 127*  --  126*  --   --  127*  --   CO2 24  --  23  --   --  24  --   GLUCOSE 153*  --  130*  --   --  120*  --   BUN 29*  --  29*  --   --  35*  --   CREATININE 0.72  --  0.72  --   --  0.76  --   CALCIUM 8.5*  --  8.6*  --   --  8.5*  --   GFRNONAA >60  --  >60  --   --  >60  --   GFRAA >60  --  >60  --   --  >60  --   ANIONGAP 9  --  10  --   --  7  --    < > = values in this interval not displayed.    Lab Results  Component Value Date   ALT 19 02/02/2020   AST 25 02/17/2020   ALKPHOS 69 01/28/2020   BILITOT 0.7 01/28/2020   Hematology Recent Labs  Lab 02/11/20 0202 02/12/20 0551 02/13/20 0347  WBC 14.7* 17.4* 19.5*  RBC 3.35* 3.12* 3.33*  HGB 8.6* 8.0* 8.6*  HCT 29.2* 27.9* 29.5*  MCV 87.2 89.4 88.6  MCH 25.7* 25.6* 25.8*  MCHC 29.5* 28.7* 29.2*  RDW 15.3 16.0* 16.2*  PLT 235 213 254   Cardiac Enzymes High Sensitivity Troponin:  No results for input(s): TROPONINIHS in the last 720 hours.    BNPNo results for input(s): BNP, PROBNP in the last 168 hours.  DDimer No results for input(s): DDIMER in the last 168 hours.  Lipids: Lab Results  Component Value Date   CHOL 164 02/01/2020   HDL 55 02/22/2020   LDLCALC 74  01/29/2020   TRIG 114 02/13/2020   CHOLHDL 3.0 01/29/2020   HgbA1c: Lab Results  Component Value Date   HGBA1C 5.6 02/11/2020   Magnesium:  Magnesium  Date Value Ref Range Status  02/13/2020 2.1 1.7 - 2.4 mg/dL Final    Comment:    Performed at [facility], [place] [address] [place], [place] [address]   TSH No results found for: TSH   Radiology/Studies:  CT Code Stroke CTA Head W/WO contrast  Result Date: 02/14/2020 CLINICAL DATA:  Neuro deficit, acute, stroke suspected. Additional history provided: Unresponsive, left gaze, ri right-sided flaccid. EXAM: CT ANGIOGRAPHY HEAD AND NECK TECHNIQUE: Multidetector CT imaging of the head and neck  was performed using the standard protocol during bolus administration of intravenous contrast. Multiplanar CT image reconstructions and MIPs were obtained to evaluate the vascular anatomy. Carotid stenosis measurements (when applicable) are obtained utilizing NASCET criteria, using the distal internal carotid diameter as the denominator. CONTRAST:  27m OMNIPAQUE IOHEXOL 350 MG/ML SOLN COMPARISON:  Concurrently performed non-contrast head CT. FINDINGS: CTA NECK FINDINGS Aortic arch: Standard aortic branching. The visualized aortic arch is unremarkable. No significant innominate or proximal subclavian artery stenosis. Right carotid system: CCA and ICA patent within the neck without stenosis. Left carotid system: CCA and ICA patent within the neck without stenosis. Vertebral arteries: The vertebral arteries are codominant and patent within the neck without significant stenosis. Skeleton: No acute bony abnormality or aggressive osseous lesion. Cervical spondylosis without high-grade bony spinal canal narrowing. Other neck: No neck mass or cervical lymphadenopathy. Upper chest: No consolidation within the imaged lung apices. Review of the MIP images confirms the above findings CTA HEAD FINDINGS Anterior circulation: The intracranial internal carotid arteries  are patent without significant stenosis. The M1 middle cerebral arteries are patent without significant stenosis. No M2 proximal branch occlusion or high-grade proximal stenosis is identified. The anterior cerebral arteries are patent without significant proximal stenosis. No intracranial aneurysm is identified. There is no evidence of vascular malformation on the current exam. No contrast blush is seen on arterial phase imaging to suggest active arterial extravasation at site of the acute left frontal lobe parenchymal hematoma. Posterior circulation: The intracranial vertebral arteries are patent without significant stenosis, as is the basilar artery. The bilateral posterior cerebral arteries are patent without significant proximal stenosis. Posterior communicating arteries are poorly delineated and may be hypoplastic or absent bilaterally. Venous sinuses: Within limitations of contrast timing, no convincing thrombus. Anatomic variants: As described. Review of the MIP images confirms the above findings These results were communicated to Dr. SLeonie ManAt 3:58 pmon 04/12/2021by text page via the [facility]. IMPRESSION: CTA neck: The bilateral common carotid, internal carotid and vertebral arteries are patent within the neck without significant stenosis. CTA head: 1. No intracranial large vessel occlusion or proximal high-grade arterial stenosis. 2. No evidence of vascular malformation on the current exam. However, consider imaging follow-up once the hematoma involutes for further assessment. 3. No contrast blush is demonstrated in the region of the acute left frontal lobe hematoma on arterial phase imaging to suggest active extravasation. 4. No intracranial aneurysm is identified. Electronically Signed   By: KKellie SimmeringDO   On: 02/18/2020 15:59   CT HEAD WO CONTRAST  Result Date: 02/12/2020 CLINICAL DATA:  Intracranial hemorrhage. Status post craniotomy. EXAM: CT HEAD WITHOUT CONTRAST TECHNIQUE:  Contiguous axial images were obtained from the base of the skull through the vertex without intravenous contrast. COMPARISON:  CT head without contrast/17/21. MR head without contrast 01/31/2020. FINDINGS: Brain: Patient is now status post left frontal parietal craniotomy for evacuation of the hemorrhage. Hemorrhage is significantly debulked. Gas is present within the operative cavity. Morselized Gel-Foam is present within the surgical cavity. No new hemorrhage is present. Minimal pneumocephalus remains. Midline shift is reduced to 4 mm. Blood products are again noted within the posterior horn of the lateral ventricles without hydrocephalus. Vascular: Atherosclerotic calcifications are present within the cavernous internal carotid arteries. No hyperdense vessel is present. Skull: Insert normal skull postoperative changes are present in the left scalp. Sinuses/Orbits: Minimal fluid is present in the left sphenoid sinus. The paranasal sinuses and mastoid air cells are otherwise clear. The globes and orbits  are within normal limits. IMPRESSION: 1. Status post left frontal parietal craniotomy for evacuation of the hemorrhage. 2. Hemorrhage is significantly debulked. Morselized Gel-Foam is present within the surgical cavity. 3. No new hemorrhage. 4. Improved midline shift, now measuring 4 mm. 5. Stable intraventricular hemorrhage without hydrocephalus. Electronically Signed   By: San Morelle M.D.   On: 02/12/2020 11:10   CT HEAD WO CONTRAST  Result Date: 02/12/2020 CLINICAL DATA:  Hemorrhage follow-up EXAM: CT HEAD WITHOUT CONTRAST TECHNIQUE: Contiguous axial images were obtained from the base of the skull through the vertex without intravenous contrast. COMPARISON:  Head CT 02/17/2020 FINDINGS: Brain: Massive intraparenchymal hematoma centered in the left frontal lobe is unchanged in size. There is an increased amount of blood in the occipital horn of the right lateral ventricle. Rightward midline shift  measures 5 mm at the level of the foramina of [place]. Vascular: No abnormal hyperdensity of the major intracranial arteries or dural venous sinuses. No intracranial atherosclerosis. Skull: The visualized skull base, calvarium and extracranial soft tissues are normal. Sinuses/Orbits: No fluid levels or advanced mucosal thickening of the visualized paranasal sinuses. No mastoid or middle ear effusion. The orbits are normal. IMPRESSION: 1. Unchanged size of massive intraparenchymal hematoma centered in the left frontal lobe. 2. Increased amount of blood in the occipital horn of the right lateral ventricle. 3. Unchanged rightward midline shift, measuring 5 mm. Electronically Signed   By: Ulyses Jarred M.D.   On: 01/25/2020 06:32   CT Code Stroke CTA Neck W/WO contrast  Result Date: 01/27/2020 CLINICAL DATA:  Neuro deficit, acute, stroke suspected. Additional history provided: Unresponsive, left gaze, ri right-sided flaccid. EXAM: CT ANGIOGRAPHY HEAD AND NECK TECHNIQUE: Multidetector CT imaging of the head and neck was performed using the standard protocol during bolus administration of intravenous contrast. Multiplanar CT image reconstructions and MIPs were obtained to evaluate the vascular anatomy. Carotid stenosis measurements (when applicable) are obtained utilizing NASCET criteria, using the distal internal carotid diameter as the denominator. CONTRAST:  73m OMNIPAQUE IOHEXOL 350 MG/ML SOLN COMPARISON:  Concurrently performed non-contrast head CT. FINDINGS: CTA NECK FINDINGS Aortic arch: Standard aortic branching. The visualized aortic arch is unremarkable. No significant innominate or proximal subclavian artery stenosis. Right carotid system: CCA and ICA patent within the neck without stenosis. Left carotid system: CCA and ICA patent within the neck without stenosis. Vertebral arteries: The vertebral arteries are codominant and patent within the neck without significant stenosis. Skeleton: No acute bony  abnormality or aggressive osseous lesion. Cervical spondylosis without high-grade bony spinal canal narrowing. Other neck: No neck mass or cervical lymphadenopathy. Upper chest: No consolidation within the imaged lung apices. Review of the MIP images confirms the above findings CTA HEAD FINDINGS Anterior circulation: The intracranial internal carotid arteries are patent without significant stenosis. The M1 middle cerebral arteries are patent without significant stenosis. No M2 proximal branch occlusion or high-grade proximal stenosis is identified. The anterior cerebral arteries are patent without significant proximal stenosis. No intracranial aneurysm is identified. There is no evidence of vascular malformation on the current exam. No contrast blush is seen on arterial phase imaging to suggest active arterial extravasation at site of the acute left frontal lobe parenchymal hematoma. Posterior circulation: The intracranial vertebral arteries are patent without significant stenosis, as is the basilar artery. The bilateral posterior cerebral arteries are patent without significant proximal stenosis. Posterior communicating arteries are poorly delineated and may be hypoplastic or absent bilaterally. Venous sinuses: Within limitations of contrast timing, no convincing thrombus. Anatomic variants:  As described. Review of the MIP images confirms the above findings These results were communicated to Dr. Leonie Man At 3:58 pmon 04/02/2021by text page via the [facility] messaging system. IMPRESSION: CTA neck: The bilateral common carotid, internal carotid and vertebral arteries are patent within the neck without significant stenosis. CTA head: 1. No intracranial large vessel occlusion or proximal high-grade arterial stenosis. 2. No evidence of vascular malformation on the current exam. However, consider imaging follow-up once the hematoma involutes for further assessment. 3. No contrast blush is demonstrated in the region of the acute  left frontal lobe hematoma on arterial phase imaging to suggest active extravasation. 4. No intracranial aneurysm is identified. Electronically Signed   By: Kellie Simmering DO   On: 02/20/2020 15:59   MR BRAIN WO CONTRAST  Result Date: 01/31/2020 CLINICAL DATA:  Cerebral hemorrhage. EXAM: MRI HEAD WITHOUT CONTRAST TECHNIQUE: Multiplanar, multiecho pulse sequences of the brain and surrounding structures were obtained without intravenous contrast. COMPARISON:  Head CT 02/18/2020 FINDINGS: Brain: A large left frontoparietal parenchymal hematoma has not significantly changed in size, measuring 9.1 x 4.9 cm. There is mild surrounding edema with regional sulcal effacement unchanged rightward midline shift measuring 6 mm. There is a small amount of restricted diffusion surrounding the hematoma which is greatest inferiorly. The hematoma is again noted to extend into the left lateral ventricle, and there is small volume hemorrhage throughout the ventricles. The ventricles are unchanged in size from today's earlier CT. There are scattered punctate acute infarcts in both cerebral hemispheres, primarily cortical in location. FLAIR hyperintensity involving cerebral sulci bilaterally may reflect a combination of subarachnoid hemorrhage and hyperoxygenation related to mechanical ventilation. There is no extra-axial fluid collection. Vascular: Major intracranial vascular flow voids are preserved. Skull and upper cervical spine: Unremarkable bone marrow signal. Sinuses/Orbits: Unremarkable orbits. Clear mastoid air cells. Minimal bilateral ethmoid air cell mucosal thickening. Other: None. IMPRESSION: 1. Unchanged large left frontoparietal parenchymal hematoma with unchanged rightward midline shift. Mild surrounding edema and restricted diffusion consistent with associated acute infarct. 2. Small volume intraventricular and subarachnoid hemorrhage. 3. Scattered punctate acute infarcts in both cerebral hemispheres suggesting emboli.  Electronically Signed   By: Logan Bores M.D.   On: 02/13/2020 11:32   DG CHEST PORT 1 VIEW  Result Date: 02/13/2020 CLINICAL DATA:  Fever EXAM: PORTABLE CHEST 1 VIEW COMPARISON:  Portable exam 1014 hours compared to 02/11/2020 FINDINGS: Tip of endotracheal tube projects 3.0 cm above carina. Nasogastric tube coiled in stomach with tip at fundus. RIGHT jugular line with tip projecting over SVC. Enlargement of cardiac silhouette. Increased opacity at the lung bases question progressive atelectasis or infiltrate. Upper lungs clear. Small LEFT pleural effusion. No pneumothorax. IMPRESSION: Increased bibasilar opacities question atelectasis versus infiltrate. Enlargement of cardiac silhouette with small LEFT pleural effusion. Electronically Signed   By: Lavonia Dana M.D.   On: 02/13/2020 12:04   DG CHEST PORT 1 VIEW  Result Date: 02/11/2020 CLINICAL DATA:  Code stroke, intubated EXAM: PORTABLE CHEST 1 VIEW COMPARISON:  Chest radiograph from one day prior. FINDINGS: Endotracheal tube tip is 2.8 cm above the carina. Right internal jugular central venous catheter terminates in the lower third of the SVC. Enteric tube loops in the body of the stomach and terminates in the gastric fundus. Stable cardiomediastinal silhouette with mild cardiomegaly. No pneumothorax. No pleural effusion. Slightly low lung volumes. Hazy bibasilar lung opacities appear similar. No overt pulmonary edema. IMPRESSION: 1. Well-positioned support structures. No pneumothorax. 2. Stable hazy bibasilar lung opacities, favor atelectasis. 3.  Stable mild cardiomegaly without overt pulmonary edema. Electronically Signed   By: Ilona Sorrel M.D.   On: 02/11/2020 10:37   Portable Chest xray  Result Date: 01/26/2020 CLINICAL DATA:  Code stroke.  Respiratory distress. EXAM: PORTABLE CHEST 1 VIEW COMPARISON:  02/03/2020 FINDINGS: Endotracheal tube terminates 3.8 cm above carina. Nasogastric tube is looped in the stomach with tip at gastric body. Right  internal jugular line tip at low SVC. Mild cardiomegaly. Probable small left pleural effusion. No pneumothorax. Similar left base airspace disease. IMPRESSION: No change in small left pleural effusion and adjacent Airspace disease, likely atelectasis. Electronically Signed   By: Abigail Miyamoto M.D.   On: 01/25/2020 10:21   DG Chest Port 1 View  Result Date: 02/08/2020 CLINICAL DATA:  Check central line placement EXAM: PORTABLE CHEST 1 VIEW COMPARISON:  Film from earlier in the same day. FINDINGS: Endotracheal tube and gastric catheter are noted. Right jugular central line is now seen with the catheter tip at the cavoatrial junction. No pneumothorax is seen. Left basilar atelectasis is now seen. No bony abnormality is noted. IMPRESSION: Increasing left basilar atelectasis. Central line as described without pneumothorax. Electronically Signed   By: Inez Catalina M.D.   On: 02/06/2020 19:28   DG Chest Portable 1 View  Result Date: 01/29/2020 CLINICAL DATA:  Stroke-like symptoms EXAM: PORTABLE CHEST 1 VIEW COMPARISON:  None. FINDINGS: Cardiac shadow is mildly enlarged but accentuated by the portable technique. Endotracheal tube and gastric catheter are seen. Gastric catheter is noted in the distal esophagus and should be advanced several cm further into the stomach. The overall inspiratory effort is poor with crowding of the vascular markings. Mild interstitial edema cannot be totally excluded. IMPRESSION: Tubes and lines as described above. The gastric catheter lies in the distal esophagus and should be advanced several cm further into the stomach. Increased interstitial markings likely related to the poor inspiratory effort however mild edema cannot be excluded. Electronically Signed   By: Inez Catalina M.D.   On: 02/21/2020 16:33   DG Abd Portable 1V  Result Date: 02/18/2020 CLINICAL DATA:  Check gastric catheter placement EXAM: PORTABLE ABDOMEN - 1 VIEW COMPARISON:  None. FINDINGS: Gastric catheter is noted  looped within the stomach. The tip projects into the fundus. No obstructive changes are seen. No free air is noted. IMPRESSION: Gastric catheter as described. Electronically Signed   By: Inez Catalina M.D.   On: 02/20/2020 19:27   ECHOCARDIOGRAM COMPLETE  Result Date: 02/18/2020    ECHOCARDIOGRAM REPORT   Patient Name:   JOEANNE ROBICHEAUX Date of Exam: 02/05/2020 Medical Rec #:  [id number]   Height:       64.0 in Accession #:    [id number]  Weight:       241.0 lb Date of Birth:  [birth year]   BSA:          2.118 m Patient Age:    13 years    BP:           148/51 mmHg Patient Gender: F           HR:           83 bpm. Exam Location:  Inpatient Procedure: 2D Echo, Cardiac Doppler, Color Doppler and Intracardiac            Opacification Agent Indications:    Stroke 434.91/I163.9  History:        Patient has no prior history of Echocardiogram examinations.  Risk Factors:Hypertension and Dyslipidemia.  Sonographer:    Clayton Lefort RDCS (AE) Referring Phys: [id number] [facility]  Sonographer Comments: Suboptimal apical window, echo performed with patient supine and on artificial respirator and patient is morbidly obese. Image acquisition challenging due to patient body habitus. IMPRESSIONS  1. Left ventricular ejection fraction, by estimation, is 65 to 70%. The left ventricle has normal function. The left ventricle has no regional wall motion abnormalities. There is mild left ventricular hypertrophy. Left ventricular diastolic parameters are indeterminate.  2. Right ventricular systolic function is normal. The right ventricular size is normal. There is moderately elevated pulmonary artery systolic pressure.  3. The mitral valve is normal in structure. Trivial mitral valve regurgitation.  4. Tricuspid valve regurgitation is moderate.  5. The aortic valve is tricuspid. Aortic valve regurgitation is trivial. No aortic stenosis is present. FINDINGS  Left Ventricle: Left ventricular ejection fraction, by estimation, is 65  to 70%. The left ventricle has normal function. The left ventricle has no regional wall motion abnormalities. Definity contrast agent was given IV to delineate the left ventricular  endocardial borders. The left ventricular internal cavity size was normal in size. There is mild left ventricular hypertrophy. Left ventricular diastolic parameters are indeterminate. Right Ventricle: The right ventricular size is normal. No increase in right ventricular wall thickness. Right ventricular systolic function is normal. There is moderately elevated pulmonary artery systolic pressure. The tricuspid regurgitant velocity is 3.18 m/s, and with an assumed right atrial pressure of 10 mmHg, the estimated right ventricular systolic pressure is 77.1 mmHg. Left Atrium: Left atrial size was normal in size. Right Atrium: Right atrial size was normal in size. Pericardium: Trivial pericardial effusion is present. Mitral Valve: The mitral valve is normal in structure. Trivial mitral valve regurgitation. MV peak gradient, 6.2 mmHg. The mean mitral valve gradient is 2.0 mmHg. Tricuspid Valve: The tricuspid valve is normal in structure. Tricuspid valve regurgitation is moderate. Aortic Valve: The aortic valve is tricuspid. Aortic valve regurgitation is trivial. No aortic stenosis is present. Aortic valve mean gradient measures 9.0 mmHg. Aortic valve peak gradient measures 19.7 mmHg. Aortic valve area, by VTI measures 2.40 cm. Pulmonic Valve: The pulmonic valve was not well visualized. Pulmonic valve regurgitation is not visualized. Aorta: The aortic root and ascending aorta are structurally normal, with no evidence of dilitation. IAS/Shunts: The interatrial septum was not well visualized.  LEFT VENTRICLE PLAX 2D LVIDd:         5.10 cm  Diastology LVIDs:         2.90 cm  LV e' lateral:   12.40 cm/s LV PW:         1.30 cm  LV E/e' lateral: 12.1 LV IVS:        1.30 cm  LV e' medial:    8.81 cm/s LVOT diam:     2.00 cm  LV E/e' medial:  17.0 LV  SV:         106 LV SV Index:   50 LVOT Area:     3.14 cm  RIGHT VENTRICLE             IVC RV Basal diam:  3.50 cm     IVC diam: 2.40 cm RV Mid diam:    3.20 cm RV S prime:     20.70 cm/s TAPSE (M-mode): 2.5 cm LEFT ATRIUM             Index       RIGHT ATRIUM  Index LA diam:        4.90 cm 2.31 cm/m  RA Area:     17.60 cm LA Vol (A2C):   56.8 ml 26.82 ml/m RA Volume:   45.70 ml  21.58 ml/m LA Vol (A4C):   67.5 ml 31.88 ml/m LA Biplane Vol: 64.3 ml 30.36 ml/m  AORTIC VALVE AV Area (Vmax):    2.45 cm AV Area (Vmean):   2.36 cm AV Area (VTI):     2.40 cm AV Vmax:           222.00 cm/s AV Vmean:          136.000 cm/s AV VTI:            0.442 m AV Peak Grad:      19.7 mmHg AV Mean Grad:      9.0 mmHg LVOT Vmax:         173.00 cm/s LVOT Vmean:        102.000 cm/s LVOT VTI:          0.338 m LVOT/AV VTI ratio: 0.76  AORTA Ao Root diam: 3.30 cm Ao Asc diam:  3.10 cm MITRAL VALVE                TRICUSPID VALVE MV Area (PHT): 3.85 cm     TR Peak grad:   40.4 mmHg MV Peak grad:  6.2 mmHg     TR Vmax:        318.00 cm/s MV Mean grad:  2.0 mmHg MV Vmax:       1.24 m/s     SHUNTS MV Vmean:      63.0 cm/s    Systemic VTI:  0.34 m MV Decel Time: 197 msec     Systemic Diam: 2.00 cm MV E velocity: 150.00 cm/s MV A velocity: 86.50 cm/s MV E/A ratio:  1.73 Oswaldo Milian MD Electronically signed by Oswaldo Milian MD Signature Date/Time: 02/21/2020/5:05:54 PM    Final    CT HEAD CODE STROKE WO CONTRAST  Addendum Date: 02/18/2020   ADDENDUM REPORT: 02/08/2020 20:26 ADDENDUM: Attempted to reach primary team via text page at the time of interpretation. Per EMR, primary team aware of imaging findings. Electronically Signed   By: Kellie Simmering DO   On: 01/28/2020 20:26   Result Date: 01/31/2020 CLINICAL DATA:  Code stroke. Neuro deficit, acute, stroke suspected. Additional history provided: Unresponsive, left gaze, right-sided flaccid. EXAM: CT HEAD WITHOUT CONTRAST TECHNIQUE: Contiguous axial images were  obtained from the base of the skull through the vertex without intravenous contrast. COMPARISON:  No pertinent prior studies available for comparison. FINDINGS: Brain: There is a very large acute parenchymal hemorrhage centered within the left frontal lobe which measures 8.2 x 6.5 x 5.6 cm. There is surrounding parenchymal edema. There is mild intraventricular extension into the left foramen of Monro, third ventricle and cerebral aqueduct. Associated mass effect with significant partial effacement of the left lateral ventricle. 5 mm rightward midline shift measured at the level of the septum pellucidum. No hydrocephalus or evidence of ventricular entrapment on the current exam. Elsewhere the brain appears unremarkable. There is no extra-axial fluid collection. Vascular: No hyperdense vessel. Skull: Normal. Negative for fracture or focal lesion. Sinuses/Orbits: Visualized orbits demonstrate no acute abnormality. Mild ethmoid sinus mucosal thickening. No significant mastoid effusion. IMPRESSION: Very large acute parenchymal hemorrhage centered within the left frontal lobe measuring 8.2 x 6.5 x 5.6 cm. Surrounding parenchymal edema. Mild intraventricular extension of hemorrhage into the left foramen  of Monro, third ventricle and cerebral aqueduct. Associated mass effect with partial effacement of the left lateral ventricle. 5 mm rightward midline shift at the level of the septum pellucidum. Electronically Signed: By: Kellie Simmering DO On: 02/19/2020 15:40    Assessment and Plan:   1.  PAF, RVR -She had scattered punctate infarcts in both cerebral hemispheres on MRI of the head, suggesting emboli -Atrial fib may have been the cause of the original stroke -No history of A. fib and no history of symptoms suggesting A. fib although her sister has a history of A. fib -IV Cardizem is currently at 12.5 mg an hour, suspect we could change that to p.o. at 60 mg every 6 hours per tube and use as needed doses of IV  Cardizem or metoprolol should she have more atrial fibrillation -Would continue amiodarone IV for now, possible change to p.o. in a.m. if she remains in sinus rhythm - CHA2DS2-VASc is 5 (age x 1, female, HTN, CVA x 2) -Currently, she is on DVT Heparin at 5000 units 3 times daily - because of the ICH, defer anticoagulation to Neurology -DC amlodipine while on Cardizem  2.  CVA: -8.2 x 6.5 x 5.6 cm intracerebral hematoma with 5 mm midline shift, much improved after craniotomy and evacuation. -Midline shift is now 4 mm -They have been able to wean her sedation, but is still on fentanyl and Precedex -No clear seizure activity on EEG  Otherwise, Per IM/CCM/Neuro Active Problems:   Essential hypertension   Stroke ([place])   Acute respiratory failure with hypoxemia ([place])   Acute blood loss anemia  For questions or updates, please contact [facility] Please consult [email or web address] for contact info under Cardiology/STEMI.   Signed, Ena Dawley, MD  02/13/2020 1:30 PM   The patient was seen, examined and discussed with Rosaria Ferries, PA-C and I agree with the above.   64 y.o. female with a hx of HTN, HLD, hypothyroid, allergies, who is being seen today for the evaluation of Atrial fib, RVR. Admitted on 4/16 as a code stroke. She initially required intubation for airway protection.  The CT showed a large L frontal intraparenchymal hemorrhagic, w/ 5 mm midline shift. She was started on hypertonic saline. She also had focal Sz activity, felt simple partial seizures 2nd ICH, treated w/ Keppra. However, no Sz on EEG. Craniotomy 04/17 for hematoma evacuation. Follow up CTs w/ decreased hematoma.  She was only on hypertonic saline until her sodium level was 160, then it was cut back.  The patient is still intubated and sedated. She developed atrial fibrillation with RVR with heart rates up to 200 this morning.  She cardioverted to sinus rhythm at 8 AM.  Per family members she has never complained of  palpitations in the past.  She has no prior cardiac history.  She was started on amiodarone bolus and drip as well as Cardizem drip currently at 12.5 mg/h.  Her heart rate is currently in 60s.  Blood pressure  Her echocardiogram shows LVEF 65 to 70%, mild concentric LVH, normal RV function, trivial mitral regurgitation, moderate tricuspid regurgitation, normal left atrial size.  CHA2DS2-VASc Score = 4 The patient's score is based upon: CHF History: No HTN History: No Age : 62-74 Diabetes History: No Stroke History: Yes Vascular Disease History: No Gender: Female   I would continue amiodarone drip, and Cardizem drip, she will certainly require systemic anticoagulation, I would appreciate comment of neurology about when it would be safe to start anticoagulation giving  intracranial bleed.  For now we will try to keep her out of atrial fibrillation.  She does not seem to be fluid overloaded on physical exam.  We will follow.  Thank you for the consultation.  Signed,  Ena Dawley, MD    02/13/2020 1:30 PM

## 2020-02-13 NOTE — Progress Notes (Signed)
PT Cancellation Note  Patient Details Name: Meredith Cooper MRN: [id number] DOB: [birth year]   Cancelled Treatment:    Reason Eval/Treat Not Completed: Patient not medically ready. Pt's HR remains uncontrolled despite multiple medicines. D/C consult. Please re-consult if/when patient medically stable and appropriate to advance mobility.  Lewis Shock, PT, DPT Acute Rehabilitation Services Pager #: [phone] Office #: [phone]    Iona Hansen 02/13/2020, 8:00 AM

## 2020-02-13 NOTE — Progress Notes (Signed)
eLink Physician-Brief Progress Note Patient Name: Meredith Cooper DOB: 1956-10-10 MRN: [id number]   Date of Service  02/13/2020  HPI/Events of Note  Hypertension - BP = 164/57 with MAP = 86. Currently on Hydralazine IV PRN. Appears to be almost 10 liters positive since admission.   eICU Interventions  Will order: 1. Laxix 40 mg IV X 1 now.      Intervention Category Major Interventions: Hypertension - evaluation and management  Jathan Balling [place] 02/13/2020, 11:44 PM

## 2020-02-13 NOTE — Progress Notes (Addendum)
NAME:  Meredith Cooper, MRN:  [id number], DOB:  12/06/1955, LOS: 4 ADMISSION DATE:  02/20/2020, CONSULTATION DATE:  02/16/2020 REFERRING MD:  Leonie Man - neuro, CHIEF COMPLAINT:  Code stroke - L gaze deviation, R sided weakness   Brief History   8 yowf never smoker  presented to [facility] ED 4/16 as code stroke with sudden onset R sided weakness, L gaze deviation. Rapid deterioration in ED; patient vomited multiple times in CT, then required intubation for airway protection. CTH with large acute parenchymal hemorrhage in L frontal lobe, 8.2 x 6.5 x 5.6 cm, 23mm midline shift.   Past Medical History  HTN HLD Hiatal hernia GERD S/p hysterectomy   Significant Hospital Events   4/16 Code stroke, large ICH, Intubated.  4/17 overnight possible seizure activity, received ativan and started Keppra. Max on cleviprex, added PRN hydralazine for SBP goal <140 4/17  no sz 4/17 pm craniotomy for hematoma evacuation  4/18 AFRVR  Consults:  [facility]  Stroke team   Procedures:  4/16 ETT >  4/16 CVC>> 4/16 Arterial line>>  4/17 Craniotomy/ no drain  Significant Diagnostic Tests:  4/16 CT H> Large acute intraparenchymal hemorrhage centered in L frontal lobe 8.2 x 6.5 x 5.6 cm, 33mm midline shift.  4/16 CTA H> no aneurysm  4/17 CT H> unchanged size of massive IPH centered in L frontal lobe. Interval increase in amount of blood in R lateral ventricle occipital horn. 75mm midline shift  4/17 MRI Brain >>  1. Unchanged large left frontoparietal parenchymal hematoma with unchanged rightward midline shift. Mild surrounding edema and restricted diffusion consistent with associated acute infarct. 2. Small volume intraventricular and subarachnoid hemorrhage. 3. Scattered punctate acute infarcts in both cerebral hemispheres suggesting emboli. - echo 4/17 with mod PAS elevation/ mild LVH 4/19 CT Head - s/p left frontal parietal craniotomy. No new hemorrhage. Improved midline shift, current 78mm. Stable IVH without  hydrocephalus 4/19 EEG -cortical dysfunction in left hemisphere likely secondary to underlying hemorrhage. Additionally, there is evidence of severe diffuse encephalopathy, non specific to etiology.No seizures ordefiniteepileptiform discharges were seen throughout the recording.  Micro Data:  4/16 SARS Cov2   neg  4/16  MRSA PCR  neg  Respiratory culture 4/18-rare GPC's   Antimicrobials:  4/16 cefepime>> added for possible aspiration event  4/17 Vanc  X one dose in OR   Interim history/subjective:   AFRVR overnight. Started on dilt and amio gtt.  Objective   Blood pressure (!) 124/48, pulse (!) 115, temperature 100.2 F (37.9 C), temperature source Oral, resp. rate 18, height 5\' 4"  (1.626 m), weight 109.3 kg, SpO2 100 %.    Vent Mode: PRVC FiO2 (%):  [40 %] 40 % Set Rate:  [20 bmp] 20 bmp Vt Set:  [440 mL] 440 mL PEEP:  [5 cmH20] 5 cmH20 Plateau Pressure:  [14 cmH20-16 cmH20] 14 cmH20   Intake/Output Summary (Last 24 hours) at 02/13/2020 0735 Last data filed at 02/13/2020 0700 Gross per 24 hour  Intake 3860.38 ml  Output 980 ml  Net 2880.38 ml   Filed Weights   02/18/2020 1500 02/20/2020 1559  Weight: 109.3 kg 109.3 kg   Physical Exam: General: Chronically ill-appearing, sedated HENT: [place], AT, ETT in place Eyes: EOMI, no scleral icterus Respiratory: Coarse breath sounds bilaterally Cardiovascular: RRR, -M/R/G, no JVD GI: BS+, soft, nontender Extremities:1+ pitting edema on lower extremities,-tenderness Neuro: Sedated  CBC Latest Ref Rng & Units 02/13/2020 02/12/2020 02/11/2020  WBC 4.0 - 10.5 K/uL 19.5(H) 17.4(H) 14.7(H)  Hemoglobin 12.0 -  15.0 g/dL 7.0(J) 5.0(K) 9.3(G)  Hematocrit 36.0 - 46.0 % 29.5(L) 27.9(L) 29.2(L)  Platelets 150 - 400 K/uL 254 213 235    BMP Latest Ref Rng & Units 02/13/2020 02/12/2020 02/12/2020  Glucose 70 - 99 mg/dL 182(X) - -  BUN 8 - 23 mg/dL 93(Z) - -  Creatinine 1.69 - 1.00 mg/dL 6.78 - -  Sodium 938 - 145 mmol/L 158(H) 159(H) 158(H)   Potassium 3.5 - 5.1 mmol/L 3.6 - -  Chloride 98 - 111 mmol/L 127(H) - -  CO2 22 - 32 mmol/L 24 - -  Calcium 8.9 - 10.3 mg/dL 1.0(F) - -   Resolved Hospital Problem list     Assessment & Plan:   Acute hypoxic respiratory failure with failure to protect airway, likely aspiration event, in setting of intraparenchymal hemorrhage  -intubated in ED with failed airway protection after vomiting P Full vent support SBT's when mental status tolerates Follow-up respiratory cultures VAP Follow intermittent chest x-ray  Large ICH with cytotoxic cerebral edema  Possible seizure activity  -s/p craniotomy pm 4/17 s drain -S/p 3% saline P Per Neuro and NSG management: On1/2 NS Goal SBP <160 Continue Keppra BID SCD only   AFRVR - likely due to acute illness, difficult to control HTN - echo 4/17 with mod PAS elevation/ mild LVH P Telemetry On amio and dilt gtt Give additional amiodarone bolus now Consult Cardiology Continue amlodipine Hold lisinopril  Hypokalemia Hypophosphatemia  P Replete electrolytes  Leukocytosis with possible HCAP/ LLL   P Continue cefepime. Can stop after 5 days Follow-up respiratory cultures  Hyperglycemia P SSI  Mild acute blood loss anemia  -post craniotomy 4/17  >>> theshold to transfuse at < 7 gm hgb  At risk malnutrition P Continue tube feeds   Best practice:  Diet: EN per RDN  Pain/Anxiety/Delirium protocol (if indicated): RASS goal 0, PRN fentanyl PRN versed Propofol gtt available  VAP protocol (if indicated): yes DVT prophylaxis: SCD only  GI prophylaxis: protonix  Glucose control: SSI Mobility: BR Code Status: Full  Family Communication: Updated husband via phone 4/20. Disposition: ICU    The patient is critically ill with multiple organ systems failure and requires high complexity decision making for assessment and support, frequent evaluation and titration of therapies, application of advanced monitoring technologies and  extensive interpretation of multiple databases.   Critical Care Time devoted to patient care services described in this note is 36 Minutes.   Mechele Collin, M.D. [facility] Pulmonary/Critical Care Medicine 02/13/2020 7:35 AM   Please see Amion for pager number to reach on-call Pulmonary and Critical Care Team.

## 2020-02-14 ENCOUNTER — Inpatient Hospital Stay (HOSPITAL_COMMUNITY): Payer: Medicare Other

## 2020-02-14 DIAGNOSIS — Z978 Presence of other specified devices: Secondary | ICD-10-CM | POA: Diagnosis not present

## 2020-02-14 DIAGNOSIS — I480 Paroxysmal atrial fibrillation: Secondary | ICD-10-CM

## 2020-02-14 DIAGNOSIS — I10 Essential (primary) hypertension: Secondary | ICD-10-CM | POA: Diagnosis not present

## 2020-02-14 DIAGNOSIS — I6340 Cerebral infarction due to embolism of unspecified cerebral artery: Secondary | ICD-10-CM | POA: Diagnosis not present

## 2020-02-14 DIAGNOSIS — J9601 Acute respiratory failure with hypoxia: Secondary | ICD-10-CM | POA: Diagnosis not present

## 2020-02-14 DIAGNOSIS — D62 Acute posthemorrhagic anemia: Secondary | ICD-10-CM | POA: Diagnosis not present

## 2020-02-14 LAB — BASIC METABOLIC PANEL
Anion gap: 8 (ref 5–15)
BUN: 40 mg/dL — ABNORMAL HIGH (ref 8–23)
CO2: 25 mmol/L (ref 22–32)
Calcium: 8.3 mg/dL — ABNORMAL LOW (ref 8.9–10.3)
Chloride: 121 mmol/L — ABNORMAL HIGH (ref 98–111)
Creatinine, Ser: 0.77 mg/dL (ref 0.44–1.00)
GFR calc Af Amer: >60 mL/min (ref >60)
GFR calc non Af Amer: >60 mL/min (ref >60)
Glucose, Bld: 147 mg/dL — ABNORMAL HIGH (ref 70–99)
Potassium: 3.3 mmol/L — ABNORMAL LOW (ref 3.5–5.1)
Sodium: 154 mmol/L — ABNORMAL HIGH (ref 135–145)

## 2020-02-14 LAB — CBC
HCT: 27.2 % — ABNORMAL LOW (ref 36.0–46.0)
Hemoglobin: 7.9 g/dL — ABNORMAL LOW (ref 12.0–15.0)
MCH: 25.6 pg — ABNORMAL LOW (ref 26.0–34.0)
MCHC: 29 g/dL — ABNORMAL LOW (ref 30.0–36.0)
MCV: 88 fL (ref 80.0–100.0)
Platelets: 236 10*3/uL (ref 150–400)
RBC: 3.09 MIL/uL — ABNORMAL LOW (ref 3.87–5.11)
RDW: 16.3 % — ABNORMAL HIGH (ref 11.5–15.5)
WBC: 12.5 10*3/uL — ABNORMAL HIGH (ref 4.0–10.5)
nRBC: 0.2 % (ref 0.0–0.2)

## 2020-02-14 LAB — TYPE AND SCREEN
ABO/RH(D): O NEG
Antibody Screen: NEGATIVE
Unit division: 0
Unit division: 0
Unit division: 0
Unit division: 0

## 2020-02-14 LAB — SODIUM
Sodium: 151 mmol/L — ABNORMAL HIGH (ref 135–145)
Sodium: 154 mmol/L — ABNORMAL HIGH (ref 135–145)

## 2020-02-14 LAB — BPAM RBC
Blood Product Expiration Date: 202104222359
Blood Product Expiration Date: 202104222359
Blood Product Expiration Date: 202105012359
Blood Product Expiration Date: 202105152359
Unit Type and Rh: 9500
Unit Type and Rh: 9500
Unit Type and Rh: 9500
Unit Type and Rh: 9500

## 2020-02-14 LAB — PHOSPHORUS: Phosphorus: 2.3 mg/dL — ABNORMAL LOW (ref 2.5–4.6)

## 2020-02-14 LAB — GLUCOSE, CAPILLARY
Glucose-Capillary: 133 mg/dL — ABNORMAL HIGH (ref 70–99)
Glucose-Capillary: 136 mg/dL — ABNORMAL HIGH (ref 70–99)
Glucose-Capillary: 134 mg/dL — ABNORMAL HIGH (ref 70–99)
Glucose-Capillary: 116 mg/dL — ABNORMAL HIGH (ref 70–99)
Glucose-Capillary: 138 mg/dL — ABNORMAL HIGH (ref 70–99)

## 2020-02-14 LAB — MAGNESIUM: Magnesium: 2.1 mg/dL (ref 1.7–2.4)

## 2020-02-14 LAB — TRIGLYCERIDES: Triglycerides: 119 mg/dL (ref <150)

## 2020-02-14 LAB — LACTIC ACID, PLASMA: Lactic Acid, Venous: 1.8 mmol/L (ref 0.5–1.9)

## 2020-02-14 LAB — PROCALCITONIN: Procalcitonin: 0.12 ng/mL

## 2020-02-14 MED ORDER — SODIUM CHLORIDE 3 % IV SOLN
INTRAVENOUS | Status: DC
  Administered 2020-02-14 – 2020-02-15 (×3): 50 mL/h via INTRAVENOUS
  Filled 2020-02-14 (×5): qty 500

## 2020-02-14 MED ORDER — VITAL HIGH PROTEIN PO LIQD
1000.0000 mL | ORAL | Status: DC
  Administered 2020-02-14 – 2020-02-21 (×5): 1000 mL

## 2020-02-14 MED ORDER — POTASSIUM CHLORIDE 20 MEQ/15ML (10%) PO SOLN
20.0000 meq | ORAL | Status: AC
  Administered 2020-02-14 (×2): 20 meq
  Filled 2020-02-14 (×2): qty 15

## 2020-02-14 MED ORDER — PRO-STAT SUGAR FREE PO LIQD
30.0000 mL | Freq: Three times a day (TID) | ORAL | Status: DC
  Administered 2020-02-14 – 2020-02-21 (×22): 30 mL
  Filled 2020-02-14 (×22): qty 30

## 2020-02-14 MED ORDER — POTASSIUM PHOSPHATES 15 MMOLE/5ML IV SOLN
20.0000 mmol | Freq: Once | INTRAVENOUS | Status: AC
  Administered 2020-02-14: 20 mmol via INTRAVENOUS
  Filled 2020-02-14: qty 6.67

## 2020-02-14 MED ORDER — LISINOPRIL 10 MG PO TABS
10.0000 mg | ORAL_TABLET | Freq: Two times a day (BID) | ORAL | Status: DC
  Administered 2020-02-14: 10 mg via ORAL
  Filled 2020-02-14 (×2): qty 1

## 2020-02-14 MED ORDER — LEVETIRACETAM 100 MG/ML PO SOLN
500.0000 mg | Freq: Two times a day (BID) | ORAL | Status: DC
  Administered 2020-02-14 – 2020-02-21 (×15): 500 mg
  Filled 2020-02-14 (×15): qty 5

## 2020-02-14 NOTE — Progress Notes (Signed)
[facility] ADULT ICU REPLACEMENT PROTOCOL FOR AM LAB REPLACEMENT ONLY  The patient does apply for the [facility] Adult ICU Electrolyte Replacment Protocol based on the criteria listed below:   1. Is GFR >/= 40 ml/min? Yes.    Patient's GFR today is >60 2. Is urine output >/= 0.5 ml/kg/hr for the last 6 hours? Yes.   Patient's UOP is 1.64 ml/kg/hr 3. Is BUN < 60 mg/dL? Yes.    Patient's BUN today is 40 4. Abnormal electrolyte K 3.3 5. Ordered repletion with: protocol 6. If a panic level lab has been reported, has the CCM MD in charge been notified? Yes.  .   Physician:  Tonny Branch, Lang Snow 02/14/2020 5:29 AM

## 2020-02-14 NOTE — Procedures (Signed)
Cortrak  Person Inserting Tube:  King, Mahayla Haddaway E, RD Tube Type:  Cortrak - 43 inches Tube Location:  Left nare Initial Placement:  Stomach Secured by: Bridle Technique Used to Measure Tube Placement:  Documented cm marking at nare/ corner of mouth Cortrak Secured At:  70 cm    Cortrak Tube Team Note:  Consult received to place a Cortrak feeding tube.   No x-ray is required. RN may begin using tube.   If the tube becomes dislodged please keep the tube and contact the Cortrak team at [email or web address] (password [email or web address]) for replacement.  If after hours and replacement cannot be delayed, place a NG tube and confirm placement with an abdominal x-ray.   Chandrea Zellman King, MS, RD, LDN Pager number available on Amion 

## 2020-02-14 NOTE — Progress Notes (Signed)
NAME:  Meredith Cooper, MRN:  [id number], DOB:  09/06/1956, LOS: 5 ADMISSION DATE:  02/17/2020, CONSULTATION DATE:  02/01/2020 REFERRING MD:  Leonie Man - neuro, CHIEF COMPLAINT:  Code stroke - L gaze deviation, R sided weakness   Brief History   32 yowf never smoker  presented to [facility] ED 4/16 as code stroke with sudden onset R sided weakness, L gaze deviation. Rapid deterioration in ED; patient vomited multiple times in CT, then required intubation for airway protection. CTH with large acute parenchymal hemorrhage in L frontal lobe, 8.2 x 6.5 x 5.6 cm, 21mm midline shift.   Past Medical History  HTN HLD Hiatal hernia GERD S/p hysterectomy   Significant Hospital Events   4/16 Code stroke, large ICH, Intubated.  4/17 overnight possible seizure activity, received ativan and started Keppra. Max on cleviprex, added PRN hydralazine for SBP goal <140 4/17  no sz 4/17 pm craniotomy for hematoma evacuation  4/18 AFRVR 4/21 Very good tidals and mechanics on SBT   Consults:  [facility]  Stroke team   Procedures:  4/16 ETT >  4/16 CVC>> 4/16 Arterial line 4/17 Craniotomy/ no drain  Significant Diagnostic Tests:  4/16 CT H> Large acute intraparenchymal hemorrhage centered in L frontal lobe 8.2 x 6.5 x 5.6 cm, 86mm midline shift.  4/16 CTA H> no aneurysm  4/17 CT H> unchanged size of massive IPH centered in L frontal lobe. Interval increase in amount of blood in R lateral ventricle occipital horn. 65mm midline shift  4/17 MRI Brain >>  1. Unchanged large left frontoparietal parenchymal hematoma with unchanged rightward midline shift. Mild surrounding edema and restricted diffusion consistent with associated acute infarct. 2. Small volume intraventricular and subarachnoid hemorrhage. 3. Scattered punctate acute infarcts in both cerebral hemispheres suggesting emboli. - echo 4/17 with mod PAS elevation/ mild LVH 4/19 CT Head - s/p left frontal parietal craniotomy. No new hemorrhage. Improved midline  shift, current 5mm. Stable IVH without hydrocephalus 4/19 EEG -cortical dysfunction in left hemisphere likely secondary to underlying hemorrhage. Additionally, there is evidence of severe diffuse encephalopathy, non specific to etiology.No seizures ordefiniteepileptiform discharges were seen throughout the recording.  Micro Data:  4/16 SARS Cov2   neg  4/16  MRSA PCR  neg  4/18 Respiratory culture-rare GPC's, normal flora   Antimicrobials:  4/16 cefepime>> added for possible aspiration event , end 4/21 4/17 Vanc x1 for OR  Interim history/subjective:   Propofol restarted overnight at low dose, Received lasix for HTN  Overnight, 1x fever 102.2, received APAP Overnight some Afib/flutter  This morning, propofol is off Did brief vent wean PSV/CPAP to assess mechanics -- adequate SBT with very good volumes   Objective   Blood pressure (!) 109/43, pulse 64, temperature (!) 102.2 F (39 C), temperature source Axillary, resp. rate 20, height 5\' 4"  (1.626 m), weight 109.3 kg, SpO2 98 %.    Vent Mode: PRVC FiO2 (%):  [40 %] 40 % Set Rate:  [20 bmp] 20 bmp Vt Set:  [440 mL] 440 mL PEEP:  [5 cmH20] 5 cmH20 Plateau Pressure:  [13 cmH20-20 cmH20] 20 cmH20   Intake/Output Summary (Last 24 hours) at 02/14/2020 0727 Last data filed at 02/14/2020 1287 Gross per 24 hour  Intake 3912.25 ml  Output 3100 ml  Net 812.25 ml   Filed Weights   02/03/2020 1500 01/29/2020 1559  Weight: 109.3 kg 109.3 kg   Physical Exam:  General: Critically ill, obese, adult female, intubated and sedated NAD  HENT: Neurosurgical incision site dressing  intact. L dependent periorbital edema. ETT secure, pink mmm, Trachea midline, anicteric sclera Respiratory: CTA bilaterally. Symmetrical chest expansion and no accessory muscle use on PSV/CPAP  Cardiovascular: RRR s1s2 no rgm 2+ radial pulses Cap refill < 3 seconds BUE BLE  GI: Soft, obese, non-distended, normoactive x4 Extremities: 2+ Pitting edema BUE BLE. No  obvious joint deformity. No cyanosis or clubbing  Neuro: Sedated. Pupils 29mm sluggish. Blink to threat. LLE flexion to noxious stimuli   CBC Latest Ref Rng & Units 02/14/2020 02/13/2020 02/12/2020  WBC 4.0 - 10.5 K/uL 12.5(H) 19.5(H) 17.4(H)  Hemoglobin 12.0 - 15.0 g/dL 7.9(L) 8.6(L) 8.0(L)  Hematocrit 36.0 - 46.0 % 27.2(L) 29.5(L) 27.9(L)  Platelets 150 - 400 K/uL 236 254 213    BMP Latest Ref Rng & Units 02/14/2020 02/13/2020 02/13/2020  Glucose 70 - 99 mg/dL 494(W) - -  BUN 8 - 23 mg/dL 96(P) - -  Creatinine 5.91 - 1.00 mg/dL 6.38 - -  Sodium 466 - 145 mmol/L 154(H) 151(H) 156(H)  Potassium 3.5 - 5.1 mmol/L 3.3(L) - -  Chloride 98 - 111 mmol/L 121(H) - -  CO2 22 - 32 mmol/L 25 - -  Calcium 8.9 - 10.3 mg/dL 8.3(L) - -   Resolved Hospital Problem list     Assessment & Plan:   Acute hypoxic respiratory failure with failure to protect airway, likely aspiration event, in setting of intraparenchymal hemorrhage  -intubated in ED with failed airway protection after vomiting P - Continue vent support - mental status will preclude extubation efforts - Depending on family wishes/neuro prognostication, this may be a candidate for early tracheostomy. Good pulm mechanics  - VAP bundle, pulm hygiene  - PRN CXR  - PAD fentnayl versed   Large ICH with cytotoxic cerebral edema  Possible seizure activity  -s/p craniotomy pm 4/17 s drain -S/p 3% saline P -CVA per neuro, s/p crani per NSGY -on 1/2NS -SBP goal < 160 -BID keppra   Atrial Fibrillation with RVR, rate controlled on dilt gtt  -possibly underlying PAF vs new Afib in setting of acute critical illness  HTN - echo 4/17 with mod PAS elevation/ mild LVH P -Cards consulted  -rec to continue amio gtt and dilt gtt -will appreciate neurology RE timing of anticoagulation initiation -amlodipine, holding lisinopril   Hypokalemia Hypophosphatemia  P -Giving Kphos 4/21   Leukocytosis with possible aspiration/ LLL PNA/  HCAP Intermittent fever, labile temperatures  -respiratory cx consistent with normal flora -? Neurogenic fever   P -5 day course cefepime: last day 4/21. If continues to be febrile can extend course  -Trend WBC, fever curve  -Awaiting BCx results  Hyperglycemia P -SSI  Acute blood loss anemia  -in setting of craniotomy 4/17  P -trend CBC -Goal hgb  >7; no indication for transfusion 4/21  At risk malnutrition P -EN    Best practice:  Diet: EN per RDN  Pain/Anxiety/Delirium protocol (if indicated): RASS goal 0, PRN fentanyl PRN versed VAP protocol (if indicated): yes DVT prophylaxis: SQ heparin  GI prophylaxis: protonix  Glucose control: SSI Mobility: BR Code Status: Full  Family Communication: Per primary  Disposition: ICU    CRITICAL CARE Performed by: Lanier Clam   Total critical care time: 38 minutes  Critical care time was exclusive of separately billable procedures and treating other patients. Critical care was necessary to treat or prevent imminent or life-threatening deterioration.  Critical care was time spent personally by me on the following activities: development of treatment plan with patient  and/or surrogate as well as nursing, discussions with consultants, evaluation of patient's response to treatment, examination of patient, obtaining history from patient or surrogate, ordering and performing treatments and interventions, ordering and review of laboratory studies, ordering and review of radiographic studies, pulse oximetry and re-evaluation of patient's condition.   Tessie Fass MSN, AGACNP-BC [facility] [phone] If no answer, [phone] 02/14/2020, 7:27 AM

## 2020-02-14 NOTE — Progress Notes (Addendum)
Progress Note  Patient Name: Meredith Cooper Date of Encounter: 02/14/2020  Primary Cardiologist: New (Dr. Meda Coffee)  Subjective   Patient intubated and sedated.  Inpatient Medications    Scheduled Meds: . albuterol  2.5 mg Nebulization QID  . chlorhexidine gluconate (MEDLINE KIT)  15 mL Mouth Rinse BID  . Chlorhexidine Gluconate Cloth  6 each Topical Daily  . docusate  100 mg Per Tube BID  . feeding supplement (PRO-STAT SUGAR FREE 64)  60 mL Per Tube TID  . feeding supplement (VITAL HIGH PROTEIN)  1,000 mL Per Tube Q24H  . ferrous sulfate  300 mg Per Tube BID WC  . heparin injection (subcutaneous)  5,000 Units Subcutaneous Q8H  . insulin aspart  0-9 Units Subcutaneous Q4H  . levothyroxine  112 mcg Per Tube Q0600  . mouth rinse  15 mL Mouth Rinse 10 times per day  . pantoprazole sodium  40 mg Per Tube Daily  . senna-docusate  1 tablet Per Tube BID  . sodium chloride flush  10-40 mL Intracatheter Q12H   Continuous Infusions: . sodium chloride 75 mL/hr at 02/14/20 1000  . sodium chloride Stopped (02/13/20 0917)  . amiodarone 30 mg/hr (02/14/20 1000)  . ceFEPime (MAXIPIME) IV Stopped (02/14/20 0714)  . diltiazem (CARDIZEM) infusion 7.5 mg/hr (02/14/20 1000)  . fentaNYL infusion INTRAVENOUS 75 mcg/hr (02/14/20 0900)  . levETIRAcetam Stopped (02/14/20 0920)  . potassium PHOSPHATE IVPB (in mmol) 85 mL/hr at 02/14/20 1000   PRN Meds: sodium chloride, acetaminophen **OR** acetaminophen (TYLENOL) oral liquid 160 mg/5 mL **OR** acetaminophen, fentaNYL (SUBLIMAZE) injection, hydrALAZINE, midazolam, sodium chloride flush   Vital Signs    Vitals:   02/14/20 0758 02/14/20 0800 02/14/20 0900 02/14/20 1000  BP: (!) 142/59 98/62 (!) 140/56 (!) 158/65  Pulse:  63 70 80  Resp:  20 14 (!) 24  Temp:  99.6 F (37.6 C)    TempSrc:  Axillary    SpO2: 99% 99% 98% 98%  Weight:      Height:        Intake/Output Summary (Last 24 hours) at 02/14/2020 1039 Last data filed at 02/14/2020  1000 Gross per 24 hour  Intake 3806.78 ml  Output 2750 ml  Net 1056.78 ml   Last 3 Weights 02/19/2020 02/12/2020 12/20/2019  Weight (lbs) 240 lb 15.4 oz 240 lb 15.4 oz 238 lb 8 oz  Weight (kg) 109.3 kg 109.3 kg 108.183 kg      Telemetry    Some underlying artifact but looks like she is maintaining sinus rhythm with rates in 60's to 90's. - Personally Reviewed  ECG    No new ECG tracing today. - Personally Reviewed  Physical Exam   GEN: Intubated and sedated. Neck: Unable to assess JVD patient position and IVs. Cardiac: RRR. No murmurs, rubs, or gallops.  Respiratory: Clear to auscultation bilaterally.  GI: Soft, non-distended, and non-tender. MS: Mild edema hands and feet. No deformity. Skin: Warm and dry. Neuro:  Intubated and sedated. Psych: Intubated and sedated.  Labs    High Sensitivity Troponin:  No results for input(s): TROPONINIHS in the last 720 hours.    Chemistry Recent Labs  Lab 02/08/2020 1523 02/20/2020 1621 02/12/20 0820 02/12/20 1425 02/13/20 0347 02/13/20 0815 02/13/20 2142 02/14/20 0409 02/14/20 0857  NA 139   < > 159*   < > 158*   < > 151* 154* 151*  K 3.4*   < > 3.1*  --  3.6  --   --  3.3*  --  CL 106   < > 126*  --  127*  --   --  121*  --   CO2 22   < > 23  --  24  --   --  25  --   GLUCOSE 132*   < > 130*  --  120*  --   --  147*  --   BUN 17   < > 29*  --  35*  --   --  40*  --   CREATININE 0.75   < > 0.72  --  0.76  --   --  0.77  --   CALCIUM 9.0   < > 8.6*  --  8.5*  --   --  8.3*  --   PROT 6.7  --   --   --   --   --   --   --   --   ALBUMIN 3.4*  --   --   --   --   --   --   --   --   AST 25  --   --   --   --   --   --   --   --   ALT 19  --   --   --   --   --   --   --   --   ALKPHOS 69  --   --   --   --   --   --   --   --   BILITOT 0.7  --   --   --   --   --   --   --   --   GFRNONAA >60   < > >60  --  >60  --   --  >60  --   GFRAA >60   < > >60  --  >60  --   --  >60  --   ANIONGAP 11   < > 10  --  7  --   --  8  --     < > = values in this interval not displayed.     Hematology Recent Labs  Lab 02/12/20 0551 02/13/20 0347 02/14/20 0409  WBC 17.4* 19.5* 12.5*  RBC 3.12* 3.33* 3.09*  HGB 8.0* 8.6* 7.9*  HCT 27.9* 29.5* 27.2*  MCV 89.4 88.6 88.0  MCH 25.6* 25.8* 25.6*  MCHC 28.7* 29.2* 29.0*  RDW 16.0* 16.2* 16.3*  PLT 213 254 236    BNPNo results for input(s): BNP, PROBNP in the last 168 hours.   DDimer No results for input(s): DDIMER in the last 168 hours.   Radiology    DG CHEST PORT 1 VIEW  Result Date: 02/13/2020 CLINICAL DATA:  Fever EXAM: PORTABLE CHEST 1 VIEW COMPARISON:  Portable exam 1014 hours compared to 02/11/2020 FINDINGS: Tip of endotracheal tube projects 3.0 cm above carina. Nasogastric tube coiled in stomach with tip at fundus. RIGHT jugular line with tip projecting over SVC. Enlargement of cardiac silhouette. Increased opacity at the lung bases question progressive atelectasis or infiltrate. Upper lungs clear. Small LEFT pleural effusion. No pneumothorax. IMPRESSION: Increased bibasilar opacities question atelectasis versus infiltrate. Enlargement of cardiac silhouette with small LEFT pleural effusion. Electronically Signed   By: Lavonia Dana M.D.   On: 02/13/2020 12:04    Cardiac Studies   Echocardiogram 02/17/2020: Impressions: 1. Left ventricular ejection fraction, by estimation, is 65 to 70%. The  left ventricle has normal  function. The left ventricle has no regional  wall motion abnormalities. There is mild left ventricular hypertrophy.  Left ventricular diastolic parameters  are indeterminate.  2. Right ventricular systolic function is normal. The right ventricular  size is normal. There is moderately elevated pulmonary artery systolic  pressure.  3. The mitral valve is normal in structure. Trivial mitral valve  regurgitation.  4. Tricuspid valve regurgitation is moderate.  5. The aortic valve is tricuspid. Aortic valve regurgitation is trivial.  No aortic  stenosis is present.   Patient Profile     64 y.o. female with a history of hypertension, hyperlipidemia, hypothyroidism, and GERD who was admitted on 02/17/2020 with stroke and found to have a intraparenchymal hemorrhage with 85m midline shift requiring craniotomy and evacuation. Cardiology consulted for evaluation of atrial fibrillation with RVR.   Assessment & Plan    Atrial Fibrillation with RVR  - She had scattered punctate infarcts in both cerebral hemispheres on MRI of the head, suggesting emboli. So atrial fibrillation may have been the cause of these.  - Looks like she is maintaining sinus rhythm.  - Potassium 3.3 today. Repleted. Goal >4.0. - Magnesium 2.1 today. Goal >2.0.  - Echo showed LVEF of 65-70% with normal wall motion, moderate TR, and moderately elevated PASP.  - Currently on IV Cardizem and IV Amiodarone.  - CHA2DS2-VASc = 5 (HTN, CVA x2, female and age). WSheran Luzultimately benefit from chronic anticoagulation. However, given intraparenchymal hemorrhage will defer initiation of anticoagulation to Neurology.   Acute Stroke - Head CT showed very large acute parenchymal hemorrhage centered within the left frontal lobe measuring 8.2 x 6.5 x 5.6 cm and 53mrightward midline shift. S/p craniotomy with evacuation on 4/17.  - Still intubated and sedated. - Management per Nephrology.  Hypokalemia - Potassium 3.3 this today. Being repleted.  - Magnesium normal at 2.1.  - Continue to monitor.   Leukocytosis - WBC improving. However, patient did have fever of 102.2 early this morning.  - Chest x-ray yesterday showed increased bibasilar opacities (question atelectasis vs infiltrate) and small left pleural effusion. - Procalcitonin normal.  - Respiratory cultures ordered today.  - Management per primary team.   Otherwise per primary team.   For questions or updates, please contact [facility] consult [email or web address] for contact info under        Signed, CaDarreld McleanPA-C  02/14/2020, 10:39 AM     The patient was seen, examined and discussed with CaDarreld McleanPA-C  and I agree with the above.   The patient remains in sinus rhythm, she is still intubated, I would continue amiodarone drip and Cardizem drip, we will try to keep her away from episodes of atrial fibrillation as she cannot be anticoagulated yet, would appreciate neurology input on when we can start anticoagulation.  We will follow.  KaEna Dawley/21/2021

## 2020-02-14 NOTE — Progress Notes (Signed)
PCT and lactate - ok resp cultures sent  Plan  monitor with current abx without change   Antibiotics Given (last 72 hours)    Date/Time Action Medication Dose Rate   02/11/20 1426 New Bag/Given   ceFEPIme (MAXIPIME) 2 g in sodium chloride 0.9 % 100 mL IVPB 2 g 200 mL/hr   02/11/20 2158 New Bag/Given   ceFEPIme (MAXIPIME) 2 g in sodium chloride 0.9 % 100 mL IVPB 2 g 200 mL/hr   02/12/20 0536 New Bag/Given   ceFEPIme (MAXIPIME) 2 g in sodium chloride 0.9 % 100 mL IVPB 2 g 200 mL/hr   02/12/20 1412 New Bag/Given   ceFEPIme (MAXIPIME) 2 g in sodium chloride 0.9 % 100 mL IVPB 2 g 200 mL/hr   02/12/20 2124 New Bag/Given   ceFEPIme (MAXIPIME) 2 g in sodium chloride 0.9 % 100 mL IVPB 2 g 200 mL/hr   02/13/20 0514 New Bag/Given   ceFEPIme (MAXIPIME) 2 g in sodium chloride 0.9 % 100 mL IVPB 2 g 200 mL/hr   02/13/20 1416 New Bag/Given   ceFEPIme (MAXIPIME) 2 g in sodium chloride 0.9 % 100 mL IVPB 2 g 200 mL/hr   02/13/20 2146 New Bag/Given   ceFEPIme (MAXIPIME) 2 g in sodium chloride 0.9 % 100 mL IVPB 2 g 200 mL/hr   02/14/20 0644 New Bag/Given   ceFEPIme (MAXIPIME) 2 g in sodium chloride 0.9 % 100 mL IVPB 2 g 200 mL/hr       Recent Labs  Lab 02/14/20 0900  PROCALCITON 0.12   Recent Labs  Lab 02/14/20 0900 02/14/20 1031  LATICACIDVEN  --  1.8  PROCALCITON 0.12  --

## 2020-02-14 NOTE — Progress Notes (Signed)
Nutrition Follow-up   DOCUMENTATION CODES:   Morbid obesity  INTERVENTION:   Increase Vital High Protein to 45 ml/hr via Cortrak tube 30 ml Prostat TID  Provides: 1380 kcal, 139 grams protein, and 902 ml free water.    NUTRITION DIAGNOSIS:   Inadequate oral intake related to inability to eat as evidenced by NPO status. Ongoing.   GOAL:   Patient will meet greater than or equal to 90% of their needs Meeting with TF  MONITOR:   Vent status, Labs, Weight trends, TF tolerance, I & O's  REASON FOR ASSESSMENT:   Ventilator, Consult Enteral/tube feeding initiation and management  ASSESSMENT:   64 year old female with PMHx of GERD, hiatal hernia, HTN, HLD admitted with L ICH with midline shift, intubated for airway protection, also with possible seizures.   Pt discussed during ICU rounds and with RN.  Per discussion pt may need early trach. Plan for cortrak placement today.  Cardiology following for AFRVR Pt on abx for possible aspiration PNA as pt has nausea and vomiting on admission  4/17 s/p craniotomy for evacuation 4/21 cortrak, tip gastric  Patient is currently intubated on ventilator support MV: 11.9 L/min Temp (24hrs), Avg:100 F (37.8 C), Min:99.2 F (37.3 C), Max:102.2 F (39 C)  Propofol: off Cleviprex: off  Medications reviewed and include: Colace 100 mg BID, ferrous sulfate, Novolog 0-9 units Q4hrs, senokot-s Hypertonic saline  Amiodarone   Labs reviewed: CBG 134-116 Sodium 151, Chloride 121, Phosphorus 2.3  Enteral Access: Cortrak; tip gastric TF: Vital High Protein at 25 mL/hr (600 mL goal daily volume) + Pro-Stat 60 mL TID per tube. Provides 1200 kcal, 143 grams of protein  Diet Order:   Diet Order            Diet NPO time specified  Diet effective now             EDUCATION NEEDS:   No education needs have been identified at this time  Skin:  Skin Assessment: Reviewed RN Assessment  Last BM:  Unknown/PTA  Height:   Ht  Readings from Last 1 Encounters:  02/15/2020 5\' 4"  (1.626 m)   Weight:   Wt Readings from Last 1 Encounters:  02/01/2020 109.3 kg   Ideal Body Weight:  54.5 kg  BMI:  Body mass index is 41.36 kg/m.  Estimated Nutritional Needs:   Kcal:  02/11/20  Protein:  136 grams  Fluid:  1.6-1.9 L/day  [id number]., RD, LDN, CNSC See AMiON for contact information

## 2020-02-14 NOTE — Progress Notes (Signed)
STROKE TEAM PROGRESS NOTE   INTERVAL HISTORY Daughter at bedside.  Patient still intubated on fentanyl.  Morning temperature 102.2, continue on cefepime.  CCM on board.  Had bronchoscopy and sent for sputum culture.  Remains in sinus rhythm, still on IV amiodarone and Cardizem.  OBJECTIVE Vitals:   02/14/20 0900 02/14/20 1000 02/14/20 1100 02/14/20 1129  BP: (!) 140/56 (!) 158/65 (!) 144/61   Pulse: 70 80 76 82  Resp: 14 (!) 24 (!) 25 (!) 26  Temp:   99.4 F (37.4 C)   TempSrc:      SpO2: 98% 98% 98% 99%  Weight:      Height:        CBC:  Recent Labs  Lab 2020/03/10 1523 Mar 10, 2020 1621 02/13/20 0347 02/14/20 0409  WBC 11.6*   < > 19.5* 12.5*  NEUTROABS 5.8  --   --   --   HGB 10.8*   < > 8.6* 7.9*  HCT 35.5*   < > 29.5* 27.2*  MCV 84.7   < > 88.6 88.0  PLT 307   < > 254 236   < > = values in this interval not displayed.    Basic Metabolic Panel:  Recent Labs  Lab 02/13/20 0347 02/13/20 0815 02/14/20 0409 02/14/20 0857  NA 158*   < > 154* 151*  K 3.6  --  3.3*  --   CL 127*  --  121*  --   CO2 24  --  25  --   GLUCOSE 120*  --  147*  --   BUN 35*  --  40*  --   CREATININE 0.76  --  0.77  --   CALCIUM 8.5*  --  8.3*  --   MG 2.1  --  2.1  --   PHOS 2.3*  --  2.3*  --    < > = values in this interval not displayed.   Lipid Panel:     Component Value Date/Time   CHOL 164 01/29/2020 0347   TRIG 119 02/14/2020 0409   HDL 55 01/30/2020 0347   CHOLHDL 3.0 02/07/2020 0347   VLDL 35 01/25/2020 0347   LDLCALC 74 02/13/2020 0347   HgbA1c:  Lab Results  Component Value Date   HGBA1C 5.6 03/10/20   Urine Drug Screen:     Component Value Date/Time   LABOPIA NONE DETECTED 03-10-2020 1704   COCAINSCRNUR NONE DETECTED 03-10-20 1704   LABBENZ NONE DETECTED 2020/03/10 1704   AMPHETMU NONE DETECTED 10-Mar-2020 1704   THCU NONE DETECTED 2020/03/10 1704   LABBARB NONE DETECTED Mar 10, 2020 1704    Alcohol Level     Component Value Date/Time   ETH <10  10-Mar-2020 1516    IMAGING CT HEAD CODE STROKE WO CONTRAST Mar 10, 2020   IMPRESSION:  Very large acute parenchymal hemorrhage centered within the left frontal lobe measuring 8.2 x 6.5 x 5.6 cm. Surrounding parenchymal edema. Mild intraventricular extension of hemorrhage into the left foramen of Monro, third ventricle and cerebral aqueduct. Associated mass effect with partial effacement of the left lateral ventricle. 5 mm rightward midline shift at the level of the septum pellucidum.   CT Code Stroke CTA Head W/WO contrast 03-10-2020 IMPRESSION:  1. No intracranial large vessel occlusion or proximal high-grade arterial stenosis.  2. No evidence of vascular malformation on the current exam. However, consider imaging follow-up once the hematoma involutes for further assessment.  3. No contrast blush is demonstrated in the region of the acute left  frontal lobe hematoma on arterial phase imaging to suggest active extravasation.  4. No intracranial aneurysm is identified.    CT Code Stroke CTA Neck W/WO contrast 19-Feb-2020 IMPRESSION:  The bilateral common carotid, internal carotid and vertebral arteries are patent within the neck without significant stenosis.   CT HEAD WO CONTRAST 01/27/2020 IMPRESSION:  1. Unchanged size of massive intraparenchymal hematoma centered in the left frontal lobe.  2. Increased amount of blood in the occipital horn of the right lateral ventricle.  3. Unchanged rightward midline shift, measuring 5 mm.   MR BRAIN WO CONTRAST 02/23/2020 IMPRESSION 1. Unchanged large left frontoparietal parenchymal hematoma with unchanged rightward midline shift. Mild surrounding edema and restricted diffusion consistent with associated acute infarct. 2. Small volume intraventricular and subarachnoid hemorrhage. 3. Scattered punctate acute infarcts in both cerebral hemispheres suggesting emboli.   CT HEAD WO CONTRAST 02/12/2020 IMPRESSION:  1. Status post left frontal parietal  craniotomy for evacuation of the hemorrhage. 2. Hemorrhage is significantly debulked. Morselized Gel-Foam is present within the surgical cavity. 3. No new hemorrhage. 4. Improved midline shift, now measuring 4 mm. 5. Stable intraventricular hemorrhage without hydrocephalus.  CT HEAD WO CONTRAST 02/14/2020 IMPRESSION:  1. Postoperative changes from left frontoparietal hematoma evacuation with unchanged residual blood products, edema, and 4 mm of rightward midline shift. 2. Unchanged small volume intraventricular blood products without ventriculomegaly. 3. No new intracranial abnormality.  DG Chest Port 1 View 02/19/2020 Increasing left basilar atelectasis. Central line as described without pneumothorax.  February 19, 2020 Tubes and lines as described above. The gastric catheter lies in the distal esophagus and should be advanced several cm further into the stomach. Increased interstitial markings likely related to the poor inspiratory effort however mild edema cannot be excluded.  02/04/2020 No change in small left pleural effusion and adjacent Airspace disease, likely atelectasis. 02/11/2020 1. Well-positioned support structures. No pneumothorax. 2. Stable hazy bibasilar lung opacities, favor atelectasis. 3. Stable mild cardiomegaly without overt pulmonary edema.  02/13/2020 Increased bibasilar opacities question atelectasis versus infiltrate. Enlargement of cardiac silhouette with small LEFT pleural effusion.  DG Abd Portable 1V Feb 19, 2020 IMPRESSION:  Gastric catheter as described.   Transthoracic Echocardiogram  1. Left ventricular ejection fraction, by estimation, is 65 to 70%. The left ventricle has normal function. The left ventricle has no regional wall motion abnormalities. There is mild left ventricular hypertrophy. Left ventricular diastolic parameters are indeterminate.  2. Right ventricular systolic function is normal. The right ventricular size is normal. There is moderately  elevated pulmonary artery systolic pressure.  3. The mitral valve is normal in structure. Trivial mitral valve regurgitation.  4. Tricuspid valve regurgitation is moderate.  5. The aortic valve is tricuspid. Aortic valve regurgitation is trivial. No aortic stenosis is present.  LTM EEG  cortical dysfunction in left hemisphere likely secondary to underlying hemorrhage. Additionally, there is evidence of severe diffuse encephalopathy, non specific to etiology. No seizures or definite epileptiform discharges were seen throughout the recording. Event button was pressed on 02/16/2020 as described above without concomitant eeg change and was likely not epileptic.   PHYSICAL EXAM   Temp:  [97.9 F (36.6 C)-102.2 F (39 C)] 99.4 F (37.4 C) (04/21 1100) Pulse Rate:  [54-87] 82 (04/21 1129) Resp:  [10-26] 26 (04/21 1129) BP: (98-170)/(43-93) 144/61 (04/21 1100) SpO2:  [97 %-100 %] 99 % (04/21 1129) FiO2 (%):  [40 %] 40 % (04/21 1133)  General - Well nourished, well developed, intubated on sedation.  Ophthalmologic - fundi not visualized due to noncooperation.  Cardiovascular - Regular rate and rhythm, not in afib anymore.  Neuro - intubated on fentanyl, eyes closed not open on voice or pain, not following commands. Eyes in mid position, not blinking to visual threat, doll's eyes sluggish, not tracking, pupil equal but sluggish to light. Corneal reflex absent bilaterally, gag and cough very weak. Breathing over the vent.  Facial symmetry not able to test due to ET tube.  Tongue protrusion not cooperative. On pain stimulation, left UE slight withdraw but no movement on other limbs. DTR 1+ and bilateral positive babinski. Sensation, coordination and gait not tested.   ASSESSMENT/PLAN Ms. Shamra Bradeen is a 64 y.o. female with history of Htn, Hld and estrogen therapy presenting with left gaze and right side flaccid. Vomited and was intubated  She did not receive IV t-PA due to [place].  Stroke:   left MCA and bilateral ACA infarcts with large hematoma centered within the left frontal lobe likely due to hemorrhagic conversion s/p crani for hematoma evacuation - stroke etiology likely due to PAF  CT Head 02-15-20 - Very large acute parenchymal hemorrhage centered within the left frontal lobe measuring 8.2 x 6.5 x 5.6 cm. 5 mm rightward midline shift at the level of the septum pellucidum.   CT head - 02/07/2020 - Unchanged size of massive intraparenchymal hematoma centered in the left frontal lobe.  MRI head - unchanged hematoma and surrounding edema. DWI signal at left MCA and ACA consistent with acute infarcts. Scattered punctate acute infarcts in both cerebral hemispheres suggesting emboli  CTA H&N - No large vessel occlusion or high-grade arterial stenosis. No evidence of vascular malformation on the current exam.  CT repeat 4/19 - significantly decreased hematoma post evacuation  CT repeat 4/21 - stable residual hematoma and 4 mm of midline shift  2D Echo - EF 65-70%   [employer] Virus 2 - negative - Ab positive  LDL - 74  HgbA1c - 5.6  UDS - negative  VTE prophylaxis - heparin subq  No antithrombotic prior to admission, now on No antithrombotic due to hemorrhage.  Therapy recommendations:  Re-order once pt stable for evaluation  Disposition:  Pending  Acute Respiratory Failure  Intubated for airway protection  On sedation   CCM on board  Wean as able   Family hope to avoid trach  Possible Aspiration Pneumonia Leukocytosis Fever   Nausea vomiting on presentation  Cefepime 4/16 >>  CCM on board  Temp - 100.5 -> 100.7->101.6 -> afebrile->101.1->102.2  WBCs - 11.6->14.2->14.7->17.4->19.5->12.5  CXR 4/20 - Increased bibasilar opacities question atelectasis versus infiltrate.  UA neg  Resp Cx 4/18 normal flora  Blood Cx NGTD   Tracheal aspirate Cx - pending  Cerebral edema  5 mm Rightward Midline Shift on CT and MRI  NSG on board s/p hematoma  evacuation 4/17   CT 4/21 stable residual hematoma and midline shift  On 3% at 75 cc/hr -> 50 cc -> NS@50  -> 0.45%@75  -> 3% saline @ 50   23.4% saline x1  Na - 139->142->146->157->159->161->160->159->158->156->121->154->151->154  Na every 6h  Atrial Fibrillation w/ RVR  New diagnosis  Given her embolic strokes on MRI, pt likely has PAF before admission  On amio and dilt gtt - continue    Now back to [facility]  Cardiology consulted - appreciate help  May consider AC once blood absorbed in the future  UE shivering/shaking  Bilateral versus unilateral arm shaking/shivering  LTM EEG no seizure to correlate the shaking -> discontinued 4/19  Keppra - 500  mg Q 12 hrs after Keppra 2 g load  Seizure precautions  Hypertension  Home BP meds: Zestril 10 mg daily  off Cleviprex and normodyne (infusion)  On cardizem drip  Add lisinopril 10mg  bid  Stable on the high end . SBP goal < 160 mm Hg  . Long-term BP goal normotensive  Hyperlipidemia  Home Lipid lowering medication: Pravachol 20 mg daily  LDL 74, goal < 70  Current lipid lowering medication: None for now  May resume statin at discharge vs. SATURN trial  Dysphagia At risk malnutrition   N.p.o.  On tube feeding at 25 cc/h -> 45cc  Speech following  Dietitian on board  Anemia, acute blood loss  Hb - 10.8->10.0->8.6->8.0->8.6->7.9   Likely due to post op  S/p PRBC in OR  Iron 16 and ferritin 66  CBC monitoring  Iron pills added  Other Stroke Risk Factors  Advanced age  Morbid Obesity, Body mass index is 41.36 kg/m., recommend weight loss, diet and exercise as appropriate   On estrogen daily po PTA  Other Active Problems  Code status - Full code  Hypokalemia - treated - K 3.4->4.1->3.5->3.0->3.1->3.6->3.3   Hypophosphatemia - treated - Phos 1.7->2.2->1.8->2.3->2.3   Family requested COVID Ab testing after COVID vaccine - serology reactive  Hospital day # 5  This patient is  critically ill due to large ICH, hemorrhagic conversion, hypertensive emergency, seizure, respiratory failure and at significant risk of neurological worsening, death form cerebral edema, brain herniation, hematoma expansion, recurrent stroke, epilepsy status. This patient's care requires constant monitoring of vital signs, hemodynamics, respiratory and cardiac monitoring, review of multiple databases, neurological assessment, discussion with family, other specialists and medical decision making of high complexity. I spent 40 minutes of neurocritical care time in the care of this patient. I had long discussion with daughter at bedside, updated pt current condition, treatment plan and potential prognosis, and answered all the questions.  She expressed understanding and appreciation.   June, MD PhD Stroke Neurology 02/14/2020 11:42 AM   To contact Stroke Continuity provider, please refer to 02/16/2020. After hours, contact General Neurology

## 2020-02-15 ENCOUNTER — Encounter: Payer: Self-pay | Admitting: *Deleted

## 2020-02-15 DIAGNOSIS — Z978 Presence of other specified devices: Secondary | ICD-10-CM | POA: Diagnosis not present

## 2020-02-15 DIAGNOSIS — D62 Acute posthemorrhagic anemia: Secondary | ICD-10-CM | POA: Diagnosis not present

## 2020-02-15 DIAGNOSIS — I6340 Cerebral infarction due to embolism of unspecified cerebral artery: Secondary | ICD-10-CM | POA: Diagnosis not present

## 2020-02-15 DIAGNOSIS — I10 Essential (primary) hypertension: Secondary | ICD-10-CM | POA: Diagnosis not present

## 2020-02-15 DIAGNOSIS — I480 Paroxysmal atrial fibrillation: Secondary | ICD-10-CM | POA: Diagnosis not present

## 2020-02-15 DIAGNOSIS — J9601 Acute respiratory failure with hypoxia: Secondary | ICD-10-CM | POA: Diagnosis not present

## 2020-02-15 LAB — GLUCOSE, CAPILLARY
Glucose-Capillary: 123 mg/dL — ABNORMAL HIGH (ref 70–99)
Glucose-Capillary: 128 mg/dL — ABNORMAL HIGH (ref 70–99)
Glucose-Capillary: 129 mg/dL — ABNORMAL HIGH (ref 70–99)
Glucose-Capillary: 132 mg/dL — ABNORMAL HIGH (ref 70–99)
Glucose-Capillary: 133 mg/dL — ABNORMAL HIGH (ref 70–99)
Glucose-Capillary: 138 mg/dL — ABNORMAL HIGH (ref 70–99)
Glucose-Capillary: 150 mg/dL — ABNORMAL HIGH (ref 70–99)

## 2020-02-15 LAB — CBC WITH DIFFERENTIAL/PLATELET
Abs Immature Granulocytes: 0.22 10*3/uL — ABNORMAL HIGH (ref 0.00–0.07)
Basophils Absolute: 0.1 10*3/uL (ref 0.0–0.1)
Basophils Relative: 0 %
Eosinophils Absolute: 0 10*3/uL (ref 0.0–0.5)
Eosinophils Relative: 0 %
HCT: 28.5 % — ABNORMAL LOW (ref 36.0–46.0)
Hemoglobin: 8.1 g/dL — ABNORMAL LOW (ref 12.0–15.0)
Immature Granulocytes: 2 %
Lymphocytes Relative: 9 %
Lymphs Abs: 1.1 10*3/uL (ref 0.7–4.0)
MCH: 25.2 pg — ABNORMAL LOW (ref 26.0–34.0)
MCHC: 28.4 g/dL — ABNORMAL LOW (ref 30.0–36.0)
MCV: 88.8 fL (ref 80.0–100.0)
Monocytes Absolute: 1.5 10*3/uL — ABNORMAL HIGH (ref 0.1–1.0)
Monocytes Relative: 12 %
Neutro Abs: 9.4 10*3/uL — ABNORMAL HIGH (ref 1.7–7.7)
Neutrophils Relative %: 77 %
Platelets: 276 10*3/uL (ref 150–400)
RBC: 3.21 MIL/uL — ABNORMAL LOW (ref 3.87–5.11)
RDW: 16.3 % — ABNORMAL HIGH (ref 11.5–15.5)
WBC: 12.4 10*3/uL — ABNORMAL HIGH (ref 4.0–10.5)
nRBC: 0.3 % — ABNORMAL HIGH (ref 0.0–0.2)

## 2020-02-15 LAB — BASIC METABOLIC PANEL
Anion gap: 6 (ref 5–15)
BUN: 43 mg/dL — ABNORMAL HIGH (ref 8–23)
CO2: 25 mmol/L (ref 22–32)
Calcium: 8.5 mg/dL — ABNORMAL LOW (ref 8.9–10.3)
Chloride: 127 mmol/L — ABNORMAL HIGH (ref 98–111)
Creatinine, Ser: 0.7 mg/dL (ref 0.44–1.00)
GFR calc Af Amer: >60 mL/min (ref >60)
GFR calc non Af Amer: >60 mL/min (ref >60)
Glucose, Bld: 152 mg/dL — ABNORMAL HIGH (ref 70–99)
Potassium: 3.7 mmol/L (ref 3.5–5.1)
Sodium: 158 mmol/L — ABNORMAL HIGH (ref 135–145)

## 2020-02-15 LAB — SODIUM
Sodium: 158 mmol/L — ABNORMAL HIGH (ref 135–145)
Sodium: 161 mmol/L (ref 135–145)
Sodium: 160 mmol/L — ABNORMAL HIGH (ref 135–145)

## 2020-02-15 LAB — PHOSPHORUS: Phosphorus: 2.3 mg/dL — ABNORMAL LOW (ref 2.5–4.6)

## 2020-02-15 LAB — TRIGLYCERIDES
Triglycerides: 116 mg/dL (ref <150)
Triglycerides: 130 mg/dL (ref <150)

## 2020-02-15 LAB — PROCALCITONIN: Procalcitonin: 0.11 ng/mL

## 2020-02-15 MED ORDER — POTASSIUM PHOSPHATES 15 MMOLE/5ML IV SOLN
20.0000 mmol | Freq: Once | INTRAVENOUS | Status: AC
  Administered 2020-02-15: 20 mmol via INTRAVENOUS
  Filled 2020-02-15: qty 6.67

## 2020-02-15 MED ORDER — LISINOPRIL 10 MG PO TABS
10.0000 mg | ORAL_TABLET | Freq: Two times a day (BID) | ORAL | Status: DC
  Administered 2020-02-15: 10 mg

## 2020-02-15 MED ORDER — FUROSEMIDE 10 MG/ML IJ SOLN
60.0000 mg | Freq: Once | INTRAMUSCULAR | Status: AC
  Administered 2020-02-15: 60 mg via INTRAVENOUS
  Filled 2020-02-15: qty 6

## 2020-02-15 MED ORDER — SODIUM CHLORIDE 0.9 % IV SOLN: INTRAVENOUS | Status: DC

## 2020-02-15 MED ORDER — PROPOFOL 1000 MG/100ML IV EMUL
5.0000 ug/kg/min | INTRAVENOUS | Status: DC
  Administered 2020-02-15: 5 ug/kg/min via INTRAVENOUS
  Administered 2020-02-16: 16 ug/kg/min via INTRAVENOUS
  Administered 2020-02-16 – 2020-02-17 (×3): 20 ug/kg/min via INTRAVENOUS
  Administered 2020-02-17: 19.982 ug/kg/min via INTRAVENOUS
  Administered 2020-02-18 (×2): 20 ug/kg/min via INTRAVENOUS
  Administered 2020-02-19 (×2): 30 ug/kg/min via INTRAVENOUS
  Filled 2020-02-15 (×11): qty 100

## 2020-02-15 NOTE — Progress Notes (Signed)
SLP Cancellation Note  Patient Details Name: Meredith Cooper MRN: [id number] DOB: Jan 16, 1956   Cancelled treatment:       Reason Eval/Treat Not Completed: Patient not medically ready; remains intubated.  SLP will sign off. Please reorder when extubated.  Raychel Dowler L. Samson Frederic, MA CCC/SLP Acute Rehabilitation Services Office number [phone] Pager [phone]    Carolan Shiver 02/15/2020, 9:19 AM

## 2020-02-15 NOTE — Progress Notes (Signed)
NAME:  Meredith Cooper, MRN:  [id number], DOB:  1956/09/25, LOS: 6 ADMISSION DATE:  02/08/2020, CONSULTATION DATE:  02/03/2020 REFERRING MD:  Pearlean Brownie - neuro, CHIEF COMPLAINT:  Code stroke - L gaze deviation, R sided weakness   Brief History   64 yowf never smoker  presented to [facility] ED 4/16 as code stroke with sudden onset R sided weakness, L gaze deviation. Rapid deterioration in ED; patient vomited multiple times in CT, then required intubation for airway protection. CTH with large acute parenchymal hemorrhage in L frontal lobe, 8.2 x 6.5 x 5.6 cm, 35mm midline shift.   Past Medical History  HTN HLD Hiatal hernia GERD S/p hysterectomy   Significant Hospital Events   4/16 Code stroke, large ICH, Intubated.  4/17 overnight possible seizure activity, received ativan and started Keppra. Max on cleviprex, added PRN hydralazine for SBP goal <140 4/17  no sz 4/17 pm craniotomy for hematoma evacuation  4/18 AFRVR 4/21 Very good tidals and mechanics on SBT -  Propofol restarted overnight at low dose, Received lasix for HTN  Overnight, 1x fever 102.2, received APAP Overnight some Afib/flutter  This morning, propofol is off Did brief vent wean PSV/CPAP to assess mechanics -- adequate SBT with very good volumes   Consults:  NSGY PCCM  Stroke team   Procedures:  4/16 ETT >  4/16 CVC>> 4/16 Arterial line 4/17 Craniotomy/ no drain  Significant Diagnostic Tests:  4/16 CT H> Large acute intraparenchymal hemorrhage centered in L frontal lobe 8.2 x 6.5 x 5.6 cm, 45mm midline shift.  4/16 CTA H> no aneurysm  4/17 CT H> unchanged size of massive IPH centered in L frontal lobe. Interval increase in amount of blood in R lateral ventricle occipital horn. 79mm midline shift  4/17 MRI Brain >>  1. Unchanged large left frontoparietal parenchymal hematoma with unchanged rightward midline shift. Mild surrounding edema and restricted diffusion consistent with associated acute infarct. 2. Small volume  intraventricular and subarachnoid hemorrhage. 3. Scattered punctate acute infarcts in both cerebral hemispheres suggesting emboli. - echo 4/17 with mod PAS elevation/ mild LVH 4/19 CT Head - s/p left frontal parietal craniotomy. No new hemorrhage. Improved midline shift, current 29mm. Stable IVH without hydrocephalus 4/19 EEG -cortical dysfunction in left hemisphere likely secondary to underlying hemorrhage. Additionally, there is evidence of severe diffuse encephalopathy, non specific to etiology.No seizures ordefiniteepileptiform discharges were seen throughout the recording.  Micro Data:  4/16 SARS Cov2   neg  4/16  MRSA PCR  neg  4/18 Respiratory culture-rare GPC's, normal flora   Antimicrobials:  4/16 cefepime>> added for possible aspiration event , end 4/21 4/17 Vanc x1 for OR  Interim history/subjective:    02/15/2020 - fever curve down. WBC plateau. PCT 0.11. Trach aspirate negative so far. Doing PSV. 3% saline stopped following severe anasarca. RN reporting sudden left orbital swelling and left lip and tongue.  Daughter concurs though thinks is dependency related. PEr RN Neuro MD felt same x 24h  . Patient noticed be on lisinoprl. Daughter  Objective   Blood pressure (!) 117/51, pulse 71, temperature 98.4 F (36.9 C), temperature source Axillary, resp. rate 17, height 5\' 4"  (1.626 m), weight 112.6 kg, SpO2 100 %.    Vent Mode: CPAP;PSV FiO2 (%):  [40 %] 40 % Set Rate:  [20 bmp] 20 bmp Vt Set:  [440 mL] 440 mL PEEP:  [5 cmH20] 5 cmH20 Pressure Support:  [12 cmH20] 12 cmH20 Plateau Pressure:  [7 cmH20-27 cmH20] 25 cmH20   Intake/Output Summary (  Last 24 hours) at 02/15/2020 1259 Last data filed at 02/15/2020 1100 Gross per 24 hour  Intake 3419.19 ml  Output 250 ml  Net 3169.19 ml   Filed Weights   01/25/2020 1500 01/30/2020 1559 02/15/20 0500  Weight: 109.3 kg 109.3 kg 112.6 kg   Physical Exam:  General Appearance:  Looks criticall ill OBESE - + Head:  Normocephalic,  without obvious abnormality, atraumatic Eyes:  PERRL - unable to examine, conjunctiva/corneas - unable to examine due to edema . Left orbtail edema > Right side     Ears:  Normal external ear canals, both ears Nose:  G tube - CORTRAK + Throat:  ETT TUBE - yes , OG tube - no Neck:  Supple,  No enlargement/tenderness/nodules Lungs: Clear to auscultation bilaterally, Ventilator   Synchrony - yes Heart:  S1 and S2 normal, no murmur, CVP - no.  Pressors - o Abdomen:  Soft, no masses, no organomegaly Genitalia / Rectal:  Not done Extremities:  Extremities- INtact but wit +++ edema Skin:  ntact in exposed areas . Sacral area - not examind Neurologic:  Sedation - fent gtt -> RASS - -3         Resolved Hospital Problem list     Assessment & Plan:   Acute hypoxic respiratory failure with failure to protect airway, likely aspiration event, in setting of intraparenchymal hemorrhage  -intubated in ED with failed airway protection after vomiting   02/15/2020 - >  Doing SBT but does not meet criteria for extubation in setting of Acute Respiratory Failure due to continued oingoing encephaloapthy (LOS 6 days)   P - Continue vent support - mental status will preclude extubation efforts - Depending on family wishes/neuro prognostication, this may be a candidate for early tracheostomy. Good pulm mechanics  - VAP bundle, pulm hygiene  - PRN CXR  - PAD fentnayl versed   Possible angioedema 02/15/20  - hard to differentiate between anasarca . Suddent per RN  Plan  - dc home lisinopril being given  Large ICH with cytotoxic cerebral edema  Possible seizure activity  -s/p craniotomy pm 4/17 s drain -S/p 3% saline ending 02/15/20   P -CVA per neuro, s/p crani per NSGY -SBP goal < 160 -BID keppra   Volume overload 02/14/20  Plan  - lasix x 1  - monitor Na now off 3% saline  Atrial Fibrillation with RVR, rate controlled on dilt gtt  -possibly underlying PAF vs new Afib in setting of  acute critical illness  HTN - echo 4/17 with mod PAS elevation/ mild LVH  - A Fib continues 02/15/20  P -Cards consulted  -rec to continue amio gtt and dilt gtt -will appreciate neurology RE timing of anticoagulation initiation   Hypokalemia Hypophosphatemia  P -Giving Kphos 4/22  Leukocytosis with possible aspiration/ LLL PNA/ HCAP Intermittent fever, labile temperatures    02/15/2020 0-fever curve comonign down. PCT ok. REsp cultures 4/21 pending    P - complete cefepime 4/22  - await cultures  Hyperglycemia P -SSI  Acute blood loss anemia  -in setting of craniotomy 4/17   P -trend CBC -Goal hgb  >7; no indication for transfusion 4/22  At risk malnutrition P -TF   Best practice:  Diet: EN per RDN  Pain/Anxiety/Delirium protocol (if indicated): RASS goal 0, PRN fentanyl PRN versed VAP protocol (if indicated): yes DVT prophylaxis: SQ heparin  GI prophylaxis: protonix  Glucose control: SSI Mobility: BR Code Status: Full  Family Communication: Per primary but daughte updated from  ccm perspective on 02/15/20  Disposition: ICU     ATTESTATION & SIGNATURE   The patient Meredith Cooper is critically ill with multiple organ systems failure and requires high complexity decision making for assessment and support, frequent evaluation and titration of therapies, application of advanced monitoring technologies and extensive interpretation of multiple databases.   Critical Care Time devoted to patient care services described in this note is  31  Minutes. This time reflects time of care of this signee Dr Brand Males. This critical care time does not reflect procedure time, or teaching time or supervisory time of PA/NP/Med student/Med Resident etc but could involve care discussion time     Dr. Brand Males, M.D., [facility].C.P Pulmonary and Critical Care Medicine Staff Physician [facility] Pager: [phone], If no  answer or between  15:00h - 7:00h: call [phone]  02/15/2020 1:00 PM    LABS    PULMONARY Recent Labs  Lab 06-Mar-2020 1522 03-06-2020 1621  PHART  --  7.394  PCO2ART  --  43.5  PO2ART  --  423.0*  HCO3  --  26.6  TCO2 23 28  O2SAT  --  100.0    CBC Recent Labs  Lab 02/13/20 0347 02/14/20 0409 02/15/20 0516  HGB 8.6* 7.9* 8.1*  HCT 29.5* 27.2* 28.5*  WBC 19.5* 12.5* 12.4*  PLT 254 236 276    COAGULATION Recent Labs  Lab Mar 06, 2020 1523  INR 0.9    CARDIAC  No results for input(s): TROPONINI in the last 168 hours. No results for input(s): PROBNP in the last 168 hours.   CHEMISTRY Recent Labs  Lab 02/06/2020 0347 02/22/2020 0924 02/11/20 0202 02/11/20 0741 02/12/20 0551 02/12/20 0551 02/12/20 0820 02/12/20 1425 02/13/20 0347 02/13/20 0815 02/14/20 0409 02/14/20 0857 02/14/20 1900 02/14/20 2232 02/15/20 0516  NA 146*   < > 157*   < > 160*   < > 159*   < > 158*   < > 154* 151* 154* 158* 158*  K 4.1  --  3.5  --  3.0*   < > 3.1*  --  3.6  --  3.3*  --   --   --  3.7  CL 116*  --  >130*  --  127*  --  126*  --  127*  --  121*  --   --   --  127*  CO2 22  --  21*  --  24  --  23  --  24  --  25  --   --   --  25  GLUCOSE 146*  --  118*  --  153*  --  130*  --  120*  --  147*  --   --   --  152*  BUN 13  --  20  --  29*  --  29*  --  35*  --  40*  --   --   --  43*  CREATININE 0.82  --  0.81  --  0.72  --  0.72  --  0.76  --  0.77  --   --   --  0.70  CALCIUM 8.2*  --  7.8*  --  8.5*  --  8.6*  --  8.5*  --  8.3*  --   --   --  8.5*  MG 2.0  --  2.1  --  2.2  --   --   --  2.1  --  2.1  --   --   --   --   PHOS 1.7*  --  2.2*  --  1.8*  --   --   --  2.3*  --  2.3*  --   --   --  2.3*   < > = values in this interval not displayed.   Estimated Creatinine Clearance: 85.1 mL/min (by C-G formula based on SCr of 0.7 mg/dL).   LIVER Recent Labs  Lab 2020-02-14 1523  AST 25  ALT 19  ALKPHOS 69  BILITOT 0.7  PROT 6.7  ALBUMIN 3.4*  INR 0.9      INFECTIOUS Recent Labs  Lab 02/14/20 0900 02/14/20 1031 02/15/20 0516  LATICACIDVEN  --  1.8  --   PROCALCITON 0.12  --  0.11     ENDOCRINE CBG (last 3)  Recent Labs    02/15/20 0352 02/15/20 0846 02/15/20 1159  GLUCAP 138* 123* 129*         IMAGING x48h  - image(s) personally visualized  -   highlighted in bold CT HEAD WO CONTRAST  Result Date: 02/14/2020 CLINICAL DATA:  Follow-up intracranial hemorrhage. EXAM: CT HEAD WITHOUT CONTRAST TECHNIQUE: Contiguous axial images were obtained from the base of the skull through the vertex without intravenous contrast. COMPARISON:  02/12/2020 FINDINGS: Brain: Postsurgical changes are again seen related to evacuation of a large left frontoparietal parenchymal hematoma. Residual left frontoparietal blood products and gas are similar to the prior motion degraded CT. Intraventricular extension is again noted with unchanged small volume blood products in the lateral ventricles, and there is no ventriculomegaly. Left frontoparietal edema is unchanged as is 4 mm of rightward midline shift. No new intracranial hemorrhage, new infarct, or extra-axial fluid collection is identified. Vascular: No hyperdense vessel. Skull: Left frontoparietal craniotomy with overlying soft tissue swelling and skin staples. Sinuses/Orbits: Mild mucosal thickening in the paranasal sinuses. Small left mastoid effusion. Unremarkable orbits. Other: None. IMPRESSION: 1. Postoperative changes from left frontoparietal hematoma evacuation with unchanged residual blood products, edema, and 4 mm of rightward midline shift. 2. Unchanged small volume intraventricular blood products without ventriculomegaly. 3. No new intracranial abnormality. Electronically Signed   By: Sebastian Ache M.D.   On: 02/14/2020 16:49

## 2020-02-15 NOTE — Progress Notes (Signed)
Progress Note  Patient Name: Meredith Cooper Date of Encounter: 02/15/2020  Primary Cardiologist: New (Dr. Meda Coffee)  Subjective   Patient intubated and sedated.  Inpatient Medications    Scheduled Meds: . albuterol  2.5 mg Nebulization QID  . chlorhexidine gluconate (MEDLINE KIT)  15 mL Mouth Rinse BID  . Chlorhexidine Gluconate Cloth  6 each Topical Daily  . docusate  100 mg Per Tube BID  . feeding supplement (PRO-STAT SUGAR FREE 64)  30 mL Per Tube TID  . ferrous sulfate  300 mg Per Tube BID WC  . heparin injection (subcutaneous)  5,000 Units Subcutaneous Q8H  . insulin aspart  0-9 Units Subcutaneous Q4H  . levETIRAcetam  500 mg Per Tube BID  . levothyroxine  112 mcg Per Tube Q0600  . lisinopril  10 mg Per Tube BID  . mouth rinse  15 mL Mouth Rinse 10 times per day  . pantoprazole sodium  40 mg Per Tube Daily  . senna-docusate  1 tablet Per Tube BID  . sodium chloride flush  10-40 mL Intracatheter Q12H   Continuous Infusions: . sodium chloride Stopped (02/13/20 0917)  . amiodarone 30 mg/hr (02/15/20 0600)  . ceFEPime (MAXIPIME) IV 2 g (02/15/20 0644)  . diltiazem (CARDIZEM) infusion 7.5 mg/hr (02/15/20 0600)  . feeding supplement (VITAL HIGH PROTEIN) 1,000 mL (02/15/20 0953)  . fentaNYL infusion INTRAVENOUS 50 mcg/hr (02/15/20 1014)  . sodium chloride (hypertonic) 50 mL/hr (02/15/20 1007)   PRN Meds: sodium chloride, acetaminophen **OR** acetaminophen (TYLENOL) oral liquid 160 mg/5 mL **OR** acetaminophen, fentaNYL (SUBLIMAZE) injection, hydrALAZINE, midazolam, sodium chloride flush   Vital Signs    Vitals:   02/15/20 0745 02/15/20 0800 02/15/20 0900 02/15/20 1000  BP: (!) 144/45 (!) 113/48 (!) 146/59 (!) 181/62  Pulse: 92 88 92 80  Resp: (!) 25 (!) 22 20 (!) 27  Temp:  99.2 F (37.3 C)    TempSrc:  Axillary    SpO2: 98% 99% 99% 99%  Weight:      Height:        Intake/Output Summary (Last 24 hours) at 02/15/2020 1034 Last data filed at 02/15/2020 1000 Gross  per 24 hour  Intake 3679.5 ml  Output 250 ml  Net 3429.5 ml   Last 3 Weights 02/15/2020 02/02/2020 02/20/2020  Weight (lbs) 248 lb 3.8 oz 240 lb 15.4 oz 240 lb 15.4 oz  Weight (kg) 112.6 kg 109.3 kg 109.3 kg      Telemetry    Some underlying artifact but looks like she is maintaining sinus rhythm with rates in 60's to 90's. - Personally Reviewed  ECG    No new ECG tracing today. - Personally Reviewed  Physical Exam   GEN: Intubated and sedated. Neck: Unable to assess JVD patient position and IVs. Cardiac: RRR. No murmurs, rubs, or gallops.  Respiratory: Clear to auscultation bilaterally.  GI: Soft, non-distended, and non-tender. MS: Mild edema hands and feet. No deformity. Skin: Warm and dry. Neuro:  Intubated and sedated. Psych: Intubated and sedated.  Labs    High Sensitivity Troponin:  No results for input(s): TROPONINIHS in the last 720 hours.    Chemistry Recent Labs  Lab 02/16/2020 1523 02/03/2020 1621 02/13/20 0347 02/13/20 0815 02/14/20 0409 02/14/20 0857 02/14/20 1900 02/14/20 2232 02/15/20 0516  NA 139   < > 158*   < > 154*   < > 154* 158* 158*  K 3.4*   < > 3.6  --  3.3*  --   --   --  3.7  CL 106   < > 127*  --  121*  --   --   --  127*  CO2 22   < > 24  --  25  --   --   --  25  GLUCOSE 132*   < > 120*  --  147*  --   --   --  152*  BUN 17   < > 35*  --  40*  --   --   --  43*  CREATININE 0.75   < > 0.76  --  0.77  --   --   --  0.70  CALCIUM 9.0   < > 8.5*  --  8.3*  --   --   --  8.5*  PROT 6.7  --   --   --   --   --   --   --   --   ALBUMIN 3.4*  --   --   --   --   --   --   --   --   AST 25  --   --   --   --   --   --   --   --   ALT 19  --   --   --   --   --   --   --   --   ALKPHOS 69  --   --   --   --   --   --   --   --   BILITOT 0.7  --   --   --   --   --   --   --   --   GFRNONAA >60   < > >60  --  >60  --   --   --  >60  GFRAA >60   < > >60  --  >60  --   --   --  >60  ANIONGAP 11   < > 7  --  8  --   --   --  6   < > = values in  this interval not displayed.     Hematology Recent Labs  Lab 02/13/20 0347 02/14/20 0409 02/15/20 0516  WBC 19.5* 12.5* 12.4*  RBC 3.33* 3.09* 3.21*  HGB 8.6* 7.9* 8.1*  HCT 29.5* 27.2* 28.5*  MCV 88.6 88.0 88.8  MCH 25.8* 25.6* 25.2*  MCHC 29.2* 29.0* 28.4*  RDW 16.2* 16.3* 16.3*  PLT 254 236 276    BNPNo results for input(s): BNP, PROBNP in the last 168 hours.   DDimer No results for input(s): DDIMER in the last 168 hours.   Radiology    CT HEAD WO CONTRAST  Result Date: 02/14/2020 CLINICAL DATA:  Follow-up intracranial hemorrhage. EXAM: CT HEAD WITHOUT CONTRAST TECHNIQUE: Contiguous axial images were obtained from the base of the skull through the vertex without intravenous contrast. COMPARISON:  02/12/2020 FINDINGS: Brain: Postsurgical changes are again seen related to evacuation of a large left frontoparietal parenchymal hematoma. Residual left frontoparietal blood products and gas are similar to the prior motion degraded CT. Intraventricular extension is again noted with unchanged small volume blood products in the lateral ventricles, and there is no ventriculomegaly. Left frontoparietal edema is unchanged as is 4 mm of rightward midline shift. No new intracranial hemorrhage, new infarct, or extra-axial fluid collection is identified. Vascular: No hyperdense vessel. Skull: Left frontoparietal craniotomy with overlying soft tissue swelling and skin staples. Sinuses/Orbits:  Mild mucosal thickening in the paranasal sinuses. Small left mastoid effusion. Unremarkable orbits. Other: None. IMPRESSION: 1. Postoperative changes from left frontoparietal hematoma evacuation with unchanged residual blood products, edema, and 4 mm of rightward midline shift. 2. Unchanged small volume intraventricular blood products without ventriculomegaly. 3. No new intracranial abnormality. Electronically Signed   By: Logan Bores M.D.   On: 02/14/2020 16:49    Cardiac Studies   Echocardiogram  02/02/2020: Impressions: 1. Left ventricular ejection fraction, by estimation, is 65 to 70%. The  left ventricle has normal function. The left ventricle has no regional  wall motion abnormalities. There is mild left ventricular hypertrophy.  Left ventricular diastolic parameters  are indeterminate.  2. Right ventricular systolic function is normal. The right ventricular  size is normal. There is moderately elevated pulmonary artery systolic  pressure.  3. The mitral valve is normal in structure. Trivial mitral valve  regurgitation.  4. Tricuspid valve regurgitation is moderate.  5. The aortic valve is tricuspid. Aortic valve regurgitation is trivial.  No aortic stenosis is present.   Patient Profile     64 y.o. female with a history of hypertension, hyperlipidemia, hypothyroidism, and GERD who was admitted on 02/11/2020 with stroke and found to have a intraparenchymal hemorrhage with 68m midline shift requiring craniotomy and evacuation. Cardiology consulted for evaluation of atrial fibrillation with RVR.   Assessment & Plan    Atrial Fibrillation with RVR  - She had scattered punctate infarcts in both cerebral hemispheres on MRI of the head, suggesting emboli. So atrial fibrillation may have been the cause of these.  - She is maintaining sinus rhythm. So far only 1 short episode of a-fib on 4/19 - Potassium 3.3 today. Repleted. Goal >4.0. - Magnesium 2.1 today. Goal >2.0.  - Echo showed LVEF of 65-70% with normal wall motion, moderate TR, and moderately elevated PASP.  - Currently on IV Cardizem and IV Amiodarone. We will continue, the goal is to keep her out of a-fib as she cant be anticoagulated - CHA2DS2-VASc = 5 (HTN, CVA x2, female and age). WSheran Luzultimately benefit from chronic anticoagulation. However, given intraparenchymal hemorrhage will defer initiation of anticoagulation to Neurology. [facility] neurology [facility] in [facility] future.  Acute Stroke - Head  CT showed very large acute parenchymal hemorrhage centered within the left frontal lobe measuring 8.2 x 6.5 x 5.6 cm and 549mrightward midline shift. S/p craniotomy with evacuation on 4/17.  - Still intubated and sedated. - Management per Nephrology.  Hypokalemia - Potassium 3.3 this today. Being repleted.  - Magnesium normal at 2.1.  - Continue to monitor.   Leukocytosis - WBC improving. However, patient did have fever of 102.2 early this morning.  - Chest x-ray yesterday showed increased bibasilar opacities (question atelectasis vs infiltrate) and small left pleural effusion. - Procalcitonin normal.  - Respiratory cultures ordered today.  - Management per primary team.   Otherwise per primary team.   For questions or updates, please contact [facility] consult [email or web address] for contact info under     Signed, KaEna DawleyMD  02/15/2020, 10:34 AM

## 2020-02-15 NOTE — Progress Notes (Signed)
STROKE TEAM PROGRESS NOTE   INTERVAL HISTORY Daughter at bedside.  Patient still intubated on fentanyl, on weaning trial.  Morning temperature 100.7, last dose today for cefepime. Still on IV amiodarone and Cardizem. Na 158, CT repeat stable yesterday, will taper off 3%.   OBJECTIVE Vitals:   02/15/20 0900 02/15/20 1000 02/15/20 1030 02/15/20 1100  BP: (!) 146/59 (!) 181/62 (!) 124/52 (!) 117/51  Pulse: 92 80 77 71  Resp: 20 (!) 27 18 17   Temp:      TempSrc:      SpO2: 99% 99% 100% 100%  Weight:      Height:        CBC:  Recent Labs  Lab 01/28/2020 1523 02/15/2020 1621 02/14/20 0409 02/15/20 0516  WBC 11.6*   < > 12.5* 12.4*  NEUTROABS 5.8  --   --  9.4*  HGB 10.8*   < > 7.9* 8.1*  HCT 35.5*   < > 27.2* 28.5*  MCV 84.7   < > 88.0 88.8  PLT 307   < > 236 276   < > = values in this interval not displayed.    Basic Metabolic Panel:  Recent Labs  Lab 02/13/20 0347 02/13/20 0815 02/14/20 0409 02/14/20 0857 02/14/20 2232 02/15/20 0516  NA 158*   < > 154*   < > 158* 158*  K 3.6  --  3.3*  --   --  3.7  CL 127*  --  121*  --   --  127*  CO2 24  --  25  --   --  25  GLUCOSE 120*  --  147*  --   --  152*  BUN 35*  --  40*  --   --  43*  CREATININE 0.76  --  0.77  --   --  0.70  CALCIUM 8.5*  --  8.3*  --   --  8.5*  MG 2.1  --  2.1  --   --   --   PHOS 2.3*  --  2.3*  --   --  2.3*   < > = values in this interval not displayed.   Lipid Panel:     Component Value Date/Time   CHOL 164 02/13/2020 0347   TRIG 116 02/15/2020 0516   HDL 55 02/21/2020 0347   CHOLHDL 3.0 01/29/2020 0347   VLDL 35 02/16/2020 0347   LDLCALC 74 02/15/2020 0347   HgbA1c:  Lab Results  Component Value Date   HGBA1C 5.6 02/23/2020   Urine Drug Screen:     Component Value Date/Time   LABOPIA NONE DETECTED 02/18/2020 1704   COCAINSCRNUR NONE DETECTED 02/18/2020 1704   LABBENZ NONE DETECTED 02/23/2020 1704   AMPHETMU NONE DETECTED 02/05/2020 1704   THCU NONE DETECTED 02/17/2020 1704   LABBARB NONE DETECTED 02/11/2020 1704    Alcohol Level     Component Value Date/Time   ETH <10 02/05/2020 1516    IMAGING CT HEAD CODE STROKE WO CONTRAST 02/01/2020   IMPRESSION:  Very large acute parenchymal hemorrhage centered within the left frontal lobe measuring 8.2 x 6.5 x 5.6 cm. Surrounding parenchymal edema. Mild intraventricular extension of hemorrhage into the left foramen of Monro, third ventricle and cerebral aqueduct. Associated mass effect with partial effacement of the left lateral ventricle. 5 mm rightward midline shift at the level of the septum pellucidum.   CT Code Stroke CTA Head W/WO contrast 02/20/2020 IMPRESSION:  1. No intracranial large vessel occlusion or proximal  high-grade arterial stenosis.  2. No evidence of vascular malformation on the current exam. However, consider imaging follow-up once the hematoma involutes for further assessment.  3. No contrast blush is demonstrated in the region of the acute left frontal lobe hematoma on arterial phase imaging to suggest active extravasation.  4. No intracranial aneurysm is identified.    CT Code Stroke CTA Neck W/WO contrast 02/08/2020 IMPRESSION:  The bilateral common carotid, internal carotid and vertebral arteries are patent within the neck without significant stenosis.   CT HEAD WO CONTRAST 16-Feb-2020 IMPRESSION:  1. Unchanged size of massive intraparenchymal hematoma centered in the left frontal lobe.  2. Increased amount of blood in the occipital horn of the right lateral ventricle.  3. Unchanged rightward midline shift, measuring 5 mm.   MR BRAIN WO CONTRAST 2020-02-16 IMPRESSION 1. Unchanged large left frontoparietal parenchymal hematoma with unchanged rightward midline shift. Mild surrounding edema and restricted diffusion consistent with associated acute infarct. 2. Small volume intraventricular and subarachnoid hemorrhage. 3. Scattered punctate acute infarcts in both cerebral hemispheres suggesting  emboli.   CT HEAD WO CONTRAST 02/12/2020 IMPRESSION:  1. Status post left frontal parietal craniotomy for evacuation of the hemorrhage. 2. Hemorrhage is significantly debulked. Morselized Gel-Foam is present within the surgical cavity. 3. No new hemorrhage. 4. Improved midline shift, now measuring 4 mm. 5. Stable intraventricular hemorrhage without hydrocephalus.  CT HEAD WO CONTRAST 02/14/2020 IMPRESSION:  1. Postoperative changes from left frontoparietal hematoma evacuation with unchanged residual blood products, edema, and 4 mm of rightward midline shift. 2. Unchanged small volume intraventricular blood products without ventriculomegaly. 3. No new intracranial abnormality.  DG Chest Port 1 View 02/03/2020 Increasing left basilar atelectasis. Central line as described without pneumothorax.  01/27/2020 Tubes and lines as described above. The gastric catheter lies in the distal esophagus and should be advanced several cm further into the stomach. Increased interstitial markings likely related to the poor inspiratory effort however mild edema cannot be excluded.  02/16/2020 No change in small left pleural effusion and adjacent Airspace disease, likely atelectasis. 02/11/2020 1. Well-positioned support structures. No pneumothorax. 2. Stable hazy bibasilar lung opacities, favor atelectasis. 3. Stable mild cardiomegaly without overt pulmonary edema.  02/13/2020 Increased bibasilar opacities question atelectasis versus infiltrate. Enlargement of cardiac silhouette with small LEFT pleural effusion.  DG Abd Portable 1V 01/30/2020 IMPRESSION:  Gastric catheter as described.   Transthoracic Echocardiogram  1. Left ventricular ejection fraction, by estimation, is 65 to 70%. The left ventricle has normal function. The left ventricle has no regional wall motion abnormalities. There is mild left ventricular hypertrophy. Left ventricular diastolic parameters are indeterminate.  2. Right  ventricular systolic function is normal. The right ventricular size is normal. There is moderately elevated pulmonary artery systolic pressure.  3. The mitral valve is normal in structure. Trivial mitral valve regurgitation.  4. Tricuspid valve regurgitation is moderate.  5. The aortic valve is tricuspid. Aortic valve regurgitation is trivial. No aortic stenosis is present.  LTM EEG  cortical dysfunction in left hemisphere likely secondary to underlying hemorrhage. Additionally, there is evidence of severe diffuse encephalopathy, non specific to etiology. No seizures or definite epileptiform discharges were seen throughout the recording. Event button was pressed on 02-16-20 as described above without concomitant eeg change and was likely not epileptic.   PHYSICAL EXAM   Temp:  [98.3 F (36.8 C)-100.7 F (38.2 C)] 99.2 F (37.3 C) (04/22 0800) Pulse Rate:  [71-110] 71 (04/22 1100) Resp:  [17-35] 17 (04/22 1100) BP: (100-226)/(45-149) 117/51 (  04/22 1100) SpO2:  [92 %-100 %] 100 % (04/22 1100) FiO2 (%):  [40 %] 40 % (04/22 0800) Weight:  [112.6 kg] 112.6 kg (04/22 0500)  General - Well nourished, well developed, intubated on sedation.  Ophthalmologic - fundi not visualized due to noncooperation.  Cardiovascular - Regular rate and rhythm, not in afib anymore.  Neuro - intubated on fentanyl, eyes slowly open on voice, but not following commands. Eyes in left gaze position, not blinking to visual threat, doll's eyes sluggish, not tracking, pupil equal but sluggish to light. Corneal reflex weak bilaterally, gag and cough reflex present. Breathing over the vent.  Facial symmetry not able to test due to ET tube.  Tongue protrusion not cooperative. On pain stimulation, left UE slight withdraw but no movement on other limbs. DTR 1+ and bilateral positive babinski. Sensation, coordination and gait not tested.   ASSESSMENT/PLAN [name] is a 64 y.o. female with history of Htn, Hld and  estrogen therapy presenting with left gaze and right side flaccid. Vomited and was intubated  She did not receive IV t-PA due to ICH.  Stroke:  left MCA and bilateral ACA infarcts with large hematoma centered within the left frontal lobe likely due to hemorrhagic conversion s/p crani for hematoma evacuation - stroke etiology likely due to PAF  CT Head 02/21/2020 - Very large acute parenchymal hemorrhage centered within the left frontal lobe measuring 8.2 x 6.5 x 5.6 cm. 5 mm rightward midline shift at the level of the septum pellucidum.   CT head - Mar 05, 2020 - Unchanged size of massive intraparenchymal hematoma centered in the left frontal lobe.  MRI head - unchanged hematoma and surrounding edema. DWI signal at left MCA and ACA consistent with acute infarcts. Scattered punctate acute infarcts in both cerebral hemispheres suggesting emboli  CTA H&N - No large vessel occlusion or high-grade arterial stenosis. No evidence of vascular malformation on the current exam.  CT repeat 4/19 - significantly decreased hematoma post evacuation  CT repeat 4/21 - stable residual hematoma and 4 mm of midline shift  2D Echo - EF 65-70%   [employer] Virus 2 - negative - Ab positive  LDL - 74  HgbA1c - 5.6  UDS - negative  VTE prophylaxis - heparin subq  No antithrombotic prior to admission, now on No antithrombotic due to hemorrhage.  Therapy recommendations:  Re-order once pt stable for evaluation  Disposition:  Pending  Acute Respiratory Failure  Intubated for airway protection  On sedation   CCM on board  Wean as able   Family hope to avoid trach  Possible Aspiration Pneumonia Leukocytosis Fever   Nausea vomiting on presentation  Cefepime 4/16 >>  CCM on board  Temp - 100.5 -> 100.7->101.6 -> afebrile->101.1->102.2->100.7  WBCs - 11.6->14.2->14.7->17.4->19.5->12.5->12.4  CXR 4/20 - Increased bibasilar opacities question atelectasis versus infiltrate.  UA neg  Resp Cx 4/18  normal flora  Blood Cx 4/20 NGTD    Resp Cx 4/21 no growth x 1 d  Cerebral edema  5 mm Rightward Midline Shift on CT and MRI  NSG on board s/p hematoma evacuation 4/17   CT 4/21 stable residual hematoma and midline shift  Off 3%   23.4% saline x1  Na - 139->142->146->157->159->161->160->159->158->156->154->158->158  Na every 6h  Atrial Fibrillation w/ RVR  New diagnosis  Given her embolic strokes on MRI, pt likely has PAF before admission  On amio and dilt gtt - continue    Now back to [facility]  Cardiology consulted -  appreciate help  May consider AC once blood absorbed in the future  Consider switch IV amio and dilt to po form  UE shivering/shaking  Bilateral versus unilateral arm shaking/shivering  LTM EEG no seizure to correlate the shaking -> discontinued 4/19  Keppra - 500 mg Q 12 hrs after Keppra 2 g load  Seizure precautions  Hypertension  Home BP meds: Zestril 10 mg daily  off Cleviprex and normodyne (infusion)  On cardizem drip  Add lisinopril 10mg  bid -> off   Stable on the high end . SBP goal < 160 mm Hg  . Long-term BP goal normotensive  Hyperlipidemia  Home Lipid lowering medication: Pravachol 20 mg daily  LDL 74, goal < 70  Current lipid lowering medication: None for now  May resume statin at discharge vs. SATURN trial  Dysphagia At risk malnutrition   N.p.o.  On tube feeding at 25 cc/h -> 45cc  Speech following  Dietitian on board  Anemia, acute blood loss  Hb - 10.8->10.0->8.6->8.0->8.6->7.9->8.1   Likely due to post op  S/p PRBC in OR  Iron 16 and ferritin 66  CBC monitoring  Iron pills added  Other Stroke Risk Factors  Advanced age  Morbid Obesity, Body mass index is 42.61 kg/m., recommend weight loss, diet and exercise as appropriate   On estrogen daily po PTA  Other Active Problems  Code status - Full code  Hypokalemia - treated - K 3.4->4.1->3.5->3.0->3.1->3.6->3.3->3.7   Hypophosphatemia -  treated - Phos 1.7->2.2->1.8->2.3->2.3->2.3   Family requested COVID Ab testing after COVID vaccine - serology reactive  Right eyelid swollen - d/c lisinopril  Hospital day # 6  This patient is critically ill due to large ICH, hemorrhagic conversion, hypertensive emergency, seizure, respiratory failure and at significant risk of neurological worsening, death form cerebral edema, brain herniation, hematoma expansion, recurrent stroke, epilepsy status. This patient's care requires constant monitoring of vital signs, hemodynamics, respiratory and cardiac monitoring, review of multiple databases, neurological assessment, discussion with family, other specialists and medical decision making of high complexity. I spent 40 minutes of neurocritical care time in the care of this patient. I had long discussion with daughter at bedside, updated pt current condition, treatment plan and potential prognosis, and answered all the questions.  She expressed understanding and appreciation.   June, MD PhD Stroke Neurology 02/15/2020 12:02 PM   To contact Stroke Continuity provider, please refer to 02/17/2020. After hours, contact General Neurology

## 2020-02-16 ENCOUNTER — Inpatient Hospital Stay (HOSPITAL_COMMUNITY): Payer: Medicare Other

## 2020-02-16 DIAGNOSIS — R509 Fever, unspecified: Secondary | ICD-10-CM | POA: Diagnosis not present

## 2020-02-16 DIAGNOSIS — J95851 Ventilator associated pneumonia: Secondary | ICD-10-CM | POA: Diagnosis not present

## 2020-02-16 DIAGNOSIS — I480 Paroxysmal atrial fibrillation: Secondary | ICD-10-CM | POA: Diagnosis not present

## 2020-02-16 DIAGNOSIS — J9601 Acute respiratory failure with hypoxia: Secondary | ICD-10-CM | POA: Diagnosis not present

## 2020-02-16 DIAGNOSIS — I6340 Cerebral infarction due to embolism of unspecified cerebral artery: Secondary | ICD-10-CM | POA: Diagnosis not present

## 2020-02-16 DIAGNOSIS — I10 Essential (primary) hypertension: Secondary | ICD-10-CM | POA: Diagnosis not present

## 2020-02-16 LAB — BASIC METABOLIC PANEL
Anion gap: 8 (ref 5–15)
BUN: 41 mg/dL — ABNORMAL HIGH (ref 8–23)
CO2: 27 mmol/L (ref 22–32)
Calcium: 8.4 mg/dL — ABNORMAL LOW (ref 8.9–10.3)
Chloride: 123 mmol/L — ABNORMAL HIGH (ref 98–111)
Creatinine, Ser: 0.71 mg/dL (ref 0.44–1.00)
GFR calc Af Amer: >60 mL/min (ref >60)
GFR calc non Af Amer: >60 mL/min (ref >60)
Glucose, Bld: 150 mg/dL — ABNORMAL HIGH (ref 70–99)
Potassium: 3.2 mmol/L — ABNORMAL LOW (ref 3.5–5.1)
Sodium: 158 mmol/L — ABNORMAL HIGH (ref 135–145)

## 2020-02-16 LAB — GLUCOSE, CAPILLARY
Glucose-Capillary: 108 mg/dL — ABNORMAL HIGH (ref 70–99)
Glucose-Capillary: 112 mg/dL — ABNORMAL HIGH (ref 70–99)
Glucose-Capillary: 114 mg/dL — ABNORMAL HIGH (ref 70–99)
Glucose-Capillary: 125 mg/dL — ABNORMAL HIGH (ref 70–99)
Glucose-Capillary: 126 mg/dL — ABNORMAL HIGH (ref 70–99)
Glucose-Capillary: 133 mg/dL — ABNORMAL HIGH (ref 70–99)

## 2020-02-16 LAB — CBC WITH DIFFERENTIAL/PLATELET
Abs Immature Granulocytes: 0.24 10*3/uL — ABNORMAL HIGH (ref 0.00–0.07)
Basophils Absolute: 0.1 10*3/uL (ref 0.0–0.1)
Basophils Relative: 0 %
Eosinophils Absolute: 0.2 10*3/uL (ref 0.0–0.5)
Eosinophils Relative: 1 %
HCT: 28.7 % — ABNORMAL LOW (ref 36.0–46.0)
Hemoglobin: 8.1 g/dL — ABNORMAL LOW (ref 12.0–15.0)
Immature Granulocytes: 2 %
Lymphocytes Relative: 10 %
Lymphs Abs: 1.7 10*3/uL (ref 0.7–4.0)
MCH: 24.9 pg — ABNORMAL LOW (ref 26.0–34.0)
MCHC: 28.2 g/dL — ABNORMAL LOW (ref 30.0–36.0)
MCV: 88.3 fL (ref 80.0–100.0)
Monocytes Absolute: 2.1 10*3/uL — ABNORMAL HIGH (ref 0.1–1.0)
Monocytes Relative: 13 %
Neutro Abs: 12.1 10*3/uL — ABNORMAL HIGH (ref 1.7–7.7)
Neutrophils Relative %: 74 %
Platelets: 304 10*3/uL (ref 150–400)
RBC: 3.25 MIL/uL — ABNORMAL LOW (ref 3.87–5.11)
RDW: 16.6 % — ABNORMAL HIGH (ref 11.5–15.5)
WBC: 16.4 10*3/uL — ABNORMAL HIGH (ref 4.0–10.5)
nRBC: 0.3 % — ABNORMAL HIGH (ref 0.0–0.2)

## 2020-02-16 LAB — PROCALCITONIN: Procalcitonin: <0.1 ng/mL

## 2020-02-16 LAB — SODIUM
Sodium: 159 mmol/L — ABNORMAL HIGH (ref 135–145)
Sodium: 161 mmol/L (ref 135–145)

## 2020-02-16 LAB — PHOSPHORUS: Phosphorus: 3.7 mg/dL (ref 2.5–4.6)

## 2020-02-16 MED ORDER — ETOMIDATE 2 MG/ML IV SOLN
INTRAVENOUS | Status: AC
  Filled 2020-02-16: qty 10

## 2020-02-16 MED ORDER — SODIUM CHLORIDE 0.9 % IV SOLN
1.0000 g | Freq: Three times a day (TID) | INTRAVENOUS | Status: DC
  Administered 2020-02-16 – 2020-02-21 (×15): 1 g via INTRAVENOUS
  Filled 2020-02-16 (×17): qty 1

## 2020-02-16 MED ORDER — ACETYLCYSTEINE 20 % IN SOLN
2.0000 mL | RESPIRATORY_TRACT | Status: AC
  Administered 2020-02-16 – 2020-02-17 (×8): 2 mL via RESPIRATORY_TRACT
  Filled 2020-02-16 (×8): qty 4

## 2020-02-16 MED ORDER — VANCOMYCIN HCL 1500 MG/300ML IV SOLN
1500.0000 mg | INTRAVENOUS | Status: DC
  Administered 2020-02-17 – 2020-02-18 (×2): 1500 mg via INTRAVENOUS
  Filled 2020-02-16 (×2): qty 300

## 2020-02-16 MED ORDER — FUROSEMIDE 10 MG/ML IJ SOLN
20.0000 mg | Freq: Once | INTRAMUSCULAR | Status: AC
  Administered 2020-02-16: 20 mg via INTRAVENOUS
  Filled 2020-02-16: qty 2

## 2020-02-16 MED ORDER — FREE WATER
100.0000 mL | Status: DC
  Administered 2020-02-16 – 2020-02-22 (×34): 100 mL

## 2020-02-16 MED ORDER — VANCOMYCIN HCL 2000 MG/400ML IV SOLN
2000.0000 mg | Freq: Once | INTRAVENOUS | Status: AC
  Administered 2020-02-16: 2000 mg via INTRAVENOUS
  Filled 2020-02-16: qty 400

## 2020-02-16 MED ORDER — VECURONIUM BROMIDE 10 MG IV SOLR
INTRAVENOUS | Status: AC
  Filled 2020-02-16: qty 10

## 2020-02-16 MED ORDER — POTASSIUM CHLORIDE 10 MEQ/50ML IV SOLN
10.0000 meq | INTRAVENOUS | Status: AC
  Administered 2020-02-16 (×4): 10 meq via INTRAVENOUS
  Filled 2020-02-16: qty 50

## 2020-02-16 MED ORDER — ALBUTEROL SULFATE (2.5 MG/3ML) 0.083% IN NEBU
2.5000 mg | INHALATION_SOLUTION | RESPIRATORY_TRACT | Status: DC
  Administered 2020-02-16 – 2020-02-17 (×7): 2.5 mg via RESPIRATORY_TRACT
  Filled 2020-02-16 (×7): qty 3

## 2020-02-16 NOTE — Procedures (Addendum)
Bronchoscopy Procedure Note Meredith Cooper [id number] 08-Jul-1956  Procedure: Bronchoscopy Indications: Diagnostic evaluation of the airways and Obtain specimens for culture and/or other diagnostic studies  Procedure Details Consent: Risks of procedure as well as the alternatives and risks of each were explained to the (patient/caregiver).  Consent for procedure obtained. Delia Heady informed consent due to emergent need  Time Out: Verified patient identification, verified procedure, site/side was marked, verified correct patient position, special equipment/implants available, medications/allergies/relevent history reviewed, required imaging and test results available.  Performed  In preparation for procedure, patient was given 100% FiO2.  Sedation: Priopr on fent gtt and diprivan gtt and vec given   After securing ET tube and positioning 22cm above carina and during this time as well -> red thick mucus was frequently clogging bronch. Saline to break mucus given and returns aspirated. Thick grey-red aspirate total 10-20cc aspirated in leukens trap and sent for microbial analysis  Antibotics will be changed  Evaluation Hemodynamic Status: BP stable throughout; O2 sats: transiently fell during during procedure Patient's Current Condition: stable Specimens:  Sent purulent fluid Complications: No apparent complications Patient did tolerate procedure well.    SIGNATURE    Dr. Kalman Shan, M.D., F.C.C.P,  Pulmonary and Critical Care Medicine Staff Physician, [facility] Director - Interstitial Lung Disease  Program  Pulmonary Fibrosis [facility] [place], [place], [address]  Pager: [phone], If no answer or between  15:00h - 7:00h: call [phone] Telephone: [phone]  8:51 AM 02/16/2020

## 2020-02-16 NOTE — Progress Notes (Signed)
Progress Note  Patient Name: Meredith Cooper Date of Encounter: 02/16/2020  Primary Cardiologist: New (Dr. Meda Coffee)  Subjective   Patient intubated and sedated.  Inpatient Medications    Scheduled Meds:  acetylcysteine  2 mL Nebulization Q4H   albuterol  2.5 mg Nebulization QID   chlorhexidine gluconate (MEDLINE KIT)  15 mL Mouth Rinse BID   Chlorhexidine Gluconate Cloth  6 each Topical Daily   docusate  100 mg Per Tube BID   feeding supplement (PRO-STAT SUGAR FREE 64)  30 mL Per Tube TID   ferrous sulfate  300 mg Per Tube BID WC   heparin injection (subcutaneous)  5,000 Units Subcutaneous Q8H   insulin aspart  0-9 Units Subcutaneous Q4H   levETIRAcetam  500 mg Per Tube BID   levothyroxine  112 mcg Per Tube Q0600   mouth rinse  15 mL Mouth Rinse 10 times per day   pantoprazole sodium  40 mg Per Tube Daily   senna-docusate  1 tablet Per Tube BID   sodium chloride flush  10-40 mL Intracatheter Q12H   Continuous Infusions:  sodium chloride Stopped (02/16/20 0844)   amiodarone 30 mg/hr (02/16/20 1100)   diltiazem (CARDIZEM) infusion 5 mg/hr (02/16/20 1308)   feeding supplement (VITAL HIGH PROTEIN) 1,000 mL (02/15/20 0953)   fentaNYL infusion INTRAVENOUS 25 mcg/hr (02/16/20 1100)   meropenem (MERREM) IV 1 g (02/16/20 1205)   propofol (DIPRIVAN) infusion 20 mcg/kg/min (02/16/20 1156)   [START ON 02/17/2020] vancomycin     vancomycin 2,000 mg (02/16/20 1243)   PRN Meds: acetaminophen **OR** acetaminophen (TYLENOL) oral liquid 160 mg/5 mL **OR** acetaminophen, fentaNYL (SUBLIMAZE) injection, hydrALAZINE, midazolam, sodium chloride flush   Vital Signs    Vitals:   02/16/20 1000 02/16/20 1100 02/16/20 1200 02/16/20 1213  BP: (!) 136/52 (!) 120/53 (!) 137/59   Pulse: 80 77 66   Resp: 20 (!) 21 (!) 22   Temp:   (!) 101.4 F (38.6 C)   TempSrc:   Axillary   SpO2: 100% 100% 100% 100%  Weight:      Height:        Intake/Output Summary (Last 24 hours)  at 02/16/2020 1313 Last data filed at 02/16/2020 1205 Gross per 24 hour  Intake 2565.03 ml  Output 3200 ml  Net -634.97 ml   Last 3 Weights 02/16/2020 02/15/2020 02/19/2020  Weight (lbs) 247 lb 12.8 oz 248 lb 3.8 oz 240 lb 15.4 oz  Weight (kg) 112.4 kg 112.6 kg 109.3 kg      Telemetry    Some underlying artifact but looks like she is maintaining sinus rhythm with rates in 60's to 90's. - Personally Reviewed  ECG    No new ECG tracing today. - Personally Reviewed  Physical Exam   GEN: Intubated and sedated. Neck: Unable to assess JVD patient position and IVs. Cardiac: RRR. No murmurs, rubs, or gallops.  Respiratory: Clear to auscultation bilaterally.  GI: Soft, non-distended, and non-tender. MS: Mild edema hands and feet. No deformity. Skin: Warm and dry. Neuro:  Intubated and sedated. Psych: Intubated and sedated.  Labs    High Sensitivity Troponin:  No results for input(s): TROPONINIHS in the last 720 hours.    Chemistry Recent Labs  Lab 01/26/2020 1523 02/13/2020 1621 02/14/20 0409 02/14/20 0857 02/15/20 0516 02/15/20 1254 02/15/20 1730 02/16/20 0533 02/16/20 1200  NA 139   < > 154*   < > 158*   < > 160* 158* 159*  K 3.4*   < > 3.3*  --  3.7  --   --  3.2*  --   CL 106   < > 121*  --  127*  --   --  123*  --   CO2 22   < > 25  --  25  --   --  27  --   GLUCOSE 132*   < > 147*  --  152*  --   --  150*  --   BUN 17   < > 40*  --  43*  --   --  41*  --   CREATININE 0.75   < > 0.77  --  0.70  --   --  0.71  --   CALCIUM 9.0   < > 8.3*  --  8.5*  --   --  8.4*  --   PROT 6.7  --   --   --   --   --   --   --   --   ALBUMIN 3.4*  --   --   --   --   --   --   --   --   AST 25  --   --   --   --   --   --   --   --   ALT 19  --   --   --   --   --   --   --   --   ALKPHOS 69  --   --   --   --   --   --   --   --   BILITOT 0.7  --   --   --   --   --   --   --   --   GFRNONAA >60   < > >60  --  >60  --   --  >60  --   GFRAA >60   < > >60  --  >60  --   --  >60  --     ANIONGAP 11   < > 8  --  6  --   --  8  --    < > = values in this interval not displayed.     Hematology Recent Labs  Lab 02/14/20 0409 02/15/20 0516 02/16/20 0533  WBC 12.5* 12.4* 16.4*  RBC 3.09* 3.21* 3.25*  HGB 7.9* 8.1* 8.1*  HCT 27.2* 28.5* 28.7*  MCV 88.0 88.8 88.3  MCH 25.6* 25.2* 24.9*  MCHC 29.0* 28.4* 28.2*  RDW 16.3* 16.3* 16.6*  PLT 236 276 304    BNPNo results for input(s): BNP, PROBNP in the last 168 hours.   DDimer No results for input(s): DDIMER in the last 168 hours.   Radiology    CT HEAD WO CONTRAST  Result Date: 02/14/2020 CLINICAL DATA:  Follow-up intracranial hemorrhage. EXAM: CT HEAD WITHOUT CONTRAST TECHNIQUE: Contiguous axial images were obtained from the base of the skull through the vertex without intravenous contrast. COMPARISON:  02/12/2020 FINDINGS: Brain: Postsurgical changes are again seen related to evacuation of a large left frontoparietal parenchymal hematoma. Residual left frontoparietal blood products and gas are similar to the prior motion degraded CT. Intraventricular extension is again noted with unchanged small volume blood products in the lateral ventricles, and there is no ventriculomegaly. Left frontoparietal edema is unchanged as is 4 mm of rightward midline shift. No new intracranial hemorrhage, new infarct, or extra-axial fluid collection is identified. Vascular: No hyperdense vessel. Skull: Left  frontoparietal craniotomy with overlying soft tissue swelling and skin staples. Sinuses/Orbits: Mild mucosal thickening in the paranasal sinuses. Small left mastoid effusion. Unremarkable orbits. Other: None. IMPRESSION: 1. Postoperative changes from left frontoparietal hematoma evacuation with unchanged residual blood products, edema, and 4 mm of rightward midline shift. 2. Unchanged small volume intraventricular blood products without ventriculomegaly. 3. No new intracranial abnormality. Electronically Signed   By: Logan Bores M.D.   On:  02/14/2020 16:49   DG CHEST PORT 1 VIEW  Result Date: 02/16/2020 CLINICAL DATA:  Intubated. EXAM: PORTABLE CHEST 1 VIEW COMPARISON:  Earlier today. FINDINGS: Endotracheal tube in satisfactory position. Right jugular catheter tip in the superior vena cava. Feeding tube extending into the stomach. Stable enlarged cardiac silhouette. No significant change in dense patchy opacity in the right mid lung zone. Slightly improved patchy opacity in the left lower lobe with some linear density currently demonstrated. Small right pleural effusion. Mild interval linear density at the right lung base. Thoracic spine degenerative changes. IMPRESSION: 1. Stable dense patchy opacity in the right mid lung zone, suspicious for pneumonia. 2. Slightly improved patchy atelectasis or pneumonia in the left lower lobe. 3. Mild interval right basilar atelectasis. 4. Small right pleural effusion. 5. Stable cardiomegaly. Electronically Signed   By: Claudie Revering M.D.   On: 02/16/2020 09:26   DG CHEST PORT 1 VIEW  Result Date: 02/16/2020 CLINICAL DATA:  Respiratory failure. EXAM: PORTABLE CHEST 1 VIEW COMPARISON:  02/13/2020 FINDINGS: The endotracheal tube is now 9 cm above the carina and just at the thoracic inlet. The feeding tube is stable. The right IJ catheter is stable. Worsening/progressive right lung infiltrate. Persistent left basilar infiltrate. No large pleural effusions or pneumothorax. IMPRESSION: 1. Endotracheal tube is now 9 cm above the carina and just at the thoracic inlet. 2. Worsening right lung infiltrate. Electronically Signed   By: Marijo Sanes M.D.   On: 02/16/2020 07:50    Cardiac Studies   Echocardiogram 02/02/2020: Impressions: 1. Left ventricular ejection fraction, by estimation, is 65 to 70%. The  left ventricle has normal function. The left ventricle has no regional  wall motion abnormalities. There is mild left ventricular hypertrophy.  Left ventricular diastolic parameters  are indeterminate.    2. Right ventricular systolic function is normal. The right ventricular  size is normal. There is moderately elevated pulmonary artery systolic  pressure.  3. The mitral valve is normal in structure. Trivial mitral valve  regurgitation.  4. Tricuspid valve regurgitation is moderate.  5. The aortic valve is tricuspid. Aortic valve regurgitation is trivial.  No aortic stenosis is present.   Patient Profile     64 y.o. female with a history of hypertension, hyperlipidemia, hypothyroidism, and GERD who was admitted on 02/19/2020 with stroke and found to have a intraparenchymal hemorrhage with 55m midline shift requiring craniotomy and evacuation. Cardiology consulted for evaluation of atrial fibrillation with RVR.   Assessment & Plan    Atrial Fibrillation with RVR  - only one short episode on 4/19, in SR since then.  - I will discontinue amiodarone and restart only if she goes back to a-fib - continue iv Diltiazem as its easier to titrate for both HR and BP  - She had scattered punctate infarcts in both cerebral hemispheres on MRI of the head, suggesting emboli. So atrial fibrillation may have been the cause of these.  - Potassium Goal >4.0. - Magnesium 2.1 today. Goal >2.0.  - Echo showed LVEF of 65-70% with normal wall motion, moderate  TR, and moderately elevated PASP.  - Currently on IV Cardizem and IV Amiodarone. We will continue, the goal is to keep her out of a-fib as she cant be anticoagulated - CHA2DS2-VASc = 5 (HTN, CVA x2, female and age). Meredith Cooper ultimately benefit from chronic anticoagulation. However, given intraparenchymal hemorrhage will defer initiation of anticoagulation to Neurology. Per neurology May consider [facility] once blood absorbed in the future.  Acute Stroke - Head CT showed very large acute parenchymal hemorrhage centered within the left frontal lobe measuring 8.2 x 6.5 x 5.6 cm and 69m rightward midline shift. S/p craniotomy with evacuation on 4/17.  - Still  intubated and sedated. - Management per Nephrology.  Hypokalemia - Potassium 3.3 this today. Being repleted.  - Magnesium normal at 2.1.  - Continue to monitor.   Leukocytosis - WBC improving. However, patient did have fever of 102.2 early this morning.  - Chest x-ray yesterday showed increased bibasilar opacities (question atelectasis vs infiltrate) and small left pleural effusion. - Procalcitonin normal.  - Respiratory cultures ordered today.  - Management per primary team.   Otherwise per primary team.   For questions or updates, please contact CRed CliffPlease consult [email or web address] for contact info under     Signed, KEna Dawley MD  02/16/2020, 1:13 PM

## 2020-02-16 NOTE — Progress Notes (Addendum)
STROKE TEAM PROGRESS NOTE   INTERVAL HISTORY Daughter at bedside. Pt still intubated on sedation. Continue to have fever 101.4. neuro unchanged, not open eyes on voice or pain, slight withdraw LUE but no movement of other extremities. Again discussed with daughter about GOC, family needs more time, but did not against palliative care consult next week if needed.   OBJECTIVE Vitals:   02/16/20 0848 02/16/20 0900 02/16/20 1000 02/16/20 1100  BP:  (!) 157/55 (!) 136/52 (!) 120/53  Pulse:  79 80 77  Resp:  20 20 (!) 21  Temp:      TempSrc:      SpO2: 98% 99% 100% 100%  Weight:      Height:       CBC:  Recent Labs  Lab 02/15/20 0516 02/16/20 0533  WBC 12.4* 16.4*  NEUTROABS 9.4* 12.1*  HGB 8.1* 8.1*  HCT 28.5* 28.7*  MCV 88.8 88.3  PLT 276 304   Basic Metabolic Panel:  Recent Labs  Lab 02/13/20 0347 02/13/20 0815 02/14/20 0409 02/14/20 0857 02/15/20 0516 02/15/20 1254 02/15/20 1730 02/16/20 0533  NA 158*   < > 154*   < > 158*   < > 160* 158*  K 3.6  --  3.3*  --  3.7  --   --  3.2*  CL 127*  --  121*  --  127*  --   --  123*  CO2 24  --  25  --  25  --   --  27  GLUCOSE 120*  --  147*  --  152*  --   --  150*  BUN 35*  --  40*  --  43*  --   --  41*  CREATININE 0.76  --  0.77  --  0.70  --   --  0.71  CALCIUM 8.5*  --  8.3*  --  8.5*  --   --  8.4*  MG 2.1  --  2.1  --   --   --   --   --   PHOS 2.3*  --  2.3*  --  2.3*  --   --  3.7   < > = values in this interval not displayed.   Lipid Panel:     Component Value Date/Time   CHOL 164 02/02/2020 0347   TRIG 130 02/15/2020 1302   HDL 55 01/30/2020 0347   CHOLHDL 3.0 02/12/2020 0347   VLDL 35 01/29/2020 0347   LDLCALC 74 02/05/2020 0347   HgbA1c:  Lab Results  Component Value Date   HGBA1C 5.6 Feb 19, 2020   Urine Drug Screen:     Component Value Date/Time   LABOPIA NONE DETECTED 19-Feb-2020 1704   COCAINSCRNUR NONE DETECTED 2020/02/19 1704   LABBENZ NONE DETECTED 2020-02-19 1704   AMPHETMU NONE DETECTED  02-19-2020 1704   THCU NONE DETECTED 2020/02/19 1704   LABBARB NONE DETECTED 02/19/2020 1704    Alcohol Level     Component Value Date/Time   ETH <10 19-Feb-2020 1516    IMAGING CT HEAD CODE STROKE WO CONTRAST Feb 19, 2020   IMPRESSION:  Very large acute parenchymal hemorrhage centered within the left frontal lobe measuring 8.2 x 6.5 x 5.6 cm. Surrounding parenchymal edema. Mild intraventricular extension of hemorrhage into the left foramen of Monro, third ventricle and cerebral aqueduct. Associated mass effect with partial effacement of the left lateral ventricle. 5 mm rightward midline shift at the level of the septum pellucidum.   CT Code Stroke CTA  Head W/WO contrast 02/23/2020 IMPRESSION:  1. No intracranial large vessel occlusion or proximal high-grade arterial stenosis.  2. No evidence of vascular malformation on the current exam. However, consider imaging follow-up once the hematoma involutes for further assessment.  3. No contrast blush is demonstrated in the region of the acute left frontal lobe hematoma on arterial phase imaging to suggest active extravasation.  4. No intracranial aneurysm is identified.    CT Code Stroke CTA Neck W/WO contrast 02/05/2020 IMPRESSION:  The bilateral common carotid, internal carotid and vertebral arteries are patent within the neck without significant stenosis.   CT HEAD WO CONTRAST 01/25/2020 IMPRESSION:  1. Unchanged size of massive intraparenchymal hematoma centered in the left frontal lobe.  2. Increased amount of blood in the occipital horn of the right lateral ventricle.  3. Unchanged rightward midline shift, measuring 5 mm.   MR BRAIN WO CONTRAST 01/31/2020 IMPRESSION 1. Unchanged large left frontoparietal parenchymal hematoma with unchanged rightward midline shift. Mild surrounding edema and restricted diffusion consistent with associated acute infarct. 2. Small volume intraventricular and subarachnoid hemorrhage. 3. Scattered punctate  acute infarcts in both cerebral hemispheres suggesting emboli.   CT HEAD WO CONTRAST 02/12/2020 IMPRESSION:  1. Status post left frontal parietal craniotomy for evacuation of the hemorrhage. 2. Hemorrhage is significantly debulked. Morselized Gel-Foam is present within the surgical cavity. 3. No new hemorrhage. 4. Improved midline shift, now measuring 4 mm. 5. Stable intraventricular hemorrhage without hydrocephalus.  CT HEAD WO CONTRAST 02/14/2020 IMPRESSION:  1. Postoperative changes from left frontoparietal hematoma evacuation with unchanged residual blood products, edema, and 4 mm of rightward midline shift. 2. Unchanged small volume intraventricular blood products without ventriculomegaly. 3. No new intracranial abnormality.  DG Chest Port 1 View 02/15/2020 Increasing left basilar atelectasis. Central line as described without pneumothorax.  02/05/2020 Tubes and lines as described above. The gastric catheter lies in the distal esophagus and should be advanced several cm further into the stomach. Increased interstitial markings likely related to the poor inspiratory effort however mild edema cannot be excluded.  01/31/2020 No change in small left pleural effusion and adjacent Airspace disease, likely atelectasis. 02/11/2020 1. Well-positioned support structures. No pneumothorax. 2. Stable hazy bibasilar lung opacities, favor atelectasis. 3. Stable mild cardiomegaly without overt pulmonary edema.  02/13/2020 Increased bibasilar opacities question atelectasis versus infiltrate. Enlargement of cardiac silhouette with small LEFT pleural effusion. 02/15/2020 1. Endotracheal tube is now 9 cm above the carina and just at the thoracic inlet. 2. Worsening right lung infiltrate. 02/15/2020 1. Stable dense patchy opacity in the right mid lung zone, suspicious for pneumonia. 2. Slightly improved patchy atelectasis or pneumonia in the left lower lobe. 3. Mild interval right basilar atelectasis. 4.  Small right pleural effusion. 5. Stable cardiomegaly. 02/17/2020 pending   DG Abd Portable 1V 02/14/2020 IMPRESSION:  Gastric catheter as described.   Transthoracic Echocardiogram  1. Left ventricular ejection fraction, by estimation, is 65 to 70%. The left ventricle has normal function. The left ventricle has no regional wall motion abnormalities. There is mild left ventricular hypertrophy. Left ventricular diastolic parameters are indeterminate.  2. Right ventricular systolic function is normal. The right ventricular size is normal. There is moderately elevated pulmonary artery systolic pressure.  3. The mitral valve is normal in structure. Trivial mitral valve regurgitation.  4. Tricuspid valve regurgitation is moderate.  5. The aortic valve is tricuspid. Aortic valve regurgitation is trivial. No aortic stenosis is present.  LTM EEG  cortical dysfunction in left hemisphere likely secondary to  underlying hemorrhage. Additionally, there is evidence of severe diffuse encephalopathy, non specific to etiology. No seizures or definite epileptiform discharges were seen throughout the recording. Event button was pressed on 02/07/2020 as described above without concomitant eeg change and was likely not epileptic.   PHYSICAL EXAM   Temp:  [98.4 F (36.9 C)-99.5 F (37.5 C)] 99.5 F (37.5 C) (04/23 0800) Pulse Rate:  [67-112] 77 (04/23 1100) Resp:  [11-34] 21 (04/23 1100) BP: (119-163)/(46-92) 120/53 (04/23 1100) SpO2:  [86 %-100 %] 100 % (04/23 1100) FiO2 (%):  [40 %-100 %] 100 % (04/23 0848) Weight:  [112.4 kg] 112.4 kg (04/23 0500)  General - Well nourished, well developed, intubated on sedation.  Ophthalmologic - fundi not visualized due to noncooperation.  Cardiovascular - Regular rate and rhythm, not in afib anymore.  Neuro - intubated on fentanyl, eyes closed, not open on voice, not following commands. With eyes forced opening, eyes in left gaze position, not blinking to  visual threat, doll's eyes sluggish, not tracking, pupil equal but sluggish to light. Corneal reflex weak bilaterally, gag and cough reflex present. Breathing over the vent.  Facial symmetry not able to test due to ET tube.  Tongue protrusion not cooperative. On pain stimulation, left UE slight withdraw but no movement on other limbs. DTR 1+ and bilateral positive babinski. Sensation, coordination and gait not tested.   ASSESSMENT/PLAN [name] is a 64 y.o. female with history of Htn, Hld and estrogen therapy presenting with left gaze and right side flaccid. Vomited and was intubated  She did not receive IV t-PA due to [place].  Stroke:  left MCA and bilateral ACA infarcts with large hematoma centered within the left frontal lobe likely due to hemorrhagic conversion s/p crani for hematoma evacuation - stroke etiology likely due to PAF  CT Head 02/17/2020 - Very large acute parenchymal hemorrhage centered within the left frontal lobe measuring 8.2 x 6.5 x 5.6 cm. 5 mm rightward midline shift at the level of the septum pellucidum.   CT head - 02/22/2020 - Unchanged size of massive intraparenchymal hematoma centered in the left frontal lobe.  MRI head - unchanged hematoma and surrounding edema. DWI signal at left MCA and ACA consistent with acute infarcts. Scattered punctate acute infarcts in both cerebral hemispheres suggesting emboli  CTA H&N - No large vessel occlusion or high-grade arterial stenosis. No evidence of vascular malformation on the current exam.  CT repeat 4/19 - significantly decreased hematoma post evacuation  CT repeat 4/21 - stable residual hematoma and 4 mm of midline shift  2D Echo - EF 65-70%   [employer] Virus 2 - negative - Ab positive  LDL - 74  HgbA1c - 5.6  UDS - negative  VTE prophylaxis - heparin subq  No antithrombotic prior to admission, now on No antithrombotic due to hemorrhage  Therapy recommendations:  Re-order once pt stable for  evaluation  Disposition:  Pending  Family still need time to make decision, would like to see how she does this weekend, did not against palliative care consult next week  Acute Respiratory Failure  Intubated for airway protection  On sedation   CCM on board  No extubation given worsening hypoxemia, infiltrated, increasing WBC (?PNA)  Bronch w/ ET repositioning, Cx  Family hope to avoid trach  Possible Aspiration Pneumonia Leukocytosis Fever   Nausea vomiting on presentation  Cefepime 4/16 >>4/22  CCM on board  Temp - 100.5 ->...->102.2->100.7->101.4  WBCs - 11.6->14.2->14.7->17.4->19.5->12.5->12.4->16.4  CXR 4/23 worsening R  infiltrate, repeat CXR stable dense patchy opacity R mid lung ? PNA. Slightly improved atx/PNA LLL. Mild R basilar atx. Small R pleural effusion.   UA neg  Resp Cx 4/18 normal flora  Blood Cx 4/20 NGTD    Resp Cx 4/21 no growth  Resp Cx 4/24 pending   Meropenem 4/23>>  Vancomycin 4/23>>  Cerebral edema  5 mm Rightward Midline Shift on CT and MRI  NSG on board s/p hematoma evacuation 4/17   CT 4/21 stable residual hematoma and midline shift  Off 3%   23.4% saline x1  Na - 139->...->161->160->158  Na every 6h  Add free water 100cc Q4h  Atrial Fibrillation w/ RVR  New diagnosis  Given her embolic strokes on MRI, pt likely has PAF before admission  Now back to NSR  Amiodarone discontinued  On cardizem IV - consider to transition to po  Cardiology consulted - appreciate help  May consider AC once blood absorbed in the future (likely months before being able to add)  UE shivering/shaking  Bilateral versus unilateral arm shaking/shivering  LTM EEG no seizure to correlate the shaking -> discontinued 4/19  Keppra - 500 mg Q 12 hrs after Keppra 2 g load  Seizure precautions  Hypertension  Home BP meds: Zestril 10 mg daily  off Cleviprex and normodyne (infusion)  On cardizem drip  Add lisinopril 10mg  bid  -> off   Stable on the high end . SBP goal < 160 mm Hg  . Long-term BP goal normotensive  Hyperlipidemia  Home Lipid lowering medication: Pravachol 20 mg daily  LDL 74, goal < 70  Current lipid lowering medication: None for now  May resume statin at discharge vs. SATURN trial  Dysphagia At risk malnutrition   N.p.o.  On tube feeding at 25 cc/h -> 45cc  Speech following  Dietitian on board  Anemia, acute blood loss  Hb - 10.8->...->8.1   Likely due to post op  S/p PRBC in OR  Iron 16 and ferritin 66  CBC monitoring  Iron pills added  Other Stroke Risk Factors  Advanced age  Morbid Obesity, Body mass index is 42.53 kg/m., recommend weight loss, diet and exercise as appropriate   On estrogen daily po PTA  Other Active Problems  Code status - Full code  Hypokalemia - treated - K 3.4->...->3.2   Hypophosphatemia - treated - Phos 1.7->...>2.3   Family requested COVID Ab testing after COVID vaccine - serology reactive  Right eyelid swollen - d/c lisinopril  Hospital day # 7  This patient is critically ill due to large ICH, hemorrhagic conversion, hypertensive emergency, seizure, respiratory failure and at significant risk of neurological worsening, death form cerebral edema, brain herniation, hematoma expansion, recurrent stroke, epilepsy status. This patient's care requires constant monitoring of vital signs, hemodynamics, respiratory and cardiac monitoring, review of multiple databases, neurological assessment, discussion with family, other specialists and medical decision making of high complexity. I spent 35 minutes of neurocritical care time in the care of this patient. I had long discussion with daughter at bedside, updated pt current condition, treatment plan and potential prognosis, and answered all the questions.  She expressed understanding and appreciation.   June, MD PhD Stroke Neurology 02/16/2020 11:50 AM   To contact Stroke  Continuity provider, please refer to 02/18/2020. After hours, contact General Neurology

## 2020-02-16 NOTE — Progress Notes (Signed)
Pharmacy Antibiotic Note  Meredith Cooper is a 64 y.o. female admitted on 02/02/2020 with code stroke with sudden onset R sided weakness, L gaze deviation. Pharmacy has been consulted for vancomycin and meropenem dosing.  Found thick grey-red aspirate in bronch, completed a course of cefepime. WBC 16.4. Temperature in the last 24 hour: max is 99.57F, and avg 99.24F.  Respiratory culture collected on 4/23. Blood culture reports are still pending.  Plan: Start Vancomycin 2g IV x1. Then continue with vancomycin 1500 mg IV Q24H.  Start Meropenem 1g IV Q8H.   Follow-up with renal function, Scr. Monitor for signs and symptoms of improvement. Monitor temperature and WBC level.  Follow-up with respiratory and blood cultures.   Height: 5\' 4"  (162.6 cm) Weight: 112.4 kg (247 lb 12.8 oz) IBW/kg (Calculated) : 54.7  Temp (24hrs), Avg:99.3 F (37.4 C), Min:98.4 F (36.9 C), Max:99.5 F (37.5 C)  Recent Labs  Lab 02/12/20 0551 02/12/20 0551 02/12/20 0820 02/13/20 0347 02/14/20 0409 02/14/20 1031 02/15/20 0516 02/16/20 0533  WBC 17.4*  --   --  19.5* 12.5*  --  12.4* 16.4*  CREATININE 0.72   < > 0.72 0.76 0.77  --  0.70 0.71  LATICACIDVEN  --   --   --   --   --  1.8  --   --    < > = values in this interval not displayed.    Estimated Creatinine Clearance: 85 mL/min (by C-G formula based on SCr of 0.71 mg/dL).    Allergies  Allergen Reactions  . Penicillins Itching and Rash    Antimicrobials this admission: Meropenem 4/23 >> Vancomycin 4/23 >> Cefepime 4/16 >> 4/22  Dose adjustments this admission: N/A  Microbiology results: 4/20 BCx: Pending, but no growth so far. 4/23 Sputum: Pending. Previous lab results are also pending, but no growth so far. 4/21 Gram stain: No organism seen.  4/16 MRSA PCR: Negative.   Thank you for allowing pharmacy to be a part of this patient's care.  5/16 PharmD Student 02/16/2020 8:58 AM

## 2020-02-16 NOTE — Progress Notes (Addendum)
NAME:  Meredith Cooper, MRN:  [id number], DOB:  1955/11/28, LOS: 7 ADMISSION DATE:  02/11/2020, CONSULTATION DATE:  02/12/2020 REFERRING MD:  Leonie Man - neuro, CHIEF COMPLAINT:  Code stroke - L gaze deviation, R sided weakness   Brief History   63 yowf never smoker  presented to [facility] ED 4/16 as code stroke with sudden onset R sided weakness, L gaze deviation. Rapid deterioration in ED; patient vomited multiple times in CT, then required intubation for airway protection. CTH with large acute parenchymal hemorrhage in L frontal lobe, 8.2 x 6.5 x 5.6 cm, 50m midline shift.   Past Medical History  HTN HLD Hiatal hernia GERD S/p hysterectomy   Significant Hospital Events   4/16 Code stroke, large ICH, Intubated.  4/17 overnight possible seizure activity, received ativan and started Keppra. Max on cleviprex, added PRN hydralazine for SBP goal <140 4/17  no sz 4/17 pm craniotomy for hematoma evacuation  4/18 AFRVR 4/21 Very good tidals and mechanics on SBT -  Propofol restarted overnight at low dose, Received lasix for HTN  Overnight, 1x fever 102.2, received APAP Overnight some Afib/flutter  This morning, propofol is off Did brief vent wean PSV/CPAP to assess mechanics -- adequate SBT with very good volumes   4/22 - - fever curve down. WBC plateau. PCT 0.11. Trach aspirate negative so far. Doing PSV. 3% saline stopped following severe anasarca. RN reporting sudden left orbital swelling and left lip and tongue.  Daughter concurs though thinks is dependency related. PEr RN Neuro MD felt same x 24h  . Patient noticed be on lisinoprl. Daughter  [facility]  [facility]  Stroke team   Procedures:  4/16 ETT >  4/16 CVC>> 4/16 Arterial line 4/17 Craniotomy/ no drain  Significant Diagnostic Tests:  4/16 CT H> Large acute intraparenchymal hemorrhage centered in L frontal lobe 8.2 x 6.5 x 5.6 cm, 549mmidline shift.  4/16 CTA H> no aneurysm  4/17 CT H> unchanged size of massive IPH centered in L frontal  lobe. Interval increase in amount of blood in R lateral ventricle occipital horn. 36m60midline shift  4/17 MRI Brain >>  1. Unchanged large left frontoparietal parenchymal hematoma with unchanged rightward midline shift. Mild surrounding edema and restricted diffusion consistent with associated acute infarct. 2. Small volume intraventricular and subarachnoid hemorrhage. 3. Scattered punctate acute infarcts in both cerebral hemispheres suggesting emboli. - echo 4/17 with mod PAS elevation/ mild LVH 4/19 CT Head - s/p left frontal parietal craniotomy. No new hemorrhage. Improved midline shift, current 4mm40mtable IVH without hydrocephalus 4/19 EEG -cortical dysfunction in left hemisphere likely secondary to underlying hemorrhage. Additionally, there is evidence of severe diffuse encephalopathy, non specific to etiology.No seizures ordefiniteepileptiform discharges were seen throughout the recording.  Micro Data:  4/16 SARS Cov2   neg  4/16  MRSA PCR  neg  4/18 Respiratory culture-rare GPC's, normal flora   Antimicrobials:  4/16 cefepime>> added for possible aspiration event , end 4/22 4/17 Vanc x1 for OR xxx 4/23 - vanc  4/23 - zosyn  Interim history/subjective:    02/16/2020  -> ET tube 9cm above carina. Increased R sided infiltrates. C/o mucus plugging. Afebrile but wbc up. Trach secretions +.  no growth so far. Fio2 up at 60%. Angioedema improved  Objective   Blood pressure (!) 152/60, pulse 81, temperature 99.4 F (37.4 C), temperature source Axillary, resp. rate (!) 27, height _0  (1.626 m), weight 112.4 kg, SpO2 94 %.    Vent Mode: PRVC FiO2 (%):  [  40 %-60 %] 60 % Set Rate:  [20 bmp] 20 bmp Vt Set:  [440 mL] 440 mL PEEP:  [5 cmH20] 5 cmH20 Pressure Support:  [12 cmH20] 12 cmH20 Plateau Pressure:  [22 cmH20] 22 cmH20   Intake/Output Summary (Last 24 hours) at 02/16/2020 0759 Last data filed at 02/16/2020 0600 Gross per 24 hour  Intake 3366.49 ml  Output 2350 ml  Net  1016.49 ml   Filed Weights   02/19/2020 1559 02/15/20 0500 02/16/20 0500  Weight: 109.3 kg 112.6 kg 112.4 kg     General Appearance:  Looks criticall ill OBESE - + Head:  Normocephalic, without obvious abnormality, atraumatic Eyes:  PERRL - yes, conjunctiva/corneas - muddy.  PEriorbiatl edema - better Ears:  Normal external ear canals, both ears Nose:  G tube - no Throat:  ETT TUBE - yes - seems retracted , OG tube - yes Neck:  Supple,  No enlargement/tenderness/nodules Lungs: Clear to auscultation bilaterally, Ventilator   Synchrony - yes but 60% fi02 and wose Heart:  S1 and S2 normal, no murmur, CVP - no.  Pressors - no Abdomen:  Soft, no masses, no organomegaly Genitalia / Rectal:  Not done Extremities:  Extremities- intact with edema Skin:  ntact in exposed areas . Sacral area - not examined Neurologic:  Sedation - diprivan gtt, fent gtt -> RASS - -4     Resolved Hospital Problem list     Assessment & Plan:   Acute hypoxic respiratory failure with failure to protect airway, likely aspiration event, in setting of intraparenchymal hemorrhage  -intubated in ED with failed airway protection after vomiting   - 02/16/2020 - > does not meet criteria for SBT/Extubation in setting of Acute Respiratory Failure due to worsening hypoxemia, infiltrates, rising WBC (? VAP) and  hihg prob dislodged ET tube though currnently volumes ok and BS ok    P - aim to reinsert et tube with bronch (daughter consented at bedside orally) - Continue vent support - mental status will preclude extubation efforts - Depending on family wishes/neuro prognostication, this may be a candidate for early tracheostomy.  - VAP bundle, pulm hygiene  - PRN CXR  - PAD fentnayl versed   Possible VAP 02/16/20  Plan   Start vanc and zosyn empric  await culture Might need repeat culture 4/23 (will consider Bronch BAL)   Sedation needs on ventilator   - RASS -3  Plan  - cotinue fentanyl gtt  - continue  diprivan gtt  - RASS goal 0 to -2  Possible angioedema 02/15/20  - hard to differentiate between anasarca . Sudden per RN  Plan  - dc home lisinopril being given and stay off  Large ICH with cytotoxic cerebral edema  Possible seizure activity  -s/p craniotomy pm 4/17 s drain -S/p 3% saline ended 02/15/20  - Na 150  P -CVA per neuro, s/p crani per NSGY -SBP goal < 160 -BID keppra   Volume overload 02/14/20 aonwards with 3% saline ending 4/22  Plan  - lasix x 1 again 4/22  - monitor Na now off 3% saline  Atrial Fibrillation with RVR, rate controlled on dilt gtt  -possibly underlying PAF vs new Afib in setting of acute critical illness  HTN - echo 4/17 with mod PAS elevation/ mild LVH  - A Fib continues 02/16/20  P -Cards consulted  -rec to continue amio gtt and dilt gtt -will appreciate neurology RE timing of anticoagulation initiation   Hypokalemia  P -repleted by elink 4/23  Hyperglycemia P -SSI  Acute blood loss anemia  -in setting of craniotomy 4/17   P -trend CBC -Goal hgb  >7; no indication for transfusion 4/22  At risk malnutrition P -TF   Best practice:  Diet: EN per RDN  Pain/Anxiety/Delirium protocol (if indicated): RASS goal 0, PRN fentanyl PRN versed VAP protocol (if indicated): yes DVT prophylaxis: SQ heparin  GI prophylaxis: protonix  Glucose control: SSI Mobility: BR Code Status: Full  Family Communication: daughter 02/16/20 (consented for reinsertion/ ET tube and bronch BAL/trach aspirate) -> post bronch had discussion again with daughter Alitzel Cookson - she said mom values quality of life but daughter feels patient will regain this with current willness. Discussed trach and chronic crittical illness and she is willing to go through t his for mom Disposition: ICU     ATTESTATION & SIGNATURE   The patient Shane Melby is critically ill with multiple organ systems failure and requires high complexity decision making for assessment and  support, frequent evaluation and titration of therapies, application of advanced monitoring technologies and extensive interpretation of multiple databases.   Critical Care Time devoted to patient care services described in this note is  35  Minutes. This time reflects time of care of this signee Dr Brand Males. This critical care time does not reflect procedure time, or teaching time or supervisory time of PA/NP/Med student/Med Resident etc but could involve care discussion time     Dr. Brand Males, M.D., [facility].C.P Pulmonary and Critical Care Medicine Staff Physician [facility] Pager: [phone], If no answer or between  15:00h - 7:00h: call [phone]  02/16/2020 8:11 AM    LABS    PULMONARY Recent Labs  Lab 02/12/2020 1522 02/11/2020 1621  PHART  --  7.394  PCO2ART  --  43.5  PO2ART  --  423.0*  HCO3  --  26.6  TCO2 23 28  O2SAT  --  100.0    CBC Recent Labs  Lab 02/14/20 0409 02/15/20 0516 02/16/20 0533  HGB 7.9* 8.1* 8.1*  HCT 27.2* 28.5* 28.7*  WBC 12.5* 12.4* 16.4*  PLT 236 276 304    COAGULATION Recent Labs  Lab 02/03/2020 1523  INR 0.9    CARDIAC  No results for input(s): TROPONINI in the last 168 hours. No results for input(s): PROBNP in the last 168 hours.   CHEMISTRY Recent Labs  Lab 02/13/2020 0347 02/07/2020 0924 02/11/20 0202 02/11/20 0741 02/12/20 0551 02/12/20 0551 02/12/20 0820 02/12/20 1425 02/13/20 0347 02/13/20 0815 02/14/20 0409 02/14/20 0409 02/14/20 0857 02/14/20 2232 02/15/20 0516 02/15/20 1254 02/15/20 1730 02/16/20 0533  NA 146*   < > 157*   < > 160*   < > 159*   < > 158*   < > 154*  --    < > 158* 158* 161* 160* 158*  K 4.1  --  3.5  --  3.0*   < > 3.1*  --  3.6  --  3.3*   < >  --   --  3.7  --   --  3.2*  CL 116*  --  >130*  --  127*   < > 126*  --  127*  --  121*  --   --   --  127*  --   --  123*  CO2 22  --  21*  --  24   < > 23  --  24  --  25  --   --    --  25  --   --  27  GLUCOSE 146*  --  118*  --  153*   < > 130*  --  120*  --  147*  --   --   --  152*  --   --  150*  BUN 13  --  20  --  29*   < > 29*  --  35*  --  40*  --   --   --  43*  --   --  41*  CREATININE 0.82  --  0.81  --  0.72   < > 0.72  --  0.76  --  0.77  --   --   --  0.70  --   --  0.71  CALCIUM 8.2*  --  7.8*  --  8.5*   < > 8.6*  --  8.5*  --  8.3*  --   --   --  8.5*  --   --  8.4*  MG 2.0  --  2.1  --  2.2  --   --   --  2.1  --  2.1  --   --   --   --   --   --   --   PHOS 1.7*  --  2.2*  --  1.8*  --   --   --  2.3*  --  2.3*  --   --   --  2.3*  --   --  3.7   < > = values in this interval not displayed.   Estimated Creatinine Clearance: 85 mL/min (by C-G formula based on SCr of 0.71 mg/dL).   LIVER Recent Labs  Lab 02/15/2020 1523  AST 25  ALT 19  ALKPHOS 69  BILITOT 0.7  PROT 6.7  ALBUMIN 3.4*  INR 0.9     INFECTIOUS Recent Labs  Lab 02/14/20 0900 02/14/20 1031 02/15/20 0516 02/16/20 0533  LATICACIDVEN  --  1.8  --   --   PROCALCITON 0.12  --  0.11 <0.10     ENDOCRINE CBG (last 3)  Recent Labs    02/15/20 1934 02/15/20 2326 02/16/20 0335  GLUCAP 128* 132* 133*         IMAGING x48h  - image(s) personally visualized  -   highlighted in bold CT HEAD WO CONTRAST  Result Date: 02/14/2020 CLINICAL DATA:  Follow-up intracranial hemorrhage. EXAM: CT HEAD WITHOUT CONTRAST TECHNIQUE: Contiguous axial images were obtained from the base of the skull through the vertex without intravenous contrast. COMPARISON:  02/12/2020 FINDINGS: Brain: Postsurgical changes are again seen related to evacuation of a large left frontoparietal parenchymal hematoma. Residual left frontoparietal blood products and gas are similar to the prior motion degraded CT. Intraventricular extension is again noted with unchanged small volume blood products in the lateral ventricles, and there is no ventriculomegaly. Left frontoparietal edema is unchanged as is 4 mm of  rightward midline shift. No new intracranial hemorrhage, new infarct, or extra-axial fluid collection is identified. Vascular: No hyperdense vessel. Skull: Left frontoparietal craniotomy with overlying soft tissue swelling and skin staples. Sinuses/Orbits: Mild mucosal thickening in the paranasal sinuses. Small left mastoid effusion. Unremarkable orbits. Other: None. IMPRESSION: 1. Postoperative changes from left frontoparietal hematoma evacuation with unchanged residual blood products, edema, and 4 mm of rightward midline shift. 2. Unchanged small volume intraventricular blood products without ventriculomegaly. 3. No new intracranial abnormality. Electronically Signed  By: Logan Bores M.D.   On: 02/14/2020 16:49   DG CHEST PORT 1 VIEW  Result Date: 02/16/2020 CLINICAL DATA:  Respiratory failure. EXAM: PORTABLE CHEST 1 VIEW COMPARISON:  02/13/2020 FINDINGS: The endotracheal tube is now 9 cm above the carina and just at the thoracic inlet. The feeding tube is stable. The right IJ catheter is stable. Worsening/progressive right lung infiltrate. Persistent left basilar infiltrate. No large pleural effusions or pneumothorax. IMPRESSION: 1. Endotracheal tube is now 9 cm above the carina and just at the thoracic inlet. 2. Worsening right lung infiltrate. Electronically Signed   By: Marijo Sanes M.D.   On: 02/16/2020 07:50

## 2020-02-16 NOTE — Progress Notes (Signed)
eLink Physician-Brief Progress Note Patient Name: Meredith Cooper DOB: 1956/01/04 MRN: [id number]   Date of Service  02/16/2020  HPI/Events of Note  K+ 3.2  eICU Interventions  KCL 10 meq iv Q hourly x 4 doses        Migdalia Dk 02/16/2020, 6:45 AM

## 2020-02-16 NOTE — Procedures (Signed)
Intubation Procedure Note Mily Malecki [id number] [birth year]  Procedure: Intubation - reintubation with same ET TUbe Indications: ETT dislodged  Procedure Details Consent: informed verbal consent with daughter at bedside . Emergent need Time Out: Verified patient identification, verified procedure, site/side was marked, verified correct patient position, special equipment/implants available, medications/allergies/relevent history reviewed, required imaging and test results available.  Performed  Maximum sterile technique was used including antiseptics, gloves, gown, hand hygiene and mask.  MAC   ET tube was 9cm above carina on cxr. Patient maintating volumes. Plan was to use bronch and reinsert tube. This was attempted but there was lot of thick red mucus clogging et tube  And visual was not possible despite cleaning et tube. Therefore, changed to glidescope method . With t his airway oral cavity was found to be narrow. ET tube was found to be in the airway but balloon inflated was at the cords. Lot of mucus pooled in airway. Suctioned out. Balloon deflated  And then tube reinserted. Bronch then passed and ET tube did not have any biofilm in it. So same Et tube maintained   Evaluation Hemodynamic Status: BP stable throughout; O2 sats: transiently fell during during procedure Patient's Current Condition: stable Complications: No apparent complications Patient did tolerate procedure well. Chest X-ray ordered to verify placement.  CXR: pending.    SIGNATURE    Dr. Brand Males, M.D., F.C.C.P,  Pulmonary and Critical Care Medicine Staff Physician, [place] Director - Interstitial Lung Disease  Program  Pulmonary [place] [facility] [place], [place], [address]  Pager: [phone], If no answer or between  15:00h - 7:00h: call [phone] Telephone: [phone]  8:48 AM 02/16/2020

## 2020-02-17 ENCOUNTER — Inpatient Hospital Stay (HOSPITAL_COMMUNITY): Payer: Medicare Other

## 2020-02-17 DIAGNOSIS — J9601 Acute respiratory failure with hypoxia: Secondary | ICD-10-CM | POA: Diagnosis not present

## 2020-02-17 DIAGNOSIS — I6389 Other cerebral infarction: Secondary | ICD-10-CM

## 2020-02-17 DIAGNOSIS — I480 Paroxysmal atrial fibrillation: Secondary | ICD-10-CM | POA: Diagnosis not present

## 2020-02-17 LAB — COMPREHENSIVE METABOLIC PANEL
ALT: 32 U/L (ref 0–44)
AST: 28 U/L (ref 15–41)
Albumin: 1.5 g/dL — ABNORMAL LOW (ref 3.5–5.0)
Alkaline Phosphatase: 72 U/L (ref 38–126)
Anion gap: 7 (ref 5–15)
BUN: 35 mg/dL — ABNORMAL HIGH (ref 8–23)
CO2: 27 mmol/L (ref 22–32)
Calcium: 8.3 mg/dL — ABNORMAL LOW (ref 8.9–10.3)
Chloride: 124 mmol/L — ABNORMAL HIGH (ref 98–111)
Creatinine, Ser: 0.77 mg/dL (ref 0.44–1.00)
GFR calc Af Amer: >60 mL/min (ref >60)
GFR calc non Af Amer: >60 mL/min (ref >60)
Glucose, Bld: 133 mg/dL — ABNORMAL HIGH (ref 70–99)
Potassium: 3.4 mmol/L — ABNORMAL LOW (ref 3.5–5.1)
Sodium: 158 mmol/L — ABNORMAL HIGH (ref 135–145)
Total Bilirubin: 0.6 mg/dL (ref 0.3–1.2)
Total Protein: 5.1 g/dL — ABNORMAL LOW (ref 6.5–8.1)

## 2020-02-17 LAB — CBC WITH DIFFERENTIAL/PLATELET
Abs Immature Granulocytes: 0.34 10*3/uL — ABNORMAL HIGH (ref 0.00–0.07)
Basophils Absolute: 0.1 10*3/uL (ref 0.0–0.1)
Basophils Relative: 0 %
Eosinophils Absolute: 0.4 10*3/uL (ref 0.0–0.5)
Eosinophils Relative: 2 %
HCT: 27 % — ABNORMAL LOW (ref 36.0–46.0)
Hemoglobin: 7.7 g/dL — ABNORMAL LOW (ref 12.0–15.0)
Immature Granulocytes: 2 %
Lymphocytes Relative: 15 %
Lymphs Abs: 2.4 10*3/uL (ref 0.7–4.0)
MCH: 25.2 pg — ABNORMAL LOW (ref 26.0–34.0)
MCHC: 28.5 g/dL — ABNORMAL LOW (ref 30.0–36.0)
MCV: 88.5 fL (ref 80.0–100.0)
Monocytes Absolute: 1.8 10*3/uL — ABNORMAL HIGH (ref 0.1–1.0)
Monocytes Relative: 11 %
Neutro Abs: 11.1 10*3/uL — ABNORMAL HIGH (ref 1.7–7.7)
Neutrophils Relative %: 70 %
Platelets: 310 10*3/uL (ref 150–400)
RBC: 3.05 MIL/uL — ABNORMAL LOW (ref 3.87–5.11)
RDW: 16.5 % — ABNORMAL HIGH (ref 11.5–15.5)
WBC: 16.1 10*3/uL — ABNORMAL HIGH (ref 4.0–10.5)
nRBC: 0.3 % — ABNORMAL HIGH (ref 0.0–0.2)

## 2020-02-17 LAB — GLUCOSE, CAPILLARY
Glucose-Capillary: 104 mg/dL — ABNORMAL HIGH (ref 70–99)
Glucose-Capillary: 119 mg/dL — ABNORMAL HIGH (ref 70–99)
Glucose-Capillary: 120 mg/dL — ABNORMAL HIGH (ref 70–99)
Glucose-Capillary: 123 mg/dL — ABNORMAL HIGH (ref 70–99)
Glucose-Capillary: 126 mg/dL — ABNORMAL HIGH (ref 70–99)
Glucose-Capillary: 129 mg/dL — ABNORMAL HIGH (ref 70–99)

## 2020-02-17 LAB — SODIUM
Sodium: 156 mmol/L — ABNORMAL HIGH (ref 135–145)
Sodium: 157 mmol/L — ABNORMAL HIGH (ref 135–145)
Sodium: 157 mmol/L — ABNORMAL HIGH (ref 135–145)
Sodium: 159 mmol/L — ABNORMAL HIGH (ref 135–145)

## 2020-02-17 LAB — CULTURE, RESPIRATORY W GRAM STAIN

## 2020-02-17 LAB — PHOSPHORUS: Phosphorus: 2.5 mg/dL (ref 2.5–4.6)

## 2020-02-17 LAB — LACTIC ACID, PLASMA: Lactic Acid, Venous: 1.1 mmol/L (ref 0.5–1.9)

## 2020-02-17 LAB — MAGNESIUM: Magnesium: 2.3 mg/dL (ref 1.7–2.4)

## 2020-02-17 MED ORDER — ALBUTEROL SULFATE (2.5 MG/3ML) 0.083% IN NEBU: 2.5000 mg | INHALATION_SOLUTION | RESPIRATORY_TRACT | Status: DC | PRN

## 2020-02-17 MED ORDER — DILTIAZEM 12 MG/ML ORAL SUSPENSION
30.0000 mg | Freq: Four times a day (QID) | ORAL | Status: DC
  Administered 2020-02-17 – 2020-02-22 (×20): 30 mg
  Filled 2020-02-17 (×21): qty 3

## 2020-02-17 MED ORDER — LABETALOL HCL 5 MG/ML IV SOLN
10.0000 mg | INTRAVENOUS | Status: DC | PRN
  Filled 2020-02-17: qty 4

## 2020-02-17 MED ORDER — DILTIAZEM HCL 30 MG PO TABS: 30.0000 mg | ORAL_TABLET | Freq: Four times a day (QID) | ORAL | Status: DC

## 2020-02-17 MED ORDER — POTASSIUM CHLORIDE 10 MEQ/50ML IV SOLN
10.0000 meq | INTRAVENOUS | Status: AC
  Administered 2020-02-17 (×4): 10 meq via INTRAVENOUS
  Filled 2020-02-17 (×4): qty 50

## 2020-02-17 MED ORDER — HYDRALAZINE HCL 20 MG/ML IJ SOLN
10.0000 mg | INTRAMUSCULAR | Status: DC | PRN
  Administered 2020-02-17 – 2020-02-19 (×2): 10 mg via INTRAVENOUS
  Filled 2020-02-17 (×2): qty 1

## 2020-02-17 MED ORDER — FENTANYL CITRATE (PF) 100 MCG/2ML IJ SOLN
50.0000 ug | INTRAMUSCULAR | Status: DC | PRN
  Administered 2020-02-17: 100 ug via INTRAVENOUS
  Administered 2020-02-17 – 2020-02-18 (×2): 200 ug via INTRAVENOUS
  Administered 2020-02-18 (×2): 100 ug via INTRAVENOUS
  Administered 2020-02-18: 200 ug via INTRAVENOUS
  Administered 2020-02-19: 100 ug via INTRAVENOUS
  Administered 2020-02-19: 200 ug via INTRAVENOUS
  Administered 2020-02-19: 100 ug via INTRAVENOUS
  Filled 2020-02-17 (×3): qty 2
  Filled 2020-02-17 (×3): qty 4
  Filled 2020-02-17 (×3): qty 2

## 2020-02-17 MED ORDER — MIDAZOLAM HCL 2 MG/2ML IJ SOLN
1.0000 mg | INTRAMUSCULAR | Status: DC | PRN
  Administered 2020-02-18 (×2): 2 mg via INTRAVENOUS
  Administered 2020-02-18: 4 mg via INTRAVENOUS
  Filled 2020-02-17 (×2): qty 4

## 2020-02-17 MED ORDER — POTASSIUM CHLORIDE 20 MEQ/15ML (10%) PO SOLN
40.0000 meq | Freq: Once | ORAL | Status: AC
  Administered 2020-02-17: 40 meq
  Filled 2020-02-17: qty 30

## 2020-02-17 NOTE — Progress Notes (Signed)
RT NOTES: Tried patient on pressure support. Respiratory rate increased to 48. Placed back on full support at this time.

## 2020-02-17 NOTE — Progress Notes (Signed)
ETT changed by ICU MD due to extreme cuff leak.

## 2020-02-17 NOTE — Progress Notes (Signed)
RT NOTES: Transported to CT and back to room 4N31 on vent without complications.

## 2020-02-17 NOTE — Progress Notes (Addendum)
Progress Note  Patient Name: Meredith Cooper Date of Encounter: 02/17/2020  Primary Cardiologist: No primary care provider on file.  Dr. Meda Coffee  Subjective   Intubated, obtunded  Inpatient Medications    Scheduled Meds: . acetylcysteine  2 mL Nebulization Q4H  . albuterol  2.5 mg Nebulization Q4H  . chlorhexidine gluconate (MEDLINE KIT)  15 mL Mouth Rinse BID  . Chlorhexidine Gluconate Cloth  6 each Topical Daily  . docusate  100 mg Per Tube BID  . feeding supplement (PRO-STAT SUGAR FREE 64)  30 mL Per Tube TID  . ferrous sulfate  300 mg Per Tube BID WC  . free water  100 mL Per Tube Q4H  . heparin injection (subcutaneous)  5,000 Units Subcutaneous Q8H  . insulin aspart  0-9 Units Subcutaneous Q4H  . levETIRAcetam  500 mg Per Tube BID  . levothyroxine  112 mcg Per Tube Q0600  . mouth rinse  15 mL Mouth Rinse 10 times per day  . pantoprazole sodium  40 mg Per Tube Daily  . senna-docusate  1 tablet Per Tube BID  . sodium chloride flush  10-40 mL Intracatheter Q12H   Continuous Infusions: . sodium chloride Stopped (02/16/20 0844)  . diltiazem (CARDIZEM) infusion 5 mg/hr (02/17/20 1000)  . feeding supplement (VITAL HIGH PROTEIN) 1,000 mL (02/15/20 0953)  . fentaNYL infusion INTRAVENOUS 25 mcg/hr (02/17/20 1000)  . meropenem (MERREM) IV 1 g (02/17/20 1030)  . propofol (DIPRIVAN) infusion 10 mcg/kg/min (02/17/20 1000)  . vancomycin     PRN Meds: acetaminophen **OR** acetaminophen (TYLENOL) oral liquid 160 mg/5 mL **OR** acetaminophen, fentaNYL (SUBLIMAZE) injection, hydrALAZINE, midazolam, sodium chloride flush   Vital Signs    Vitals:   02/17/20 0700 02/17/20 0800 02/17/20 0828 02/17/20 0900  BP: (!) 173/61 (!) 129/54 (!) 129/54 (!) 160/65  Pulse: 84 78 84 78  Resp: (!) 33 (!) 25 (!) 38 (!) 30  Temp:  (!) 101.2 F (38.4 C)    TempSrc:  Axillary    SpO2: 97% 99% 98% 97%  Weight:      Height:        Intake/Output Summary (Last 24 hours) at 02/17/2020 1035 Last data  filed at 02/17/2020 1023 Gross per 24 hour  Intake 2940.04 ml  Output 1825 ml  Net 1115.04 ml   Last 3 Weights 02/16/2020 02/15/2020 02/15/2020  Weight (lbs) 247 lb 12.8 oz 248 lb 3.8 oz 240 lb 15.4 oz  Weight (kg) 112.4 kg 112.6 kg 109.3 kg      Telemetry    Sinus rhythm, brief SVT noted- Personally Reviewed  ECG    No new- Personally Reviewed  Physical Exam   Obtunded, on vent, daughter in room, heart regular rate and rhythm.  Abdomen soft nontender, mild edema   Labs    High Sensitivity Troponin:  No results for input(s): TROPONINIHS in the last 720 hours.    Chemistry Recent Labs  Lab 02/15/20 0516 02/15/20 1254 02/16/20 0533 02/16/20 1200 02/16/20 1730 02/16/20 2330 02/17/20 0623  NA 158*   < > 158*   < > 161* 159* 158*  K 3.7  --  3.2*  --   --   --  3.4*  CL 127*  --  123*  --   --   --  124*  CO2 25  --  27  --   --   --  27  GLUCOSE 152*  --  150*  --   --   --  133*  BUN 43*  --  41*  --   --   --  35*  CREATININE 0.70  --  0.71  --   --   --  0.77  CALCIUM 8.5*  --  8.4*  --   --   --  8.3*  PROT  --   --   --   --   --   --  5.1*  ALBUMIN  --   --   --   --   --   --  1.5*  AST  --   --   --   --   --   --  28  ALT  --   --   --   --   --   --  32  ALKPHOS  --   --   --   --   --   --  72  BILITOT  --   --   --   --   --   --  0.6  GFRNONAA >60  --  >60  --   --   --  >60  GFRAA >60  --  >60  --   --   --  >60  ANIONGAP 6  --  8  --   --   --  7   < > = values in this interval not displayed.     Hematology Recent Labs  Lab 02/15/20 0516 02/16/20 0533 02/17/20 0623  WBC 12.4* 16.4* 16.1*  RBC 3.21* 3.25* 3.05*  HGB 8.1* 8.1* 7.7*  HCT 28.5* 28.7* 27.0*  MCV 88.8 88.3 88.5  MCH 25.2* 24.9* 25.2*  MCHC 28.4* 28.2* 28.5*  RDW 16.3* 16.6* 16.5*  PLT 276 304 310    BNPNo results for input(s): BNP, PROBNP in the last 168 hours.   DDimer No results for input(s): DDIMER in the last 168 hours.   Radiology    DG CHEST PORT 1 VIEW  Result  Date: 02/16/2020 CLINICAL DATA:  Intubated. EXAM: PORTABLE CHEST 1 VIEW COMPARISON:  Earlier today. FINDINGS: Endotracheal tube in satisfactory position. Right jugular catheter tip in the superior vena cava. Feeding tube extending into the stomach. Stable enlarged cardiac silhouette. No significant change in dense patchy opacity in the right mid lung zone. Slightly improved patchy opacity in the left lower lobe with some linear density currently demonstrated. Small right pleural effusion. Mild interval linear density at the right lung base. Thoracic spine degenerative changes. IMPRESSION: 1. Stable dense patchy opacity in the right mid lung zone, suspicious for pneumonia. 2. Slightly improved patchy atelectasis or pneumonia in the left lower lobe. 3. Mild interval right basilar atelectasis. 4. Small right pleural effusion. 5. Stable cardiomegaly. Electronically Signed   By: Claudie Revering M.D.   On: 02/16/2020 09:26   DG CHEST PORT 1 VIEW  Result Date: 02/16/2020 CLINICAL DATA:  Respiratory failure. EXAM: PORTABLE CHEST 1 VIEW COMPARISON:  02/13/2020 FINDINGS: The endotracheal tube is now 9 cm above the carina and just at the thoracic inlet. The feeding tube is stable. The right IJ catheter is stable. Worsening/progressive right lung infiltrate. Persistent left basilar infiltrate. No large pleural effusions or pneumothorax. IMPRESSION: 1. Endotracheal tube is now 9 cm above the carina and just at the thoracic inlet. 2. Worsening right lung infiltrate. Electronically Signed   By: Marijo Sanes M.D.   On: 02/16/2020 07:50    Cardiac Studies   ECHO EF 65%  Patient Profile     64 y.o.  female with acute stroke and associated encephalopathy with brief paroxysmal atrial fibrillation  Assessment & Plan    Brief paroxysmal atrial fibrillation -Discontinued amiodarone yesterday -Would restart only if she goes back in atrial fibrillation. -IV diltiazem currently being utilized, low-dose.  We will go ahead and  transition to p.o. Cardizem 30 mg 4 times a day via NG.  When she is taking p.o., go ahead and consider consolidating further with long-acting. -CHA2DS2-VASc 5-at some point would benefit from chronic anticoagulation, obviously now given her intraparenchymal hemorrhage will defer to neurology on timing. -Normal EF.  Acute stroke -Neurology.  Rightward midline shift.  Craniotomy.  Evacuation 4/17.  Please let us know if we can be of further assistance.  We will go ahead and sign off.  I spoke with daughter.  She is her only child.`      For questions or updates, please contact [place] Please consult [email or web address] for contact info under        Signed, Candee Furbish, MD  02/17/2020, 10:35 AM

## 2020-02-17 NOTE — Progress Notes (Signed)
NAME:  Jaiya Mooradian, MRN:  [id number], DOB:  17-Jan-1956, LOS: 8 ADMISSION DATE:  03/09/2020, CONSULTATION DATE:  03-09-2020 REFERRING MD:  Pearlean Brownie - neuro, CHIEF COMPLAINT:  Code stroke - L gaze deviation, R sided weakness   Brief History   64 yowf never smoker  presented to [facility] ED 4/16 as code stroke with sudden onset R sided weakness, L gaze deviation. Rapid deterioration in ED; patient vomited multiple times in CT, then required intubation for airway protection. CTH with large acute parenchymal hemorrhage in L frontal lobe, 8.2 x 6.5 x 5.6 cm, 40mm midline shift.   Past Medical History  HTN HLD Hiatal hernia GERD S/p hysterectomy   Significant Hospital Events   4/16 Code stroke, large ICH, Intubated.  4/17 overnight possible seizure activity, received ativan and started Keppra. Max on cleviprex, added PRN hydralazine for SBP goal <140 4/17  no sz 4/17 pm craniotomy for hematoma evacuation  4/18 AFRVR 4/21 Very good tidals and mechanics on SBT -  Propofol restarted overnight at low dose, Received lasix for HTN  Overnight, 1x fever 102.2, received APAP Overnight some Afib/flutter  This morning, propofol is off Did brief vent wean PSV/CPAP to assess mechanics -- adequate SBT with very good volumes   4/22 - - fever curve down. WBC plateau. PCT 0.11. Trach aspirate negative so far. Doing PSV. 3% saline stopped following severe anasarca. RN reporting sudden left orbital swelling and left lip and tongue.  Daughter concurs though thinks is dependency related. PEr RN Neuro MD felt same x 24h  . Patient noticed be on lisinoprl. Daughter  4/23 -  ET tube 9cm above carina. Increased R sided infiltrates. C/o mucus plugging. Afebrile but wbc up. Trach secretions +.  no growth so far. Fio2 up at 60%. Angioedema improved -> reintubted and s.p bronch  Consults:  NSGY PCCM  Stroke team   Procedures:  4/16 ETT >  4/16 CVC>> 4/16 Arterial line 4/17 Craniotomy/ no drain  Significant Diagnostic  Tests:  4/16 CT H> Large acute intraparenchymal hemorrhage centered in L frontal lobe 8.2 x 6.5 x 5.6 cm, 53mm midline shift.  4/16 CTA H> no aneurysm  4/17 CT H> unchanged size of massive IPH centered in L frontal lobe. Interval increase in amount of blood in R lateral ventricle occipital horn. 41mm midline shift  4/17 MRI Brain >>  1. Unchanged large left frontoparietal parenchymal hematoma with unchanged rightward midline shift. Mild surrounding edema and restricted diffusion consistent with associated acute infarct. 2. Small volume intraventricular and subarachnoid hemorrhage. 3. Scattered punctate acute infarcts in both cerebral hemispheres suggesting emboli. - echo 4/17 with mod PAS elevation/ mild LVH 4/19 CT Head - s/p left frontal parietal craniotomy. No new hemorrhage. Improved midline shift, current 36mm. Stable IVH without hydrocephalus 4/19 EEG -cortical dysfunction in left hemisphere likely secondary to underlying hemorrhage. Additionally, there is evidence of severe diffuse encephalopathy, non specific to etiology.No seizures ordefiniteepileptiform discharges were seen throughout the recording.    Micro Data:  4/16 SARS Cov2   neg  4/16  MRSA PCR  neg  4/18 Respiratory culture-rare GPC's, normal flora  xxxx 4/.23  - bronch aspiration  Antimicrobials:  4/16 cefepime>> added for possible aspiration event , end 4/22 4/17 Vanc x1 for OR xxx 4/23 - vanc  4/23 - merrem  Interim history/subjective:    02/17/2020  -cards changed to poi cardizem. Gets vent dysnchrony off fentanyl. STill febrile. Neuro feels prognosis bad. Family conference held with patient. Joined by her friend  John on speaker phone. On mucomyst  Objective   Blood pressure (S) (!) 175/68, pulse 79, temperature (!) 101.2 F (38.4 C), temperature source Axillary, resp. rate (!) 32, height 5\' 4"  (1.626 m), weight 112.4 kg, SpO2 97 %.    Vent Mode: PRVC FiO2 (%):  [40 %-80 %] 40 % Set Rate:  [20 bmp] 20  bmp Vt Set:  [440 mL] 440 mL PEEP:  [5 cmH20] 5 cmH20 Plateau Pressure:  [16 cmH20-26 cmH20] 16 cmH20   Intake/Output Summary (Last 24 hours) at 02/17/2020 1107 Last data filed at 02/17/2020 1023 Gross per 24 hour  Intake 2902.45 ml  Output 1825 ml  Net 1077.45 ml   Filed Weights   02/06/2020 1559 02/15/20 0500 02/16/20 0500  Weight: 109.3 kg 112.6 kg 112.4 kg    General Appearance:  Looks criticall ill OBESE - + Head:  Bandage on left head Eyes:  PERRL - yes, conjunctiva/corneas - muddy     Ears:  Normal external ear canals, both ears Nose:  G tube - n Throat:  ETT TUBE - yes , OG tube - yes Neck:  Supple,  No enlargement/tenderness/nodules Lungs: Clear to auscultation bilaterally, Ventilator   Synchrony - yes but only when sedated. With just diprivan gets dysnch Heart:  S1 and S2 normal, no murmur, CVP - x.  Pressors - no Abdomen:  Soft, no masses, no organomegaly Genitalia / Rectal:  Not done Extremities:  Extremities- intact Skin:  ntact in exposed areas . Sacral area - not examined Neurologic:  Sedation - diprivan gtt -> RASS - -4     Resolved Hospital Problem list    Possible angioedema 02/15/20  - hard to differentiate between anasarca . Sudden per RN  - resolved 4/24  Plan  - dc home lisinopril being given and stay off  Assessment & Plan:   Acute hypoxic respiratory failure with failure to protect airway, likely aspiration event, in setting of intraparenchymal hemorrhage  -intubated in ED with failed airway protection after vomiting - s/p reintubation 02/16/20   02/17/2020 - > does not meet criteria for SBT/Extubation in setting of Acute Respiratory Failure due to coma and VAP  -  P - Continue vent support - mental status will preclude extubation efforts - Depending on family wishes/neuro prognostication, this may be a candidate for early tracheostomy.  - VAP bundle, pulm hygiene  - PRN CXR  - PAD fentnayl versed   Possible/ High prob VAP 02/16/20  -  02/17/2020 - still febrile. Improved mucus after mucomyst and abx  Plan vanc and meerrem  await culture  Sedation needs on ventilator   - RASS -3  Plan  - change fentanyl gtt to prn  - versed prn  - continue diprivan gtt  - RASS goal 0 to -2    Large ICH with cytotoxic cerebral edema  Possible seizure activity  -s/p craniotomy pm 4/17 s drain -S/p 3% saline ended 02/15/20  - Na 158  P -CVA per neuro, s/p crani per NSGY -SBP goal < 160 -BID keppra  - CT head  Volume overload 02/14/20 aonwards with 3% saline ending 4/22  - last lasix 4/22 or 4/23.   Plan  - monitor Na now off 3% saline  Atrial Fibrillation with RVR, rate controlled on dilt gtt  -possibly underlying PAF vs new Afib in setting of acute critical illness    HTN - echo 4/17 with mod PAS elevation/ mild LVH  - A Fib continues 02/17/20 and cards changed to  po cardizem  P -po cardizem -will appreciate neurology RE timing of anticoagulation initiation   Hypokalemia  P -repleted    Hyperglycemia P -SSI  Acute blood loss anemia  -in setting of craniotomy 4/17   02/17/2020 - hgb 7.7gm%  P -trend CBC -- PRBC for hgb </= 6.9gm%    - exceptions are   -  if ACS susepcted/confirmed then transfuse for hgb </= 8.0gm%,  or    -  active bleeding with hemodynamic instability, then transfuse regardless of hemoglobin value   At at all times try to transfuse 1 unit prbc as possible with exception of active hemorrhage    At risk malnutrition P -TF   Best practice:  Diet: EN per RDN  Pain/Anxiety/Delirium protocol (if indicated): RASS goal 0, PRN fentanyl PRN versed VAP protocol (if indicated): yes DVT prophylaxis: SQ heparin  GI prophylaxis: protonix  Glucose control: SSI Mobility: BR Code Status: Full  Family Communication:  Long talk join MD discussion DR Daisy Blossom + Dr Marchelle Gearing -> with patient daughter Archie Patten and friend on speaker phone John. Very poor neuro prognosis explained. For now No  CPR. No ACLS. Will get CT and over next few to several days decide on comfort care v trach/LTAC   Disposition: ICU     ATTESTATION & SIGNATURE   The patient Divina Neale is critically ill with multiple organ systems failure and requires high complexity decision making for assessment and support, frequent evaluation and titration of therapies, application of advanced monitoring technologies and extensive interpretation of multiple databases.   Critical Care Time devoted to patient care services described in this note is  30  Minutes. This time reflects time of care of this signee Dr Kalman Shan. This critical care time does not reflect procedure time, or teaching time or supervisory time of PA/NP/Med student/Med Resident etc but could involve care discussion time     Dr. Kalman Shan, M.D., [facility].C.P Pulmonary and Critical Care Medicine Staff Physician [facility] Pager: [phone], If no answer or between  15:00h - 7:00h: call [phone]  02/17/2020 11:58 AM    LABS    PULMONARY No results for input(s): PHART, PCO2ART, PO2ART, HCO3, TCO2, O2SAT in the last 168 hours.  Invalid input(s): PCO2, PO2  CBC Recent Labs  Lab 02/15/20 0516 02/16/20 0533 02/17/20 0623  HGB 8.1* 8.1* 7.7*  HCT 28.5* 28.7* 27.0*  WBC 12.4* 16.4* 16.1*  PLT 276 304 310    COAGULATION No results for input(s): INR in the last 168 hours.  CARDIAC  No results for input(s): TROPONINI in the last 168 hours. No results for input(s): PROBNP in the last 168 hours.   CHEMISTRY Recent Labs  Lab 02/11/20 0202 02/11/20 0741 02/12/20 0551 02/12/20 0820 02/13/20 0347 02/13/20 0815 02/14/20 0409 02/14/20 0857 02/15/20 0516 02/15/20 1254 02/16/20 0533 02/16/20 1200 02/16/20 1730 02/16/20 2330 02/17/20 0623  NA 157*   < > 160*   < > 158*   < > 154*   < > 158*   < > 158* 159* 161* 159* 158*  K 3.5  --  3.0*   < > 3.6  --  3.3*  --  3.7   --  3.2*  --   --   --  3.4*  CL >130*  --  127*   < > 127*  --  121*  --  127*  --  123*  --   --   --  124*  CO2 21*  --  24   < > 24  --  25  --  25  --  27  --   --   --  27  GLUCOSE 118*  --  153*   < > 120*  --  147*  --  152*  --  150*  --   --   --  133*  BUN 20  --  29*   < > 35*  --  40*  --  43*  --  41*  --   --   --  35*  CREATININE 0.81  --  0.72   < > 0.76  --  0.77  --  0.70  --  0.71  --   --   --  0.77  CALCIUM 7.8*  --  8.5*   < > 8.5*  --  8.3*  --  8.5*  --  8.4*  --   --   --  8.3*  MG 2.1  --  2.2  --  2.1  --  2.1  --   --   --   --   --   --   --  2.3  PHOS 2.2*  --  1.8*   < > 2.3*  --  2.3*  --  2.3*  --  3.7  --   --   --  2.5   < > = values in this interval not displayed.   Estimated Creatinine Clearance: 85 mL/min (by C-G formula based on SCr of 0.77 mg/dL).   LIVER Recent Labs  Lab 02/17/20 0623  AST 28  ALT 32  ALKPHOS 72  BILITOT 0.6  PROT 5.1*  ALBUMIN 1.5*     INFECTIOUS Recent Labs  Lab 02/14/20 0900 02/14/20 1031 02/15/20 0516 02/16/20 0533 02/16/20 2349  LATICACIDVEN  --  1.8  --   --  1.1  PROCALCITON 0.12  --  0.11 <0.10  --      ENDOCRINE CBG (last 3)  Recent Labs    02/16/20 2350 02/17/20 0400 02/17/20 0818  GLUCAP 114* 120* 123*         IMAGING x48h  - image(s) personally visualized  -   highlighted in bold DG CHEST PORT 1 VIEW  Result Date: 02/16/2020 CLINICAL DATA:  Intubated. EXAM: PORTABLE CHEST 1 VIEW COMPARISON:  Earlier today. FINDINGS: Endotracheal tube in satisfactory position. Right jugular catheter tip in the superior vena cava. Feeding tube extending into the stomach. Stable enlarged cardiac silhouette. No significant change in dense patchy opacity in the right mid lung zone. Slightly improved patchy opacity in the left lower lobe with some linear density currently demonstrated. Small right pleural effusion. Mild interval linear density at the right lung base. Thoracic spine degenerative changes.  IMPRESSION: 1. Stable dense patchy opacity in the right mid lung zone, suspicious for pneumonia. 2. Slightly improved patchy atelectasis or pneumonia in the left lower lobe. 3. Mild interval right basilar atelectasis. 4. Small right pleural effusion. 5. Stable cardiomegaly. Electronically Signed   By: Beckie Salts M.D.   On: 02/16/2020 09:26   DG CHEST PORT 1 VIEW  Result Date: 02/16/2020 CLINICAL DATA:  Respiratory failure. EXAM: PORTABLE CHEST 1 VIEW COMPARISON:  02/13/2020 FINDINGS: The endotracheal tube is now 9 cm above the carina and just at the thoracic inlet. The feeding tube is stable. The right IJ catheter is stable. Worsening/progressive right lung infiltrate. Persistent left basilar infiltrate. No large pleural effusions or pneumothorax.  IMPRESSION: 1. Endotracheal tube is now 9 cm above the carina and just at the thoracic inlet. 2. Worsening right lung infiltrate. Electronically Signed   By: Rudie MeyerP.  Gallerani M.D.   On: 02/16/2020 07:50

## 2020-02-17 NOTE — Procedures (Signed)
Endotracheal Intubation Procedure Note  Indication for endotracheal intubation: airway compromise. Airway Assessment: Mallampati Class: ET tube exchange. Sedation: propofol. Paralytic: none. Lidocaine: no. Atropine: no. Equipment: video laryngoscope blade. Cricoid Pressure: no. Number of attempts: 1. ETT location confirmed by by auscultation and ETCO2 monitor.  Noe Gens Tawonda Legaspi 02/17/2020

## 2020-02-17 NOTE — Progress Notes (Signed)
STROKE TEAM PROGRESS NOTE   INTERVAL HISTORY Daughter at bedside. Pt still intubated on sedation. 4. neuro unchanged, not open eyes on voice or pain, slight withdraw LUE but no movement of other extremities.Had a long conversation with daughter and her boyfriend with Dr. Marchelle Gearing at the side, patient's prognosis for good quality of life is poor, daughter says patient would be against any life-saving interventions if she would have long-term significant deficits from disease and we discussed her bleed was so large that it is unlikely she will not have significant sequelae from stroke.  OBJECTIVE Vitals:   02/17/20 0828 02/17/20 0900 02/17/20 1000 02/17/20 1049  BP: (!) 129/54 (!) 160/65 (S) (!) 175/68 (!) 175/68  Pulse: 84 78 79   Resp: (!) 38 (!) 30 (!) 32   Temp:      TempSrc:      SpO2: 98% 97% 97%   Weight:      Height:       CBC:  Recent Labs  Lab 02/16/20 0533 02/17/20 0623  WBC 16.4* 16.1*  NEUTROABS 12.1* 11.1*  HGB 8.1* 7.7*  HCT 28.7* 27.0*  MCV 88.3 88.5  PLT 304 310   Basic Metabolic Panel:  Recent Labs  Lab 02/14/20 0409 02/14/20 0857 02/16/20 0533 02/16/20 1200 02/16/20 2330 02/17/20 0623  NA 154*   < > 158*   < > 159* 158*  K 3.3*   < > 3.2*  --   --  3.4*  CL 121*   < > 123*  --   --  124*  CO2 25   < > 27  --   --  27  GLUCOSE 147*   < > 150*  --   --  133*  BUN 40*   < > 41*  --   --  35*  CREATININE 0.77   < > 0.71  --   --  0.77  CALCIUM 8.3*   < > 8.4*  --   --  8.3*  MG 2.1  --   --   --   --  2.3  PHOS 2.3*   < > 3.7  --   --  2.5   < > = values in this interval not displayed.   Lipid Panel:     Component Value Date/Time   CHOL 164 01/31/2020 0347   TRIG 130 02/15/2020 1302   HDL 55 01/28/2020 0347   CHOLHDL 3.0 01/30/2020 0347   VLDL 35 02/14/2020 0347   LDLCALC 74 01/28/2020 0347   HgbA1c:  Lab Results  Component Value Date   HGBA1C 5.6 02/13/2020   Urine Drug Screen:     Component Value Date/Time   LABOPIA NONE DETECTED  02/13/2020 1704   COCAINSCRNUR NONE DETECTED 02/15/2020 1704   LABBENZ NONE DETECTED 02/07/2020 1704   AMPHETMU NONE DETECTED 02/22/2020 1704   THCU NONE DETECTED 02/08/2020 1704   LABBARB NONE DETECTED 02/11/2020 1704    Alcohol Level     Component Value Date/Time   ETH <10 02/14/2020 1516    IMAGING CT HEAD CODE STROKE WO CONTRAST 02/06/2020   IMPRESSION:  Very large acute parenchymal hemorrhage centered within the left frontal lobe measuring 8.2 x 6.5 x 5.6 cm. Surrounding parenchymal edema. Mild intraventricular extension of hemorrhage into the left foramen of Monro, third ventricle and cerebral aqueduct. Associated mass effect with partial effacement of the left lateral ventricle. 5 mm rightward midline shift at the level of the septum pellucidum.   CT Code Stroke CTA  Head W/WO contrast 02/25/2020 IMPRESSION:  1. No intracranial large vessel occlusion or proximal high-grade arterial stenosis.  2. No evidence of vascular malformation on the current exam. However, consider imaging follow-up once the hematoma involutes for further assessment.  3. No contrast blush is demonstrated in the region of the acute left frontal lobe hematoma on arterial phase imaging to suggest active extravasation.  4. No intracranial aneurysm is identified.    CT Code Stroke CTA Neck W/WO contrast 02-25-20 IMPRESSION:  The bilateral common carotid, internal carotid and vertebral arteries are patent within the neck without significant stenosis.   CT HEAD WO CONTRAST 02/19/2020 IMPRESSION:  1. Unchanged size of massive intraparenchymal hematoma centered in the left frontal lobe.  2. Increased amount of blood in the occipital horn of the right lateral ventricle.  3. Unchanged rightward midline shift, measuring 5 mm.   MR BRAIN WO CONTRAST 02/20/2020 IMPRESSION 1. Unchanged large left frontoparietal parenchymal hematoma with unchanged rightward midline shift. Mild surrounding edema and restricted  diffusion consistent with associated acute infarct. 2. Small volume intraventricular and subarachnoid hemorrhage. 3. Scattered punctate acute infarcts in both cerebral hemispheres suggesting emboli.   CT HEAD WO CONTRAST 02/12/2020 IMPRESSION:  1. Status post left frontal parietal craniotomy for evacuation of the hemorrhage. 2. Hemorrhage is significantly debulked. Morselized Gel-Foam is present within the surgical cavity. 3. No new hemorrhage. 4. Improved midline shift, now measuring 4 mm. 5. Stable intraventricular hemorrhage without hydrocephalus.  CT HEAD WO CONTRAST 02/14/2020 IMPRESSION:  1. Postoperative changes from left frontoparietal hematoma evacuation with unchanged residual blood products, edema, and 4 mm of rightward midline shift. 2. Unchanged small volume intraventricular blood products without ventriculomegaly. 3. No new intracranial abnormality.  CT Head WO Contrast - pending 02/17/2020   DG Chest Port 1 View 02-25-2020 Increasing left basilar atelectasis. Central line as described without pneumothorax.  Feb 25, 2020 Tubes and lines as described above. The gastric catheter lies in the distal esophagus and should be advanced several cm further into the stomach. Increased interstitial markings likely related to the poor inspiratory effort however mild edema cannot be excluded.  02/13/2020 No change in small left pleural effusion and adjacent Airspace disease, likely atelectasis. 02/11/2020 1. Well-positioned support structures. No pneumothorax. 2. Stable hazy bibasilar lung opacities, favor atelectasis. 3. Stable mild cardiomegaly without overt pulmonary edema.  02/13/2020 Increased bibasilar opacities question atelectasis versus infiltrate. Enlargement of cardiac silhouette with small LEFT pleural effusion. 02/15/2020 1. Endotracheal tube is now 9 cm above the carina and just at the thoracic inlet. 2. Worsening right lung infiltrate. 02/15/2020 1. Stable dense patchy  opacity in the right mid lung zone, suspicious for pneumonia. 2. Slightly improved patchy atelectasis or pneumonia in the left lower lobe. 3. Mild interval right basilar atelectasis. 4. Small right pleural effusion. 5. Stable cardiomegaly. 02/17/2020 pending   DG Abd Portable 1V 02-25-2020 IMPRESSION:  Gastric catheter as described.   Transthoracic Echocardiogram  1. Left ventricular ejection fraction, by estimation, is 65 to 70%. The left ventricle has normal function. The left ventricle has no regional wall motion abnormalities. There is mild left ventricular hypertrophy. Left ventricular diastolic parameters are indeterminate.  2. Right ventricular systolic function is normal. The right ventricular size is normal. There is moderately elevated pulmonary artery systolic pressure.  3. The mitral valve is normal in structure. Trivial mitral valve regurgitation.  4. Tricuspid valve regurgitation is moderate.  5. The aortic valve is tricuspid. Aortic valve regurgitation is trivial. No aortic stenosis is present.  LTM EEG  cortical dysfunction in left hemisphere likely secondary to underlying hemorrhage. Additionally, there is evidence of severe diffuse encephalopathy, non specific to etiology. No seizures or definite epileptiform discharges were seen throughout the recording. Event button was pressed on 03/21/2020 as described above without concomitant eeg change and was likely not epileptic.   PHYSICAL EXAM   Temp:  [99.2 F (37.3 C)-102.3 F (39.1 C)] 101.2 F (38.4 C) (04/24 0800) Pulse Rate:  [60-96] 79 (04/24 1000) Resp:  [17-38] 32 (04/24 1000) BP: (111-175)/(51-96) 175/68 (04/24 1049) SpO2:  [97 %-100 %] 97 % (04/24 1000) FiO2 (%):  [40 %-80 %] 40 % (04/24 0828)  General - Well nourished, well developed, intubated on sedation.  Ophthalmologic - fundi not visualized due to noncooperation.  Cardiovascular - Regular rate and rhythm, not in afib anymore.  Neuro - intubated on  fentanyl, eyes closed, not open on voice, not following commands. With eyes forced opening, eyes in left gaze position, not blinking to visual threat, doll's eyes sluggish, not tracking, pupil equal but sluggish to light. Corneal reflex weak bilaterally, gag and cough reflex present. Breathing over the vent.  Facial symmetry not able to test due to ET tube.  Tongue protrusion not cooperative. On pain stimulation, left UE slight withdraw but no movement on other limbs. DTR 1+ and bilateral positive babinski. Sensation, coordination and gait not tested.   ASSESSMENT/PLAN Ms. Janayah Zavada is a 64 y.o. female with history of Htn, Hld and estrogen therapy presenting with left gaze and right side flaccid. Vomited and was intubated  She did not receive IV t-PA due to ICH.  Stroke:  left MCA and bilateral ACA infarcts with large hematoma centered within the left frontal lobe likely due to hemorrhagic conversion s/p crani for hematoma evacuation - stroke etiology likely due to PAF  CT Head 02/12/2020 - Very large acute parenchymal hemorrhage centered within the left frontal lobe measuring 8.2 x 6.5 x 5.6 cm. 5 mm rightward midline shift at the level of the septum pellucidum.   CT head - 27-Feb-2020 - Unchanged size of massive intraparenchymal hematoma centered in the left frontal lobe.  MRI head - unchanged hematoma and surrounding edema. DWI signal at left MCA and ACA consistent with acute infarcts. Scattered punctate acute infarcts in both cerebral hemispheres suggesting emboli  CTA H&N - No large vessel occlusion or high-grade arterial stenosis. No evidence of vascular malformation on the current exam.  CT repeat 4/19 - significantly decreased hematoma post evacuation  CT repeat 4/21 - stable residual hematoma and 4 mm of midline shift  CT Head 4/24 - pending  2D Echo - EF 65-70%   Sars Corona Virus 2 - negative - Ab positive  LDL - 74  HgbA1c - 5.6  UDS - negative  VTE prophylaxis - heparin  subq  No antithrombotic prior to admission, now on No antithrombotic due to hemorrhage  Therapy recommendations:  Re-order once pt stable for evaluation  Disposition:  Pending  Family still need time to make decision, would like to see how she does this weekend - not against palliative care consult next week  Acute Respiratory Failure  Intubated for airway protection  On sedation   CCM on board  No extubation given worsening hypoxemia, infiltrated, increasing WBC (?PNA)  Bronch w/ ET repositioning, Cx  Family hope to avoid trach  Possible Aspiration Pneumonia Leukocytosis Fever   Nausea vomiting on presentation  Cefepime 4/16 >>4/22  CCM on board  Temp - 100.5 ->...->102.2->100.7->101.4->102.3->101.2  WBCs -  11.6->14.2->14.7->17.4->19.5->12.5->12.4->16.4->16.1  CXR 4/23 worsening R infiltrate, repeat CXR stable dense patchy opacity R mid lung ? PNA. Slightly improved atx/PNA LLL. Mild R basilar atx. Small R pleural effusion.   CXR 4/24 - pending  UA neg  Resp Cx 4/18 normal flora  Blood Cx 4/20 NGTD    Resp Cx 4/21 no growth  Resp Cx 4/24 pending   Meropenem 4/23>>  Vancomycin 4/23>>  Cerebral edema  5 mm Rightward Midline Shift on CT and MRI  NSG on board s/p hematoma evacuation 4/17   CT 4/21 stable residual hematoma and midline shift  Off 3%   23.4% saline x1  Na - 139->...->161->160->158->159->158  Na every 6h  Add free water 100cc Q4h  Atrial Fibrillation w/ RVR  New diagnosis  Given her embolic strokes on MRI, pt likely has PAF before admission  Now back to NSR  Amiodarone discontinued  On cardizem IV - transitioned to po cardizem  Cardiology consulted - appreciate help  May consider AC once blood absorbed in the future (likely months before being able to add)  UE shivering/shaking  Bilateral versus unilateral arm shaking/shivering  LTM EEG no seizure to correlate the shaking -> discontinued 4/19  Keppra - 500 mg Q  12 hrs after Keppra 2 g load  Seizure precautions  Hypertension  Home BP meds: Zestril 10 mg daily  off Cleviprex and normodyne (infusion)  On cardizem drip  Add lisinopril 10mg  bid -> off   Cardizem starts today  Stable on the high end . SBP goal < 160 mm Hg (SBP 129 - 175 today) . Long-term BP goal normotensive  Hyperlipidemia  Home Lipid lowering medication: Pravachol 20 mg daily  LDL 74, goal < 70  Current lipid lowering medication: None for now  May resume statin at discharge vs. SATURN trial  Dysphagia At risk malnutrition   N.p.o.  On tube feeding at 25 cc/h -> 45cc  Speech following  Dietitian on board  Anemia, acute blood loss  Hb - 10.8->...->8.1->7.7 (may need to consider transfusion)  Likely due to post op  S/p PRBC in OR  Iron 16 and ferritin 66  CBC monitoring  Iron pills added  Other Stroke Risk Factors  Advanced age  Morbid Obesity, Body mass index is 42.53 kg/m., recommend weight loss, diet and exercise as appropriate   On estrogen daily po PTA  Other Active Problems  Code status - Full code  Hypokalemia - treated - K 3.4->...->3.2->3.4  Hypophosphatemia - treated - Phos 1.7->...>2.3->2.5  Family requested COVID Ab testing after COVID vaccine - serology reactive  Right eyelid swollen - d/c lisinopril -> Card[facility]em [facility]  Hospital day # 8  This patient is critically ill due to large ICH, hemorrhagic conversion, hypertensive emergency, seizure, respiratory failure and at significant risk of neurological worsening, death form cerebral edema, brain herniation, hematoma expansion, recurrent stroke, epilepsy status.   This patient is critically ill and at significant risk of neurological worsening, death and care requires constant monitoring of vital signs, hemodynamics,respiratory and cardiac monitoring,review of multiple databases, neurological assessment, discussion with family, other specialists and medical  decision making of high complexity.I  I spent 60 minutes of neurocritical care time in the care of this patient.Had a long conversation with daughter and her boyfriend with Dr. Marchelle Gearingamaswamy at the side, patient's prognosis for good quality of life is poor, daughter says patient would be against any life-saving interventions if she would have long-term significant deficits from disease and we discussed her  bleed was so large that it is unlikely she will not have significant sequelae from stroke.   Naomie Dean, MD [facility]     To contact Stroke Continuity provider, please refer to [email or web address]. After hours, contact General Neurology

## 2020-02-17 NOTE — Plan of Care (Signed)
  Problem: Education: Goal: Knowledge of General Education information will improve Description: Including pain rating scale, medication(s)/side effects and non-pharmacologic comfort measures Outcome: Progressing   Problem: Clinical Measurements: Goal: Diagnostic test results will improve Outcome: Progressing Goal: Respiratory complications will improve Outcome: Progressing Goal: Cardiovascular complication will be avoided Outcome: Progressing   Problem: Activity: Goal: Risk for activity intolerance will decrease Outcome: Progressing   Problem: Nutrition: Goal: Adequate nutrition will be maintained Outcome: Progressing   Problem: Coping: Goal: Level of anxiety will decrease Outcome: Progressing   Problem: Elimination: Goal: Will not experience complications related to bowel motility Outcome: Progressing Goal: Will not experience complications related to urinary retention Outcome: Progressing   Problem: Pain Managment: Goal: General experience of comfort will improve Outcome: Progressing   Problem: Safety: Goal: Ability to remain free from injury will improve Outcome: Progressing   Problem: Skin Integrity: Goal: Risk for impaired skin integrity will decrease Outcome: Progressing   Problem: Activity: Goal: Ability to tolerate increased activity will improve Outcome: Progressing   Problem: Respiratory: Goal: Ability to maintain a clear airway and adequate ventilation will improve Outcome: Progressing   Problem: Role Relationship: Goal: Method of communication will improve Outcome: Progressing   Problem: Education: Goal: Knowledge of disease or condition will improve Outcome: Progressing Goal: Knowledge of secondary prevention will improve Outcome: Progressing Goal: Knowledge of patient specific risk factors addressed and post discharge goals established will improve Outcome: Progressing Goal: Individualized Educational Video(s) Outcome: Progressing    Problem: Coping: Goal: Will verbalize positive feelings about self Outcome: Progressing Goal: Will identify appropriate support needs Outcome: Progressing   Problem: Health Behavior/Discharge Planning: Goal: Ability to manage health-related needs will improve Outcome: Progressing   Problem: Self-Care: Goal: Ability to participate in self-care as condition permits will improve Outcome: Progressing Goal: Verbalization of feelings and concerns over difficulty with self-care will improve Outcome: Progressing Goal: Ability to communicate needs accurately will improve Outcome: Progressing   Problem: Nutrition: Goal: Risk of aspiration will decrease Outcome: Progressing Goal: Dietary intake will improve Outcome: Progressing   Problem: Intracerebral Hemorrhage Tissue Perfusion: Goal: Complications of Intracerebral Hemorrhage will be minimized Outcome: Progressing

## 2020-02-18 ENCOUNTER — Inpatient Hospital Stay (HOSPITAL_COMMUNITY): Payer: Medicare Other

## 2020-02-18 DIAGNOSIS — J9601 Acute respiratory failure with hypoxia: Secondary | ICD-10-CM | POA: Diagnosis not present

## 2020-02-18 DIAGNOSIS — J939 Pneumothorax, unspecified: Secondary | ICD-10-CM | POA: Diagnosis not present

## 2020-02-18 LAB — CBC WITH DIFFERENTIAL/PLATELET
Abs Immature Granulocytes: 0.44 10*3/uL — ABNORMAL HIGH (ref 0.00–0.07)
Basophils Absolute: 0.1 10*3/uL (ref 0.0–0.1)
Basophils Relative: 1 %
Eosinophils Absolute: 0.3 10*3/uL (ref 0.0–0.5)
Eosinophils Relative: 2 %
HCT: 27.6 % — ABNORMAL LOW (ref 36.0–46.0)
Hemoglobin: 7.9 g/dL — ABNORMAL LOW (ref 12.0–15.0)
Immature Granulocytes: 3 %
Lymphocytes Relative: 16 %
Lymphs Abs: 2.4 10*3/uL (ref 0.7–4.0)
MCH: 25.2 pg — ABNORMAL LOW (ref 26.0–34.0)
MCHC: 28.6 g/dL — ABNORMAL LOW (ref 30.0–36.0)
MCV: 87.9 fL (ref 80.0–100.0)
Monocytes Absolute: 1.1 10*3/uL — ABNORMAL HIGH (ref 0.1–1.0)
Monocytes Relative: 8 %
Neutro Abs: 10.6 10*3/uL — ABNORMAL HIGH (ref 1.7–7.7)
Neutrophils Relative %: 70 %
Platelets: 331 10*3/uL (ref 150–400)
RBC: 3.14 MIL/uL — ABNORMAL LOW (ref 3.87–5.11)
RDW: 16.4 % — ABNORMAL HIGH (ref 11.5–15.5)
WBC: 14.9 10*3/uL — ABNORMAL HIGH (ref 4.0–10.5)
nRBC: 0.3 % — ABNORMAL HIGH (ref 0.0–0.2)

## 2020-02-18 LAB — BASIC METABOLIC PANEL
Anion gap: 8 (ref 5–15)
BUN: 35 mg/dL — ABNORMAL HIGH (ref 8–23)
CO2: 25 mmol/L (ref 22–32)
Calcium: 8 mg/dL — ABNORMAL LOW (ref 8.9–10.3)
Chloride: 120 mmol/L — ABNORMAL HIGH (ref 98–111)
Creatinine, Ser: 0.77 mg/dL (ref 0.44–1.00)
GFR calc Af Amer: >60 mL/min (ref >60)
GFR calc non Af Amer: >60 mL/min (ref >60)
Glucose, Bld: 128 mg/dL — ABNORMAL HIGH (ref 70–99)
Potassium: 3.6 mmol/L (ref 3.5–5.1)
Sodium: 153 mmol/L — ABNORMAL HIGH (ref 135–145)

## 2020-02-18 LAB — CULTURE, RESPIRATORY W GRAM STAIN
Culture: NORMAL
Gram Stain: NONE SEEN

## 2020-02-18 LAB — POCT I-STAT 7, (LYTES, BLD GAS, ICA,H+H)
Acid-Base Excess: 5 mmol/L — ABNORMAL HIGH (ref 0.0–2.0)
Bicarbonate: 27.8 mmol/L (ref 20.0–28.0)
Calcium, Ion: 1.23 mmol/L (ref 1.15–1.40)
HCT: 27 % — ABNORMAL LOW (ref 36.0–46.0)
Hemoglobin: 9.2 g/dL — ABNORMAL LOW (ref 12.0–15.0)
O2 Saturation: 100 %
Patient temperature: 102.5
Potassium: 3.4 mmol/L — ABNORMAL LOW (ref 3.5–5.1)
Sodium: 157 mmol/L — ABNORMAL HIGH (ref 135–145)
TCO2: 29 mmol/L (ref 22–32)
pCO2 arterial: 37.4 mmHg (ref 32.0–48.0)
pH, Arterial: 7.486 — ABNORMAL HIGH (ref 7.350–7.450)
pO2, Arterial: 163 mmHg — ABNORMAL HIGH (ref 83.0–108.0)

## 2020-02-18 LAB — CULTURE, BLOOD (ROUTINE X 2)
Culture: NO GROWTH
Culture: NO GROWTH
Special Requests: ADEQUATE
Special Requests: ADEQUATE

## 2020-02-18 LAB — SODIUM
Sodium: 154 mmol/L — ABNORMAL HIGH (ref 135–145)
Sodium: 156 mmol/L — ABNORMAL HIGH (ref 135–145)

## 2020-02-18 LAB — GLUCOSE, CAPILLARY
Glucose-Capillary: 117 mg/dL — ABNORMAL HIGH (ref 70–99)
Glucose-Capillary: 125 mg/dL — ABNORMAL HIGH (ref 70–99)
Glucose-Capillary: 129 mg/dL — ABNORMAL HIGH (ref 70–99)
Glucose-Capillary: 127 mg/dL — ABNORMAL HIGH (ref 70–99)
Glucose-Capillary: 117 mg/dL — ABNORMAL HIGH (ref 70–99)

## 2020-02-18 LAB — TRIGLYCERIDES: Triglycerides: 135 mg/dL (ref <150)

## 2020-02-18 LAB — MAGNESIUM: Magnesium: 2.3 mg/dL (ref 1.7–2.4)

## 2020-02-18 LAB — PHOSPHORUS: Phosphorus: 2.5 mg/dL (ref 2.5–4.6)

## 2020-02-18 MED ORDER — POTASSIUM CHLORIDE 10 MEQ/50ML IV SOLN
10.0000 meq | INTRAVENOUS | Status: AC
  Administered 2020-02-18 (×4): 10 meq via INTRAVENOUS
  Filled 2020-02-18 (×4): qty 50

## 2020-02-18 MED ORDER — MIDAZOLAM HCL 2 MG/2ML IJ SOLN
INTRAMUSCULAR | Status: AC
  Filled 2020-02-18: qty 4

## 2020-02-18 MED ORDER — FUROSEMIDE 10 MG/ML IJ SOLN
40.0000 mg | Freq: Once | INTRAMUSCULAR | Status: AC
  Administered 2020-02-18: 40 mg via INTRAVENOUS
  Filled 2020-02-18: qty 4

## 2020-02-18 NOTE — Procedures (Addendum)
Chest Tube Insertion Procedure Note  Indications:  Clinically significant Pneumothorax on right side on ventilator. Small  No BP/HR consequence   Pre-operative Diagnosis: Pneumothorax  Post-operative Diagnosis: Pneumothorax  Procedure Details  Informed consent was obtained for the procedure, including sedation - from daughter over phone. PAtient is in ICU.  Risks of lung perforation, hemorrhage, arrhythmia, and adverse drug reaction were discussed.   Time out performed +  After sterile skin prep, using standard technique, a  Wayne cath using seldginger technique and using US guidance to confirm wire etnry through the pleural surface was placed in the right anterior 2nd or 3rd rib space.  Findings: None  Estimated Blood Loss:  none         Specimens:  None              Complications:  None; patient tolerated the procedure well.         Disposition: ICU - intubated and critically ill.         Condition: stable  Attending Attestation: I performed the procedure.   STat CXR ordered     SIGNATURE    Dr. Kalman Shan, M.D., F.C.C.P,  Pulmonary and Critical Care Medicine Staff Physician, [facility] Center Director - Interstitial Lung Disease  Program  Pulmonary Fibrosis [facility] at [facility] [place], [place], [address]  Pager: [phone], If no answer or between  15:00h - 7:00h: call [phone] Telephone: [phone]  3:21 PM 02/18/2020

## 2020-02-18 NOTE — Progress Notes (Signed)
NAME:  Meredith Cooper, MRN:  [id number], DOB:  [birth year], LOS: 9 ADMISSION DATE:  02/06/2020, CONSULTATION DATE:  02/21/2020 REFERRING MD:  Leonie Man - neuro, CHIEF COMPLAINT:  Code stroke - L gaze deviation, R sided weakness   Brief History   64 yowf never smoker  presented to [facility] ED 4/16 as code stroke with sudden onset R sided weakness, L gaze deviation. Rapid deterioration in ED; patient vomited multiple times in CT, then required intubation for airway protection. CTH with large acute parenchymal hemorrhage in L frontal lobe, 8.2 x 6.5 x 5.6 cm, 80m midline shift.   Past Medical History  HTN HLD Hiatal hernia GERD S/p hysterectomy   Significant Hospital Events   4/16 Code stroke, large ICH, Intubated.  4/17 overnight possible seizure activity, received ativan and started Keppra. Max on cleviprex, added PRN hydralazine for SBP goal <140 4/17  no sz 4/17 pm craniotomy for hematoma evacuation  4/18 AFRVR 4/21 Very good tidals and mechanics on SBT -  Propofol restarted overnight at low dose, Received lasix for HTN  Overnight, 1x fever 102.2, received APAP Overnight some Afib/flutter  This morning, propofol is off Did brief vent wean PSV/CPAP to assess mechanics -- adequate SBT with very good volumes   4/22 - - fever curve down. WBC plateau. PCT 0.11. Trach aspirate negative so far. Doing PSV. 3% saline stopped following severe anasarca. RN reporting sudden left orbital swelling and left lip and tongue.  Daughter concurs though thinks is dependency related. PEr RN Neuro MD felt same x 24h  . Patient noticed be on lisinoprl. Daughter  4/23 -  ET tube 9cm above carina. Increased R sided infiltrates. C/o mucus plugging. Afebrile but wbc up. Trach secretions +.  no growth so far. Fio2 up at 60%. Angioedema improved -> reintubted and s.p bronch  4/24 - cards changed to poi cardizem. Gets vent dysnchrony off fentanyl. STill febrile. Neuro feels prognosis bad. Family conference held with patient.  Joined by her friend JJenny Reichmannon speaker phone. On mucomyst   [facility]  [facility]  Stroke team   Procedures:  4/16 ETT >  4/16 CVC>> 4/16 Arterial line 4/17 Craniotomy/ no drain  Significant Diagnostic Tests:  4/16 CT H> Large acute intraparenchymal hemorrhage centered in L frontal lobe 8.2 x 6.5 x 5.6 cm, 582mmidline shift.  4/16 CTA H> no aneurysm  4/17 CT H> unchanged size of massive IPH centered in L frontal lobe. Interval increase in amount of blood in R lateral ventricle occipital horn. 32m60midline shift  4/17 MRI Brain >>  1. Unchanged large left frontoparietal parenchymal hematoma with unchanged rightward midline shift. Mild surrounding edema and restricted diffusion consistent with associated acute infarct. 2. Small volume intraventricular and subarachnoid hemorrhage. 3. Scattered punctate acute infarcts in both cerebral hemispheres suggesting emboli. - echo 4/17 with mod PAS elevation/ mild LVH 4/19 CT Head - s/p left frontal parietal craniotomy. No new hemorrhage. Improved midline shift, current 4mm60mtable IVH without hydrocephalus 4/19 EEG -cortical dysfunction in left hemisphere likely secondary to underlying hemorrhage. Additionally, there is evidence of severe diffuse encephalopathy, non specific to etiology.No seizures ordefiniteepileptiform discharges were seen throughout the recording.    Micro Data:  4/16 SARS Cov2   neg  4/16  MRSA PCR  neg  4/18 Respiratory culture-rare GPC's, normal flora  xxxx 4/.20 blood - neg so far 4/.23  - bronch aspiration - neg so far 4/24 BAL - neg so far  Antimicrobials:  4/16 cefepime>> added for possible  aspiration event , end 4/22 4/17 Vanc x1 for OR xxx 4/23 - vanc  4/23 - merrem  Interim history/subjective:    02/18/2020  -Limited code now. Off fent gtt. On diprivan gtt. On WUA off diprivan withdrew legs and opened eyes but did not follow commands. Daughter noit at  Bedside. Still frebrile . Tmax  102.10F   Objective   Blood pressure (!) 145/74, pulse 76, temperature (!) 101.1 F (38.4 C), temperature source Axillary, resp. rate (!) 27, height _0  (1.626 m), weight 112.4 kg, SpO2 98 %.    Vent Mode: PRVC FiO2 (%):  [40 %] 40 % Set Rate:  [20 bmp] 20 bmp Vt Set:  [440 mL] 440 mL PEEP:  [5 cmH20] 5 cmH20 Plateau Pressure:  [12 cmH20-28 cmH20] 18 cmH20   Intake/Output Summary (Last 24 hours) at 02/18/2020 1100 Last data filed at 02/18/2020 0800 Gross per 24 hour  Intake 2293.11 ml  Output 2125 ml  Net 168.11 ml   Filed Weights   02/15/20 0500 02/16/20 0500 02/17/20 2200  Weight: 112.6 kg 112.4 kg 112.4 kg   General Appearance:  Looks criticall ill OBESE - + with 3rd spacing + Head:  Normocephalic, without obvious abnormality, atraumatic Eyes:  PERRL - yes, conjunctiva/corneas - muddy     Ears:  Normal external ear canals, both ears Nose:  G tube - no Throat:  ETT TUBE - yes , OG tube - yes Neck:  Supple,  No enlargement/tenderness/nodules Lungs: Clear to auscultation bilaterally, Ventilator   Synchrony - yes Heart:  S1 and S2 normal, no murmur, CVP - no.  Pressors - no Abdomen:  Soft, no masses, no organomegaly Genitalia / Rectal:  Not done Extremities:  Extremities- intact with edema Skin:  ntact in exposed areas . Sacral area - none Neurologic:  Sedation - diprivan gtt -> RASS - -4        Resolved Hospital Problem list    Possible angioedema 02/15/20  - hard to differentiate between anasarca . Sudden per RN  - resolved 4/24  Plan  - dc home lisinopril being given and stay off  Assessment & Plan:   Acute hypoxic respiratory failure with failure to protect airway, likely aspiration event, in setting of intraparenchymal hemorrhage  -intubated in ED with failed airway protection after vomiting - s/p reintubation 02/16/20   02/18/2020 - > does not meet criteria for SBT/Extubation in setting of Acute Respiratory Failure due to coma and VAP  -  P -  Continue vent support - mental status will preclude extubation efforts - Depending on family wishes/neuro prognostication, this may be a candidate for early tracheostomy. But neuro prognosis poor - VAP bundle, pulm hygiene  - PRN CXR  - PAD fentnayl versed   Possible/ High prob VAP 02/16/20  - 02/18/2020 - still febrile. Improved mucus after mucomyst and abx. Cutlure neg so far  Plan Dc vanc Continue meerrem  await culture  Sedation needs on ventilator   - RASS -3 curerntly  Plan  - fent prn   - versed prn  - continue diprivan gtt  - RASS goal 0 to -2    Large ICH with cytotoxic cerebral edema  Possible seizure activity  -s/p craniotomy pm 4/17 s drain -S/p 3% saline ended 02/15/20  - Na 157 on 02/17/20. CT head with stable findings and 49m midline shift  P -CVA per neuro, s/p crani per NSGY -SBP goal < 160 -BID keppra  - neuro family meeting 02/19/20 per RN  Volume overload 02/14/20 aonwards with 3% saline ending 4/22  - last lasix 4/22 or 4/23. . Still with 3rd spacing on 02/18/20  Plan  - monitor Na now off 3% saline - lasix x 1 02/18/20  Atrial Fibrillation with RVRL: -possibly underlying PAF vs new Afib in setting of acute critical illness  HTN:  echo 4/17 with mod PAS elevation/ mild LVH  - A Fib continues 02/18/20 and cards changed to po cardizem and signed off 02/17/20  P -po cardizem -will appreciate neurology RE timing of anticoagulation initiation - check 12 leakd ekg 4/26   Electrolyte imbalance  4/25 - low K, highNa  P -replete K  - monitor Na    Hyperglycemia P -SSI  Acute blood loss anemia  -in setting of craniotomy 4/17   02/18/2020 - hgb 9.2 gm% and stable without bleed  P -trend CBC -- PRBC for hgb </= 6.9gm%    - exceptions are   -  if ACS susepcted/confirmed then transfuse for hgb </= 8.0gm%,  or    -  active bleeding with hemodynamic instability, then transfuse regardless of hemoglobin value   At at all times try to  transfuse 1 unit prbc as possible with exception of active hemorrhage    At risk malnutrition P -TF   Best practice:  Diet: EN per RDN  Pain/Anxiety/Delirium protocol (if indicated): RASS goal 0, PRN fentanyl PRN versed VAP protocol (if indicated): yes DVT prophylaxis: SQ heparin  GI prophylaxis: protonix  Glucose control: SSI Mobility: BR Code Status: Full  Family Communication:    4/24 - Long talk join MD discussion DR Lavell Anchors + Dr Chase Caller -> with patient daughter Kenney Houseman and friend on speaker phone John. Very poor neuro prognosis explained. For now No CPR. No ACLS. Will get CT and over next few to several days decide on comfort care v trach/LTAC   4/25 - no one at bedside. PRimary communication by neuro   Disposition: ICU      ATTESTATION & SIGNATURE   The patient Meredith Cooper is critically ill with multiple organ systems failure and requires high complexity decision making for assessment and support, frequent evaluation and titration of therapies, application of advanced monitoring technologies and extensive interpretation of multiple databases.   Critical Care Time devoted to patient care services described in this note is  31  Minutes. This time reflects time of care of this signee Dr Brand Males. This critical care time does not reflect procedure time, or teaching time or supervisory time of PA/NP/Med student/Med Resident etc but could involve care discussion time     Dr. Brand Males, M.D., [facility].C.P Pulmonary and Critical Care Medicine Staff Physician [facility] Pager: [phone], If no answer or between  15:00h - 7:00h: call [phone]  02/18/2020 11:11 AM    LABS    PULMONARY Recent Labs  Lab 02/18/20 0557  PHART 7.486*  PCO2ART 37.4  PO2ART 163*  HCO3 27.8  TCO2 29  O2SAT 100.0    CBC Recent Labs  Lab 02/16/20 0533 02/16/20 0533 02/17/20 0623 02/18/20 0507 02/18/20 0557  HGB  8.1*   < > 7.7* 7.9* 9.2*  HCT 28.7*   < > 27.0* 27.6* 27.0*  WBC 16.4*  --  16.1* 14.9*  --   PLT 304  --  310 331  --    < > = values in this interval not displayed.    COAGULATION No results for  input(s): INR in the last 168 hours.  CARDIAC  No results for input(s): TROPONINI in the last 168 hours. No results for input(s): PROBNP in the last 168 hours.   CHEMISTRY Recent Labs  Lab 02/12/20 0551 02/12/20 0820 02/13/20 0347 02/13/20 0815 02/14/20 0409 02/14/20 0857 02/15/20 0516 02/15/20 1254 02/16/20 0533 02/16/20 1200 02/17/20 0623 02/17/20 0623 02/17/20 1234 02/17/20 1830 02/17/20 2138 02/18/20 0507 02/18/20 0557  NA 160*   < > 158*   < > 154*   < > 158*   < > 158*   < > 158*   < > 156* 157* 157* 153* 157*  K 3.0*   < > 3.6  --  3.3*  --  3.7  --  3.2*  --  3.4*   < >  --   --   --  3.6 3.4*  CL 127*   < > 127*  --  121*  --  127*  --  123*  --  124*  --   --   --   --  120*  --   CO2 24   < > 24  --  25  --  25  --  27  --  27  --   --   --   --  25  --   GLUCOSE 153*   < > 120*  --  147*  --  152*  --  150*  --  133*  --   --   --   --  128*  --   BUN 29*   < > 35*  --  40*  --  43*  --  41*  --  35*  --   --   --   --  35*  --   CREATININE 0.72   < > 0.76  --  0.77  --  0.70  --  0.71  --  0.77  --   --   --   --  0.77  --   CALCIUM 8.5*   < > 8.5*  --  8.3*  --  8.5*  --  8.4*  --  8.3*  --   --   --   --  8.0*  --   MG 2.2  --  2.1  --  2.1  --   --   --   --   --  2.3  --   --   --   --  2.3  --   PHOS 1.8*   < > 2.3*  --  2.3*  --  2.3*  --  3.7  --  2.5  --   --   --   --  2.5  --    < > = values in this interval not displayed.   Estimated Creatinine Clearance: 85 mL/min (by C-G formula based on SCr of 0.77 mg/dL).   LIVER Recent Labs  Lab 02/17/20 0623  AST 28  ALT 32  ALKPHOS 72  BILITOT 0.6  PROT 5.1*  ALBUMIN 1.5*     INFECTIOUS Recent Labs  Lab 02/14/20 0900 02/14/20 1031 02/15/20 0516 02/16/20 0533 02/16/20 2349  LATICACIDVEN   --  1.8  --   --  1.1  PROCALCITON 0.12  --  0.11 <0.10  --      ENDOCRINE CBG (last 3)  Recent Labs    02/17/20 2331 02/18/20 0516 02/18/20 0817  GLUCAP 126* 117* 125*  IMAGING x48h  - image(s) personally visualized  -   highlighted in bold CT HEAD WO CONTRAST  Result Date: 02/17/2020 CLINICAL DATA:  64 year old female who presented on 02/03/2020 with large superior left hemisphere intra-axial hemorrhage. Postoperative day 7 status post craniotomy and hematoma evacuation. EXAM: CT HEAD WITHOUT CONTRAST TECHNIQUE: Contiguous axial images were obtained from the base of the skull through the vertex without intravenous contrast. COMPARISON:  Head CTs 02/14/2020 and earlier. FINDINGS: Brain: Hematoma resection cavity with decreasing gas since 02/14/2020. Continued hyperdense blood products and regional post resection fluid and/or edema. Volume of hyperdense blood appears stable since 02/14/2020. And there is trace overlying left superior subdural hematoma or dural thickening. Stable small volume subdural 6 blood along the falx. Edema and blood track toward the left caudothalamic groove, unchanged. Stable intracranial mass effect with partial effacement of the left lateral ventricle and 4-5 mm of rightward midline shift. Stable trace intraventricular hemorrhage. No ventriculomegaly. No new intracranial hemorrhage identified. No acute cortically based infarct. Stable gray-white matter differentiation in the right hemisphere and posterior fossa. Vascular: Mild Calcified atherosclerosis at the skull base. Skull: Stable left vertex craniotomy.  No new osseous finding. Sinuses/Orbits: Increased opacification of the left tympanic cavity and mastoids. New contralateral right mastoid effusion. Left nasoenteric tube is stable. Comparatively mild paranasal sinus mucosal thickening and fluid. Other: Fluid in the pharynx, intubated. Postoperative changes to the left superior scalp are stable. No new  orbit or scalp soft tissue abnormality. IMPRESSION: 1. Stable appearance status post evacuation of large left superior hemisphere intra-axial hematoma, aside from decreased pneumocephalus. 2. Stable volume of residual blood since 02/14/2020, including trace subdural and IVH components. Stable intracranial mass effect with 4 mm of rightward midline shift. 3. No new intracranial abnormality. Electronically Signed   By: Genevie Ann M.D.   On: 02/17/2020 15:02   DG CHEST PORT 1 VIEW  Result Date: 02/17/2020 CLINICAL DATA:  Code stroke.  History of end of a shin. EXAM: PORTABLE CHEST 1 VIEW COMPARISON:  February 16, 2020 FINDINGS: The ETT is in good position. The feeding tube terminates below today's film. The right IJ is in good position. No pneumothorax. Bilateral pulmonary infiltrates, right greater than left, stable. No change in the cardiomediastinal silhouette. IMPRESSION: 1. Support apparatus as above. 2. Bilateral pulmonary infiltrates, right greater than left, unchanged. Electronically Signed   By: Dorise Bullion III M.D   On: 02/17/2020 13:01

## 2020-02-18 NOTE — Progress Notes (Signed)
RT NOTES: ETT withdrawn 1cm from 24cm at the lip to 23cm at the lip per orders.

## 2020-02-18 NOTE — Progress Notes (Signed)
Patient with tachypnea in the 40s, setting off vent and not responding to prn medications or sedation. Physician notified, will continue to monitor.

## 2020-02-18 NOTE — Progress Notes (Signed)
 of fentanyl wasted in stericycle from bag with Jonelle Sidle RN.

## 2020-02-18 NOTE — Progress Notes (Signed)
RT NOTES: Tried patient on pressure support. Patient's RR increased to 48. Placed back on full support at this time.

## 2020-02-18 NOTE — Progress Notes (Signed)
STROKE TEAM PROGRESS NOTE   INTERVAL HISTORY No changes overnight, nurse at bedside, Daughter not at bedside this mornine. Repet CT Head this morning overall stable. Pt still intubated on sedation. 4. neuro unchanged, not open eyes on voice or pain, slight withdraw LUE but no movement of other extremities.Had a long conversation with daughter and her boyfriend with Dr. Marchelle Gearing at the side yesterday, patient's prognosis for good quality of life is poor, daughter says patient would be against any life-saving interventions if she would have long-term significant deficits from disease and we discussed her bleed was so large that it is unlikely she will not have significant sequelae from stroke.  OBJECTIVE Vitals:   02/18/20 1200 02/18/20 1300 02/18/20 1400 02/18/20 1500  BP: (!) 156/65 128/60 (!) 148/66 (!) 114/52  Pulse: 80 66 71 74  Resp: (!) 35 (!) 26 (!) 28 (!) 8  Temp: (!) 100.8 F (38.2 C)     TempSrc: Axillary     SpO2: 98% 98% 96% 97%  Weight:      Height:       CBC:  Recent Labs  Lab 02/17/20 0623 02/17/20 0623 02/18/20 0507 02/18/20 0557  WBC 16.1*  --  14.9*  --   NEUTROABS 11.1*  --  10.6*  --   HGB 7.7*   < > 7.9* 9.2*  HCT 27.0*   < > 27.6* 27.0*  MCV 88.5  --  87.9  --   PLT 310  --  331  --    < > = values in this interval not displayed.   Basic Metabolic Panel:  Recent Labs  Lab 02/17/20 0623 02/17/20 1234 02/18/20 0507 02/18/20 0507 02/18/20 0557 02/18/20 1240  NA 158*   < > 153*   < > 157* 154*  K 3.4*  --  3.6  --  3.4*  --   CL 124*  --  120*  --   --   --   CO2 27  --  25  --   --   --   GLUCOSE 133*  --  128*  --   --   --   BUN 35*  --  35*  --   --   --   CREATININE 0.77  --  0.77  --   --   --   CALCIUM 8.3*  --  8.0*  --   --   --   MG 2.3  --  2.3  --   --   --   PHOS 2.5  --  2.5  --   --   --    < > = values in this interval not displayed.   Lipid Panel:     Component Value Date/Time   CHOL 164 03-08-2020 0347   TRIG 135 02/18/2020  1240   HDL 55 03/08/20 0347   CHOLHDL 3.0 03/08/20 0347   VLDL 35 03/08/20 0347   LDLCALC 74 08-Mar-2020 0347   HgbA1c:  Lab Results  Component Value Date   HGBA1C 5.6 02/17/2020   Urine Drug Screen:     Component Value Date/Time   LABOPIA NONE DETECTED 01/29/2020 1704   COCAINSCRNUR NONE DETECTED 01/25/2020 1704   LABBENZ NONE DETECTED 02/15/2020 1704   AMPHETMU NONE DETECTED 01/28/2020 1704   THCU NONE DETECTED 02/21/2020 1704   LABBARB NONE DETECTED 02/12/2020 1704    Alcohol Level     Component Value Date/Time   ETH <10 02/06/2020 1516    IMAGING CT HEAD  CODE STROKE WO CONTRAST Apr 21, 2020   IMPRESSION:  Very large acute parenchymal hemorrhage centered within the left frontal lobe measuring 8.2 x 6.5 x 5.6 cm. Surrounding parenchymal edema. Mild intraventricular extension of hemorrhage into the left foramen of Monro, third ventricle and cerebral aqueduct. Associated mass effect with partial effacement of the left lateral ventricle. 5 mm rightward midline shift at the level of the septum pellucidum.   CT Code Stroke CTA Head W/WO contrast Apr 21, 2020 IMPRESSION:  1. No intracranial large vessel occlusion or proximal high-grade arterial stenosis.  2. No evidence of vascular malformation on the current exam. However, consider imaging follow-up once the hematoma involutes for further assessment.  3. No contrast blush is demonstrated in the region of the acute left frontal lobe hematoma on arterial phase imaging to suggest active extravasation.  4. No intracranial aneurysm is identified.    CT Code Stroke CTA Neck W/WO contrast Apr 21, 2020 IMPRESSION:  The bilateral common carotid, internal carotid and vertebral arteries are patent within the neck without significant stenosis.   CT HEAD WO CONTRAST 02/21/2020 IMPRESSION:  1. Unchanged size of massive intraparenchymal hematoma centered in the left frontal lobe.  2. Increased amount of blood in the occipital horn of the  right lateral ventricle.  3. Unchanged rightward midline shift, measuring 5 mm.   MR BRAIN WO CONTRAST 02/04/2020 IMPRESSION 1. Unchanged large left frontoparietal parenchymal hematoma with unchanged rightward midline shift. Mild surrounding edema and restricted diffusion consistent with associated acute infarct. 2. Small volume intraventricular and subarachnoid hemorrhage. 3. Scattered punctate acute infarcts in both cerebral hemispheres suggesting emboli.   CT HEAD WO CONTRAST 02/12/2020 IMPRESSION:  1. Status post left frontal parietal craniotomy for evacuation of the hemorrhage. 2. Hemorrhage is significantly debulked. Morselized Gel-Foam is present within the surgical cavity. 3. No new hemorrhage. 4. Improved midline shift, now measuring 4 mm. 5. Stable intraventricular hemorrhage without hydrocephalus.  CT HEAD WO CONTRAST 02/14/2020 IMPRESSION:  1. Postoperative changes from left frontoparietal hematoma evacuation with unchanged residual blood products, edema, and 4 mm of rightward midline shift. 2. Unchanged small volume intraventricular blood products without ventriculomegaly. 3. No new intracranial abnormality.  CT Head WO Contrast  02/17/2020  IMPRESSION: 1. Stable appearance status post evacuation of large left superior hemisphere intra-axial hematoma, aside from decreased pneumocephalus. 2. Stable volume of residual blood since 02/14/2020, including trace subdural and IVH components. Stable intracranial mass effect with 4 mm of rightward midline shift. 3. No new intracranial abnormality.  DG Chest Port 1 View Apr 21, 2020 Increasing left basilar atelectasis. Central line as described without pneumothorax.  Apr 21, 2020 Tubes and lines as described above. The gastric catheter lies in the distal esophagus and should be advanced several cm further into the stomach. Increased interstitial markings likely related to the poor inspiratory effort however mild edema cannot be  excluded.  02/18/2020 No change in small left pleural effusion and adjacent Airspace disease, likely atelectasis. 02/11/2020 1. Well-positioned support structures. No pneumothorax. 2. Stable hazy bibasilar lung opacities, favor atelectasis. 3. Stable mild cardiomegaly without overt pulmonary edema.  02/13/2020 Increased bibasilar opacities question atelectasis versus infiltrate. Enlargement of cardiac silhouette with small LEFT pleural effusion. 02/15/2020 1. Endotracheal tube is now 9 cm above the carina and just at the thoracic inlet. 2. Worsening right lung infiltrate. 02/15/2020 1. Stable dense patchy opacity in the right mid lung zone, suspicious for pneumonia. 2. Slightly improved patchy atelectasis or pneumonia in the left lower lobe. 3. Mild interval right basilar atelectasis. 4. Small right pleural effusion. 5.  Stable cardiomegaly. 02/18/2020 1. New right pneumothorax measuring 2.1 cm at the apex and 6 mm laterally. 2. The ETT terminates 1 cm above the carina. Recommend withdrawing 2 cm. 3. The focal infiltrate in the right mid lung is more prominent in the interval. 4. Infiltrate in left base is more prominent as well.  DG Abd Portable 1V 01/31/2020 IMPRESSION:  Gastric catheter as described.   Transthoracic Echocardiogram  1. Left ventricular ejection fraction, by estimation, is 65 to 70%. The left ventricle has normal function. The left ventricle has no regional wall motion abnormalities. There is mild left ventricular hypertrophy. Left ventricular diastolic parameters are indeterminate.  2. Right ventricular systolic function is normal. The right ventricular size is normal. There is moderately elevated pulmonary artery systolic pressure.  3. The mitral valve is normal in structure. Trivial mitral valve regurgitation.  4. Tricuspid valve regurgitation is moderate.  5. The aortic valve is tricuspid. Aortic valve regurgitation is trivial. No aortic stenosis is present.  LTM EEG   cortical dysfunction in left hemisphere likely secondary to underlying hemorrhage. Additionally, there is evidence of severe diffuse encephalopathy, non specific to etiology. No seizures or definite epileptiform discharges were seen throughout the recording. Event button was pressed on 2020-02-24 as described above without concomitant eeg change and was likely not epileptic.   PHYSICAL EXAM   Temp:  [98.7 F (37.1 C)-102.5 F (39.2 C)] 100.8 F (38.2 C) (04/25 1200) Pulse Rate:  [66-97] 74 (04/25 1500) Resp:  [8-36] 8 (04/25 1500) BP: (104-169)/(45-80) 114/52 (04/25 1500) SpO2:  [93 %-100 %] 97 % (04/25 1500) FiO2 (%):  [40 %] 40 % (04/25 1144) Weight:  [112.4 kg] 112.4 kg (04/24 2200)   Neuro stable  General - Well nourished, well developed, intubated on sedation.  Ophthalmologic - fundi not visualized due to noncooperation.  Cardiovascular - Regular rate and rhythm, not in afib anymore.  Neuro - intubated on fentanyl, eyes closed, not open on voice, not following commands. With eyes forced opening, eyes in primary position with left exotropia, not blinking to visual threat, doll's eyes sluggish, not tracking, pupil equal but sluggish to light. Corneal reflex weak bilaterally, gag and cough reflex present. Breathing over the vent.  Facial symmetry not able to test due to ET tube.  Tongue protrusion not cooperative. On pain stimulation, left UE slight withdraw but no movement on other limbs. DTR 1+ and bilateral positive babinski. Sensation, coordination and gait not tested.   ASSESSMENT/PLAN Ms. Adylene Dlugosz is a 64 y.o. female with history of Htn, Hld and estrogen therapy presenting with left gaze and right side flaccid. Vomited and was intubated  She did not receive IV t-PA due to [place].  Stroke:  left MCA and bilateral ACA infarcts with large hematoma centered within the left frontal lobe likely due to hemorrhagic conversion s/p crani for hematoma evacuation - stroke etiology likely  due to PAF  CT Head 01/31/2020 - Very large acute parenchymal hemorrhage centered within the left frontal lobe measuring 8.2 x 6.5 x 5.6 cm. 5 mm rightward midline shift at the level of the septum pellucidum.   CT head - 02-24-2020 - Unchanged size of massive intraparenchymal hematoma centered in the left frontal lobe.  MRI head - unchanged hematoma and surrounding edema. DWI signal at left MCA and ACA consistent with acute infarcts. Scattered punctate acute infarcts in both cerebral hemispheres suggesting emboli  CTA H&N - No large vessel occlusion or high-grade arterial stenosis. No evidence of vascular malformation on the  current exam.  CT repeat 4/19 - significantly decreased hematoma post evacuation  CT repeat 4/21 - stable residual hematoma and 4 mm of midline shift  CT Head 4/24 - Stable appearance status post evacuation of large left superior hemisphere intra-axial hematoma, aside from decreased pneumocephalus.  2D Echo - EF 65-70%   [employer] Virus 2 - negative - Ab positive  LDL - 74  HgbA1c - 5.6  UDS - negative  VTE prophylaxis - heparin subq  No antithrombotic prior to admission, now on No antithrombotic due to hemorrhage  Therapy recommendations:  Re-order once pt stable for evaluation  Disposition:  Pending  Family still need time to make decision, would like to see how she does this weekend - not against palliative care consult next week  Acute Respiratory Failure  Intubated for airway protection  On sedation   CCM on board  No extubation given worsening hypoxemia, infiltrated, increasing WBC (?PNA)  Bronch w/ ET repositioning, Cx  Family hope to avoid trach  Possible Aspiration Pneumonia Leukocytosis Fever   Nausea vomiting on presentation  Cefepime 4/16 >>4/22  CCM on board  Temp - 100.5 ->...->102.2->100.7->101.4->102.3->101.2->100.8  WBCs - 11.6->14.2->14.7->17.4->19.5->12.5->12.4->16.4->16.1->14.9  CXR 4/23 worsening R infiltrate,  repeat CXR stable dense patchy opacity R mid lung ? PNA. Slightly improved atx/PNA LLL. Mild R basilar atx. Small R pleural effusion.   CXR 4/25 - New right pneumothorax measuring 2.1 cm at the apex and 6 mm laterally  UA neg  Resp Cx 4/18 normal flora  Blood Cx 4/20 NGTD    Resp Cx 4/21 no growth  Resp Cx 4/23 normal  Meropenem 4/23>>  Vancomycin 4/23>>  Cerebral edema  5 mm Rightward Midline Shift on CT and MRI  NSG on board s/p hematoma evacuation 4/17   CT 4/21 stable residual hematoma and midline shift  Off 3%   23.4% saline x1  Na - 139->...->161->160->158->159->158->154  Na every 6h  Add free water 100cc Q4h  Atrial Fibrillation w/ RVR  New diagnosis  Given her embolic strokes on MRI, pt likely has PAF before admission  Now back to NSR  Amiodarone discontinued  On cardizem IV - transitioned to po cardizem  Cardiology consulted - appreciate help  May consider AC once blood absorbed in the future (likely months before being able to add)  UE shivering/shaking  Bilateral versus unilateral arm shaking/shivering  LTM EEG no seizure to correlate the shaking -> discontinued 4/19  Keppra - 500 mg Q 12 hrs after Keppra 2 g load  Seizure precautions  Hypertension  Home BP meds: Zestril 10 mg daily  off Cleviprex and normodyne (infusion)  On cardizem drip  Add lisinopril 10mg  bid -> off   Cardizem starts today  Stable on the high end . SBP goal < 160 mm Hg (SBP 129 - 175 today) . Long-term BP goal normotensive  Hyperlipidemia  Home Lipid lowering medication: Pravachol 20 mg daily  LDL 74, goal < 70  Current lipid lowering medication: None for now  May resume statin at discharge vs. SATURN trial  Dysphagia At risk malnutrition   N.p.o.  On tube feeding at 25 cc/h -> 45cc  Speech following  Dietitian on board  Anemia, acute blood loss  Hb - 10.8->...->8.1->7.7 (may need to consider transfusion)->9.2  Likely due to post  op  S/p PRBC in OR  Iron 16 and ferritin 66  CBC monitoring  Iron pills added  Other Stroke Risk Factors  Advanced age  Morbid Obesity, Body mass index  is 42.53 kg/m., recommend weight loss, diet and exercise as appropriate   On estrogen daily po PTA  Other Active Problems  Code status - Limited Code  Hypokalemia - treated - K 3.4->...->3.2->3.4->3.6->3.4  Hypophosphatemia - treated - Phos 1.7->...>2.3->2.5  Family requested COVID Ab testing after COVID vaccine - serology reactive  Right eyelid swollen - d/c lisinopril -> Cardizem ordered by Dr [facility] [place] [place] day # 9  This patient is critically ill due to large ICH, hemorrhagic conversion, hypertensive emergency, seizure, respiratory failure and at significant risk of neurological worsening, death form cerebral edema, brain herniation, hematoma expansion, recurrent stroke, epilepsy status.   This patient is critically ill and at significant risk of neurological worsening, death and care requires constant monitoring of vital signs, hemodynamics,respiratory and cardiac monitoring,review of multiple databases, neurological assessment, discussion with family, other specialists and medical decision making of high complexity.I  I spent 30 minutes of neurocritical care time in the care of this patient.Had a long conversation with daughter and her boyfriend with Dr. Marchelle Gearing at the side, patient's prognosis for good quality of life is poor, daughter says patient would be against any life-saving interventions if she would have long-term significant deficits from disease and we discussed her bleed was so large that it is unlikely she will not have significant sequelae from stroke.        To contact Stroke Continuity provider, please refer to [email or web address]. After hours, contact General Neurology

## 2020-02-18 NOTE — Progress Notes (Signed)
Radiologist called this RN and notified of a new R pneumo, and the ETT needs to come out 2 cm.  Dr. Marchelle Gearing notified.

## 2020-02-18 NOTE — Progress Notes (Signed)
eLink Physician-Brief Progress Note Patient Name: Meredith Cooper DOB: [birth year] MRN: [id number]   Date of Service  02/18/2020  HPI/Events of Note  Patient is overbreathing the vent. Set rate 20, but breathing at 36. Regular respirations. Febrile to 102.5 F. Received tylenol a short while ago.   eICU Interventions  Ordered ABG to rule out a metabolic cause of increased ventilatory requirements.  Treat fever (tylenol and cooling blanket).  Further management pending information from ABG.     Intervention Category Major Interventions: Respiratory failure - evaluation and management  Janae Bridgeman 02/18/2020, 5:39 AM

## 2020-02-19 ENCOUNTER — Inpatient Hospital Stay (HOSPITAL_COMMUNITY): Payer: Medicare Other

## 2020-02-19 DIAGNOSIS — Z978 Presence of other specified devices: Secondary | ICD-10-CM | POA: Diagnosis not present

## 2020-02-19 DIAGNOSIS — I619 Nontraumatic intracerebral hemorrhage, unspecified: Secondary | ICD-10-CM | POA: Diagnosis present

## 2020-02-19 DIAGNOSIS — Z515 Encounter for palliative care: Secondary | ICD-10-CM | POA: Diagnosis not present

## 2020-02-19 DIAGNOSIS — I480 Paroxysmal atrial fibrillation: Secondary | ICD-10-CM | POA: Diagnosis not present

## 2020-02-19 DIAGNOSIS — I6389 Other cerebral infarction: Secondary | ICD-10-CM | POA: Diagnosis not present

## 2020-02-19 DIAGNOSIS — J9601 Acute respiratory failure with hypoxia: Secondary | ICD-10-CM | POA: Diagnosis not present

## 2020-02-19 LAB — COMPREHENSIVE METABOLIC PANEL
ALT: 37 U/L (ref 0–44)
AST: 33 U/L (ref 15–41)
Albumin: 1.4 g/dL — ABNORMAL LOW (ref 3.5–5.0)
Alkaline Phosphatase: 73 U/L (ref 38–126)
Anion gap: 8 (ref 5–15)
BUN: 35 mg/dL — ABNORMAL HIGH (ref 8–23)
CO2: 27 mmol/L (ref 22–32)
Calcium: 7.9 mg/dL — ABNORMAL LOW (ref 8.9–10.3)
Chloride: 120 mmol/L — ABNORMAL HIGH (ref 98–111)
Creatinine, Ser: 0.73 mg/dL (ref 0.44–1.00)
GFR calc Af Amer: >60 mL/min (ref >60)
GFR calc non Af Amer: >60 mL/min (ref >60)
Glucose, Bld: 152 mg/dL — ABNORMAL HIGH (ref 70–99)
Potassium: 3.5 mmol/L (ref 3.5–5.1)
Sodium: 155 mmol/L — ABNORMAL HIGH (ref 135–145)
Total Bilirubin: 0.4 mg/dL (ref 0.3–1.2)
Total Protein: 5.1 g/dL — ABNORMAL LOW (ref 6.5–8.1)

## 2020-02-19 LAB — CBC
HCT: 25.9 % — ABNORMAL LOW (ref 36.0–46.0)
Hemoglobin: 7.5 g/dL — ABNORMAL LOW (ref 12.0–15.0)
MCH: 25.1 pg — ABNORMAL LOW (ref 26.0–34.0)
MCHC: 29 g/dL — ABNORMAL LOW (ref 30.0–36.0)
MCV: 86.6 fL (ref 80.0–100.0)
Platelets: 325 10*3/uL (ref 150–400)
RBC: 2.99 MIL/uL — ABNORMAL LOW (ref 3.87–5.11)
RDW: 16 % — ABNORMAL HIGH (ref 11.5–15.5)
WBC: 15.5 10*3/uL — ABNORMAL HIGH (ref 4.0–10.5)
nRBC: 0.2 % (ref 0.0–0.2)

## 2020-02-19 LAB — MAGNESIUM: Magnesium: 2.3 mg/dL (ref 1.7–2.4)

## 2020-02-19 LAB — GLUCOSE, CAPILLARY
Glucose-Capillary: 110 mg/dL — ABNORMAL HIGH (ref 70–99)
Glucose-Capillary: 121 mg/dL — ABNORMAL HIGH (ref 70–99)
Glucose-Capillary: 125 mg/dL — ABNORMAL HIGH (ref 70–99)
Glucose-Capillary: 132 mg/dL — ABNORMAL HIGH (ref 70–99)
Glucose-Capillary: 137 mg/dL — ABNORMAL HIGH (ref 70–99)
Glucose-Capillary: 144 mg/dL — ABNORMAL HIGH (ref 70–99)

## 2020-02-19 LAB — PHOSPHORUS: Phosphorus: 3 mg/dL (ref 2.5–4.6)

## 2020-02-19 LAB — SODIUM
Sodium: 155 mmol/L — ABNORMAL HIGH (ref 135–145)
Sodium: 154 mmol/L — ABNORMAL HIGH (ref 135–145)
Sodium: 150 mmol/L — ABNORMAL HIGH (ref 135–145)

## 2020-02-19 MED ORDER — FUROSEMIDE 10 MG/ML IJ SOLN
40.0000 mg | Freq: Once | INTRAMUSCULAR | Status: AC
  Administered 2020-02-19: 15:00:00 40 mg via INTRAVENOUS
  Filled 2020-02-19: qty 4

## 2020-02-19 MED ORDER — POTASSIUM CHLORIDE 20 MEQ/15ML (10%) PO SOLN
40.0000 meq | Freq: Once | ORAL | Status: AC
  Administered 2020-02-19: 40 meq
  Filled 2020-02-19: qty 30

## 2020-02-19 MED ORDER — WHITE PETROLATUM EX OINT
TOPICAL_OINTMENT | CUTANEOUS | Status: AC
  Filled 2020-02-19: qty 28.35

## 2020-02-19 NOTE — Progress Notes (Signed)
STROKE TEAM PROGRESS NOTE   INTERVAL HISTORY Patient's daughter is at the bedside.  Her neurological condition remains unchanged.  She is aphasic and hemiplegic and barely opens eyes and does not follow any commands.  Blood pressure adequately controlled. White count remains high.  She is on day 4 of meropenem.  She does have low-grade fever yet. OBJECTIVE Vitals:   02/19/20 0600 02/19/20 0604 02/19/20 0700 02/19/20 0800  BP: (!) 145/57  (!) 133/56 (!) 157/66  Pulse: 72 84 74 79  Resp: (!) 28 (!) 38 (!) 27 (!) 32  Temp:    100.3 F (37.9 C)  TempSrc:    Axillary  SpO2: 96% 96% 96% 96%  Weight:      Height:       CBC:  Recent Labs  Lab 02/17/20 0623 02/17/20 0623 02/18/20 0507 02/18/20 0507 02/18/20 0557 02/19/20 0110  WBC 16.1*   < > 14.9*  --   --  15.5*  NEUTROABS 11.1*  --  10.6*  --   --   --   HGB 7.7*   < > 7.9*   < > 9.2* 7.5*  HCT 27.0*   < > 27.6*   < > 27.0* 25.9*  MCV 88.5   < > 87.9  --   --  86.6  PLT 310   < > 331  --   --  325   < > = values in this interval not displayed.   Basic Metabolic Panel:  Recent Labs  Lab 02/18/20 0507 02/18/20 0507 02/18/20 0557 02/18/20 1240 02/19/20 0110 02/19/20 0415  NA 153*   < > 157*   < > 155* 155*  K 3.6   < > 3.4*  --   --  3.5  CL 120*  --   --   --   --  120*  CO2 25  --   --   --   --  27  GLUCOSE 128*  --   --   --   --  152*  BUN 35*  --   --   --   --  35*  CREATININE 0.77  --   --   --   --  0.73  CALCIUM 8.0*  --   --   --   --  7.9*  MG 2.3  --   --   --   --  2.3  PHOS 2.5  --   --   --   --  3.0   < > = values in this interval not displayed.   Lipid Panel:     Component Value Date/Time   CHOL 164 01/25/2020 0347   TRIG 135 02/18/2020 1240   HDL 55 02/13/2020 0347   CHOLHDL 3.0 02/12/2020 0347   VLDL 35 01/31/2020 0347   LDLCALC 74 02/15/2020 0347   HgbA1c:  Lab Results  Component Value Date   HGBA1C 5.6 Mar 09, 2020   Urine Drug Screen:     Component Value Date/Time   LABOPIA NONE  DETECTED 09-Mar-2020 1704   COCAINSCRNUR NONE DETECTED 03/09/2020 1704   LABBENZ NONE DETECTED 03-09-20 1704   AMPHETMU NONE DETECTED 03/09/2020 1704   THCU NONE DETECTED 03/09/20 1704   LABBARB NONE DETECTED 03-09-2020 1704    Alcohol Level     Component Value Date/Time   ETH <10 03/09/2020 1516    IMAGING DG CHEST PORT 1 VIEW  Result Date: 02/19/2020 CLINICAL DATA:  ET tube EXAM: PORTABLE CHEST 1 VIEW COMPARISON:  02/18/2020 FINDINGS: Endotracheal tube, right central line, feeding tube and right chest tube remain in place, unchanged. Right upper lobe and left lower lobe airspace opacities again noted, unchanged. No visible pneumothorax currently. Heart is borderline in size. No effusions. IMPRESSION: No visible pneumothorax. Stable patchy bilateral airspace opacities. Electronically Signed   By: Charlett Nose M.D.   On: 02/19/2020 08:16   DG CHEST PORT 1 VIEW  Result Date: 02/18/2020 CLINICAL DATA:  Pneumothorax, prior abnormal chest x-ray EXAM: PORTABLE CHEST 1 VIEW COMPARISON:  02/18/2020 FINDINGS: Single frontal view of the chest demonstrates stable endotracheal tube, enteric catheter, right internal jugular catheter, and right-sided chest tube. Further decrease in the right apical pneumothorax, far less than 5%. Consolidation within the right mid and left lower lung zones unchanged. Increased interstitial prominence throughout the lungs may reflect mild volume overload. No large effusion. No displaced fracture. IMPRESSION: 1. Trace residual right apical pneumothorax. Decreased since prior study. 2. Persistent right mid and left lower lung consolidation. 3. Increased interstitial prominence favor volume overload. Electronically Signed   By: Sharlet Salina M.D.   On: 02/18/2020 19:28   DG CHEST PORT 1 VIEW  Result Date: 02/18/2020 CLINICAL DATA:  Endotracheal tube repositioning EXAM: PORTABLE CHEST 1 VIEW COMPARISON:  02/18/2020 at 3:28 p.m. FINDINGS: Single frontal view of the chest  demonstrates repositioning of the endotracheal tube, tip projecting 2.2 cm above carina. Enteric catheter, right internal jugular catheter, and right-sided chest tube are unchanged. Consolidation within the right mid and left lower lung zones again noted and stable. Small left effusion is suspected. There is a trace right apical pneumothorax unchanged since prior exam. IMPRESSION: 1. Repositioning of endotracheal tube, now well above carina. 2. Otherwise stable support devices. 3. Stable trace right apical pneumothorax. 4. Stable bilateral airspace disease. Electronically Signed   By: Sharlet Salina M.D.   On: 02/18/2020 16:30   DG CHEST PORT 1 VIEW  Result Date: 02/18/2020 CLINICAL DATA:  [age] female status post chest tube placement. EXAM: PORTABLE CHEST 1 VIEW COMPARISON:  Earlier radiograph dated 02/18/2020 at 7:26 a.m. FINDINGS: There has been interval placement of a right-sided chest tube. Endotracheal tube with tip at the level of the carina. The tube can be pulled back by 2-3 cm for optimal positioning. Feeding tube extends below the diaphragm with tip beyond the inferior margin of the image. A small right pneumothorax is again noted similar or minimally decreased since the earlier radiograph. Slight improvement in aeration of the left lung. Bilateral airspace opacities are similar to prior radiograph. Stable cardiomediastinal silhouette. No acute osseous pathology. IMPRESSION: 1. Interval placement of a right-sided chest tube. 2. Small right pneumothorax similar or minimally decreased since the earlier radiograph. Electronically Signed   By: Elgie Collard M.D.   On: 02/18/2020 15:57     PHYSICAL EXAM    Temp:  [99.2 F (37.3 C)-101.5 F (38.6 C)] 100.3 F (37.9 C) (04/26 0800) Pulse Rate:  [66-84] 79 (04/26 0800) Resp:  [8-43] 32 (04/26 0800) BP: (114-166)/(50-68) 157/66 (04/26 0800) SpO2:  [95 %-100 %] 96 % (04/26 0800) FiO2 (%):  [40 %] 40 % (04/26 0324)  Middle-aged  Caucasian lady who is intubated and on ventilatory support.   . Afebrile. Head is nontraumatic. Neck is supple without bruit.    Cardiac exam no murmur or gallop. Lungs are clear to auscultation. Distal pulses are well felt.    Neurological Exam :  - intubated on fentanyl, eyes closed, not open on voice, not following  commands. With eyes forced opening, eyes in primary position with left exotropia, not blinking to visual threat, doll's eyes sluggish, not tracking, pupil equal but sluggish to light. Corneal reflex weak bilaterally, gag and cough reflex present. Breathing over the vent.  Facial symmetry not able to test due to ET tube.  Tongue protrusion not cooperative. On pain stimulation, left UE slight withdraw but no movement on other limbs. DTR 1+ and bilateral positive babinski. Sensation, coordination and gait not tested.   ASSESSMENT/PLAN [name] is a 64 y.o. female with history of Htn, Hld and estrogen therapy presenting with left gaze and right side flaccid. Vomited and was intubated  She did not receive IV t-PA due to ICH.  Stroke:  left MCA and bilateral ACA infarcts with large hematoma centered within the left frontal lobe likely due to hemorrhagic conversion s/p crani for hematoma evacuation - stroke etiology likely due to PAF  CT Head 01/28/2020 - Very large acute parenchymal hemorrhage centered within the left frontal lobe measuring 8.2 x 6.5 x 5.6 cm. 5 mm rightward midline shift at the level of the septum pellucidum.   CT head - 2020/02/13 - Unchanged size of massive intraparenchymal hematoma centered in the left frontal lobe.  MRI head - unchanged hematoma and surrounding edema. DWI signal at left MCA and ACA consistent with acute infarcts. Scattered punctate acute infarcts in both cerebral hemispheres suggesting emboli  CTA H&N - No large vessel occlusion or high-grade arterial stenosis. No evidence of vascular malformation on the current exam.  CT repeat 4/19 - significantly  decreased hematoma post evacuation  CT repeat 4/21 - stable residual hematoma and 4 mm of midline shift  CT Head 4/24 - Stable appearance status post evacuation of large left superior hemisphere intra-axial hematoma, aside from decreased pneumocephalus.  2D Echo - EF 65-70%   Sars Corona Virus 2 - negative - Ab positive  LDL - 74  HgbA1c - 5.6  UDS - negative  VTE prophylaxis - heparin subq  No antithrombotic prior to admission, now on No antithrombotic due to hemorrhage  Therapy recommendations:  Re-order once pt stable for evaluation  Disposition:  Pending  Daughter at bedside. Dr. Pearlean Brownie discussed current condition and prognosis, long-term needs.   Acute Respiratory Failure  Intubated for airway protection  On sedation   CCM on board  No extubation given worsening hypoxemia, infiltrated, increasing WBC (?PNA)  Bronch w/ ET repositioning, Cx  Family hope to avoid trach  Possible Aspiration Pneumonia Leukocytosis Fever  Pneumothorax, RIght  Nausea vomiting on presentation  Cefepime 4/16 >>4/22  CCM on board  Temp - 101.5  WBCs - 15.5  CXR 4/26 - No visible PNX. Stable patchy B airspace opacities  UA neg  Resp Cx 4/18 normal flora  Blood Cx 4/20 NGTD    Resp Cx 4/21 no growth  Resp Cx 4/23 normal  Meropenem 4/23>>  Vancomycin 4/23>>  Cerebral edema  5 mm Rightward Midline Shift on CT and MRI  NSG on board s/p hematoma evacuation 4/17   CT 4/21 stable residual hematoma and midline shift  Off 3%   23.4% saline x1  Na - 155  Na every 6h On free water 100cc Q4h   Atrial Fibrillation w/ RVR  New diagnosis  Given her embolic strokes on MRI, pt likely has PAF before admission  Now back to NSR  Amiodarone discontinued  On cardizem IV - transitioned to po cardizem  Cardiology consulted - appreciate help  May consider [facility]  once blood absorbed in the future (likely months before being able to add)  UE  shivering/shaking  Bilateral versus unilateral arm shaking/shivering  LTM EEG no seizure to correlate the shaking -> discontinued 4/19  Keppra - 500 mg Q 12 hrs after Keppra 2 g load  Seizure precautions  Hypertension  Home BP meds: Zestril 10 mg daily  off Cleviprex and normodyne (infusion)  On cardizem drip->po  Add lisinopril 10mg  bid -> off   Stable on the high end . SBP goal < 160 mm Hg  . Long-term BP goal normotensive  Hyperlipidemia  Home Lipid lowering medication: Pravachol 20 mg daily  LDL 74, goal < 70  Current lipid lowering medication: None for now  May resume statin at discharge vs. SATURN trial  Dysphagia At risk malnutrition   N.p.o.  On tube feeding at 45cc/hr  Speech following  Dietitian on board  Anemia, acute blood loss  Hb - 7.5  Likely due to post op  S/p PRBC in OR  Iron 16 and ferritin 66  Iron pills added  CBC monitoring  Other Stroke Risk Factors  Advanced age  Morbid Obesity, Body mass index is 42.53 kg/m., recommend weight loss, diet and exercise as appropriate   On estrogen daily po PTA  Other Active Problems  Code status - Limited Code  Hypokalemia - treated - K 3.5  Hypophosphatemia - treated - Phos 3.0   Family requested COVID Ab testing after COVID vaccine - serology reactive  Right eyelid swollen - d/c lisinopril -> Cardizem ordered by Dr [facility] day # 10 Patient's prognosis is quite poor's secondary to her large intracerebral hemorrhage and possible aspiration pneumonia and respiratory failure.  Long discussion with the daughter at bedside who feels patient would not have wanted prolonged ventilatory support and tracheostomy, PEG tube and nursing home care.  She is awaiting discussion with other family members about possible withdrawal of care if she does not get better in the next few days.  Continue ongoing care till then.  Discussed with critical care team. This patient is critically ill and  at significant risk of neurological worsening, death and care requires constant monitoring of vital signs, hemodynamics,respiratory and cardiac monitoring, extensive review of multiple databases, frequent neurological assessment, discussion with family, other specialists and medical decision making of high complexity.I have made any additions or clarifications directly to the above note.This critical care time does not reflect procedure time, or teaching time or supervisory time of PA/NP/Med Resident etc but could involve care discussion time.  I spent 30 minutes of neurocritical care time  in the care of  this patient.    Antony Contras, MD  To contact Stroke Continuity provider, please refer to [email or web address] After hours, contact General Neurology

## 2020-02-19 NOTE — Progress Notes (Signed)
NAME:  Meredith Cooper, MRN:  [id number], DOB:  10-May-1956, LOS: 10 ADMISSION DATE:  02/20/2020, CONSULTATION DATE:  02/03/2020 REFERRING MD:  Leonie Man - neuro, CHIEF COMPLAINT:  Code stroke - L gaze deviation, R sided weakness   Brief History   41 yowf never smoker  presented to [facility] ED 4/16 as code stroke with sudden onset R sided weakness, L gaze deviation. Rapid deterioration in ED; patient vomited multiple times in CT, then required intubation for airway protection. CTH with large acute parenchymal hemorrhage in L frontal lobe, 8.2 x 6.5 x 5.6 cm, 52m midline shift.   Past Medical History  HTN HLD Hiatal hernia GERD S/p hysterectomy   Significant Hospital Events   4/16 Code stroke, large ICH, Intubated.  4/17 overnight possible seizure activity, received ativan and started Keppra. Max on cleviprex, added PRN hydralazine for SBP goal <140 > pm craniotomy for hematoma evacuation  4/18 AFRVR 4/23 -  ET tube 9cm above carina. Increased R sided infiltrates. C/o mucus plugging. Afebrile but wbc up. Trach secretions +.  no growth so far. Fio2 up at 60%. Angioedema improved -> reintubted and s.p bronch 4/24 - cards changed to poi cardizem. Gets vent dysnchrony off fentanyl. STill febrile. Neuro feels prognosis bad. Family conference held with patient. Joined by her friend JJenny Reichmannon speaker phone. On mucomyst  Consults:  [place]  Stroke team   Procedures:  4/16 ETT >  4/16 CVC>> 4/16 Arterial line > 4/18  4/17 Craniotomy/ no drain  Significant Diagnostic Tests:  4/16 CTH > Large acute intraparenchymal hemorrhage centered in L frontal lobe 8.2 x 6.5 x 5.6 cm, 5107mmidline shift.  4/16 CTA H> no aneurysm  4/17 CT H> unchanged size of massive IPH centered in L frontal lobe. Interval increase in amount of blood in R lateral ventricle occipital horn. 63m68midline shift  4/17 MRI Brain >> 1. Unchanged large left frontoparietal parenchymal hematoma with unchanged rightward midline shift. Mild surrounding  edema and restricted diffusion consistent with associated acute infarct. 2. Small volume intraventricular and subarachnoid hemorrhage. 3. Scattered punctate acute infarcts in both cerebral hemispheres suggesting emboli. echo 4/17 with mod PAS elevation/ mild LVH 4/19 CT Head - s/p left frontal parietal craniotomy. No new hemorrhage. Improved midline shift, current 4mm28mtable IVH without hydrocephalus 4/19 EEG -cortical dysfunction in left hemisphere likely secondary to underlying hemorrhage. Additionally, there is evidence of severe diffuse encephalopathy, non specific to etiology.No seizures ordefiniteepileptiform discharges were seen throughout the recording.  Micro Data:  4/16 SARS Cov2   neg  4/16  MRSA PCR  neg  4/18 Respiratory culture-rare GPC's, normal flora  4/.20 blood - neg so far 4/.23  - bronch aspiration - neg so far 4/24 BAL - neg so far  Antimicrobials:  4/16 cefepime > 4/22 4/17 Vanc x1 for OR 4/23 - vanc > 4/25  4/23 - merrem  Interim history/subjective:  No events overnight. Continues on vent with no neuro improvement   Objective   Blood pressure (!) 148/60, pulse 83, temperature 100.3 F (37.9 C), temperature source Axillary, resp. rate (!) 36, height _0  (1.626 m), weight 112.4 kg, SpO2 97 %.    Vent Mode: PRVC FiO2 (%):  [40 %] 40 % Set Rate:  [20 bmp] 20 bmp Vt Set:  [440 mL] 440 mL PEEP:  [5 cmH20] 5 cmH20 Plateau Pressure:  [14 cmH20-20 cmH20] 14 cmH20   Intake/Output Summary (Last 24 hours) at 02/19/2020 1147 Last data filed at 02/19/2020 1000 Gross per  24 hour  Intake 1679.45 ml  Output 2500 ml  Net -820.55 ml   Filed Weights   02/15/20 0500 02/16/20 0500 02/17/20 2200  Weight: 112.6 kg 112.4 kg 112.4 kg   General Appearance:  Adult female on vent  HEENT: ETT/OG in place, Dry MM  Lungs: Clear breath sounds, no crackles/wheeze Heart:  RRR, no MRG Abdomen:  Soft, non-distended, active bowel sounds  Genitalia / Rectal:  Not  done Extremities:  +1 generalized edema  Skin: intact  Neurologic: +cough/gag, does not open eyes to physical/verbal stimulation, withdrawals from pain, does not follow commands  Resolved Hospital Problem list    Possible angioedema 02/15/20  - hard to differentiate between anasarca  - dc home lisinopril being given and stay off  Assessment & Plan:   Acute hypoxic respiratory failure with failure to protect airway, concern for aspiration PNA -intubated in ED with failed airway protection after vomiting - s/p reintubation 02/16/20 Plan - Continue vent support >> Neuro status barrier for extubation  - VAP bundle, pulm hygiene  - PRN CXR  - PRN Fentanyl/Versed   Presumed Aspiration PNA  -Bilateral Patchy Airspace Opacities  Plan -Trend WBC and Fever Curve -Follow for Culture Results  -Continue Merrem (Day 4), Vancomycin Stopped 4/25   Large ICH with cytotoxic cerebral edema  Possible seizure activity s/p Crani  -s/p craniotomy pm 4/17 s drain -S/p 3% saline ended 02/15/20 Plan  -Per Neurology/Neurosugery  -SBP goal < 160 -BID keppra   Volume overload  -3% saline ending 4/22 Hypokalemia  Plan -Trend BMP -Replace electrolytes as indicated  -Give 1 dose Lasix now with 40 meq K   Atrial Fibrillation with RVRL: possibly underlying PAF vs new Afib in setting of acute critical illness  HTN -echo 4/17 with mod PAS elevation/ mild LVH Plan -Cardiac Monitoring  -Continue Cardizem   Hyperglycemia Plan -SSI  Acute blood loss anemia  -in setting of craniotomy 4/17  Plan -Trend CBC -Hemoglobin stable at 7.5   Best practice:  Diet: EN per RDN  Pain/Anxiety/Delirium protocol (if indicated): RASS goal 0, PRN fentanyl PRN versed VAP protocol (if indicated): yes DVT prophylaxis: SQ heparin  GI prophylaxis: protonix  Glucose control: SSI Mobility: BR Code Status: Full  Family Communication:    Extensive family meeting today 4/26. Plans to continue current medical care  until Thursday. If no improvement will transition to comfort care. No Trach/Peg   Disposition: ICU   Hayden Pedro, AGACNP-BC [facility]  Pgr: [phone]  PCCM Pgr: [phone]

## 2020-02-19 NOTE — Op Note (Signed)
  NEUROSURGERY OPERATIVE NOTE   PREOP DIAGNOSIS:  1. Left fronto-parietal hemorrhage 2. Intracranial hypertension   POSTOP DIAGNOSIS: Same  PROCEDURE: 1. Left frontoparietal craniotomy for evacuation of hematoma  SURGEON: Dr. Lisbeth Renshaw, MD  ASSISTANT: Cindra Presume, PA-C  ANESTHESIA: General Endotracheal  EBL: 100cc  SPECIMENS: None  DRAINS: None  COMPLICATIONS: None immediate  CONDITION: Hemodynamically stable to ICU  HISTORY: Meredith Cooper is a 64 y.o. female initially presented to the hospital lethargic with right hemiplegia.  Work-up included CT scan demonstrating a large left-sided frontoparietal intraparenchymal hematoma with significant amount of local mass-effect.  The hematoma was noted to come to the surface of the cortex.  Neurosurgery was consulted for the possibility of evacuation of the hematoma.  I had a lengthy discussion with the patient's daughter as well as the primary neurology service.  Given the relatively easy access to the hematoma and somewhat low risk of immediate perioperative morbidity, we did elect to proceed with hematoma evacuation for relief of mass-effect.  The risks, benefits, and alternatives to surgery as well as the expected postoperative course were all discussed in detail.  After all her questions were answered informed consent was obtained and witnessed.  PROCEDURE IN DETAIL: The patient was brought to the operating room. After induction of general anesthesia, the patient was positioned on the operative table in the supine position. All pressure points were meticulously padded. Skin incision was then marked out and prepped and draped in the usual sterile fashion.  After timeout was conducted, a sigmoid shaped skin incision was marked out on the left frontotemporal region.  Skin incision was then infiltrated with local anesthetic with epinephrine.  Incision was then made sharply and carried down through the galea.  Raney clips were  applied.  Self-retaining retractors were then placed.  Bur holes were then created and connected with the craniotome, and a single piece frontoparietal craniotomy flap was elevated.  Hemostasis on the epidural plane was secured with bipolar electrocautery and morselized Gelfoam with thrombin.  Cruciate dural incision was then made.  The underlying cortex was noted to be essentially purple in color indicating underlying hematoma.  The cortex was therefore coagulated with bipolar electrocautery and incised.  Both liquid and solid hematoma was immediately encountered.  Using a combination of suction as well as blunt dissectors, hematoma was progressively evacuated.  Once white matter was encountered both anteriorly, medially, posteriorly, and in the deep plane, I did note significant relaxation of the brain.  Hemostasis within the hematoma cavity was achieved using a combination of bipolar electrocautery and morselized Gelfoam with thrombin.  The wound was then irrigated with copious amounts of normal saline irrigation.  The dura was then reapproximated with interrupted 4-0 Nurolon stitches.  A layer of Gelfoam was then placed over the dural surface.  The bone flap was then replaced and plated with standard titanium plates and screws.  The galea was then reapproximated with interrupted 3-0 Vicryl stitches and the skin was closed with staples.  Bacitracin ointment and sterile dressing was applied.  At the end of the case all sponge, needle, and instrument counts were correct. The patient was then transferred to the stretcher and taken to the intensive care unit in stable hemodynamic condition.

## 2020-02-19 NOTE — Progress Notes (Signed)
Pharmacy Antibiotic Note  Meredith Cooper is a 64 y.o. female admitted on 02/20/2020 with code stroke with sudden onset R sided weakness, L gaze deviation. Pharmacy consulted for meropenem dosing. Found thick grey-red aspirate in bronch, completed a course of cefepime. Respiratory culture found normal flora. Blood cultures no growth. WBC 15.5. Tmax 101.5.   Patient has been receiving meropenem 1g IV q8h and today is day 4 of meropenem.   Plan: Continue meropenem 1g IV q8h Monitor renal function, clinical progression, and length of therapy  Height: 5\' 4"  (162.6 cm) Weight: 112.4 kg (247 lb 12.8 oz) IBW/kg (Calculated) : 54.7  Temp (24hrs), Avg:100.5 F (38.1 C), Min:99.2 F (37.3 C), Max:101.5 F (38.6 C)  Recent Labs  Lab 02/14/20 0409 02/14/20 1031 02/15/20 0516 02/16/20 0533 02/16/20 2349 02/17/20 0623 02/18/20 0507 02/19/20 0110 02/19/20 0415  WBC   < >  --  12.4* 16.4*  --  16.1* 14.9* 15.5*  --   CREATININE   < >  --  0.70 0.71  --  0.77 0.77  --  0.73  LATICACIDVEN  --  1.8  --   --  1.1  --   --   --   --    < > = values in this interval not displayed.    Estimated Creatinine Clearance: 85 mL/min (by C-G formula based on SCr of 0.73 mg/dL).    Allergies  Allergen Reactions  . Penicillins Itching and Rash    Antimicrobials this admission: Meropenem 4/23 >> Vancomycin 4/23 >>4/25 Cefepime 4/16 >> 4/22  Dose adjustments this admission: N/A  Microbiology results: 4/20 BCx: ngf 4/23 Sputum: normal flora 4/16 MRSA PCR: Negative.   Thank you for allowing pharmacy to be a part of this patient's care.  5/16, PharmD PGY1 Pharmacy Resident [employer]: [phone]  02/19/2020 11:30 AM

## 2020-02-19 NOTE — Consult Note (Signed)
Consultation Note Date: 02/19/2020   Patient Name: Meredith Cooper  DOB: [birth year]  MRN: [id number]  Age / Sex: 64 y.o., female  PCP: Wayland Denis, PA-C Referring Physician: Garvin Fila, MD  Reason for Consultation: Establishing goals of care  HPI/Patient Profile: 64 y.o. female  with past medical history of thyroid disease, hypertension, hyperlipidemia, hiatal hernia, GERD admitted on 01/29/2020 with altered mental status, flaccid right side, and left gaze. Found to have large left frontal hemorrhage, left MCA and bilateral ACA infarcts with large hematoma centered within left frontal lobe likely hemorrhagic conversion s/p decompressive craniectomy. PCCM and neurology following. Poor neurological recovery and poor prognosis. Palliative medicine consultation for goals of care.   Clinical Assessment and Goals of Care:  PMT consult received and chart reviewed in detail. Discussed with RN.   Met with patient's husband, Joe at bedside. Introduced role of palliative medicine.   Joe shares a brief life review of Halee. They have been married for 50 years! (She was only 78 when married). They have one daughter, Kenney Houseman. Everette retired from [employer]. Prior to admission, independent but fairly sedentary per husband.   Discussed events leading up to admission and course of hospitalization including diagnoses, interventions, plan of care. Joe and [place] met with providers this afternoon and understand diagnoses and unfortunate poor prognosis for meaningful recovery. Joe speaks of plan to 'wait and pray' until Thursday. The patient has 10+ siblings and per Wille Glaser, they wish to give the family more time to process this unexpected event.   Joe shares that his wife has previously spoken of her wishes against life-prolonging interventions in an event such as this. 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 His wife would not find that quality of life acceptable. Joe nods his head no when trach/peg mentioned, again speaking of plan to give her until Thursday before withdrawing care.   Briefly explained compassionate extubation and visitor policy with shift to comfort.    Reassured of ongoing support from PMT provider this week. Joe shares that daughter Kenney Houseman will likely visit [facility] in AM and would appreciate a visit. PMT provider plans to check in with daughter in AM. PMT contact information given.    SUMMARY OF RECOMMENDATIONS    Continue current plan of care and medical management. After discussions with neurology and PCCM, family wishes to continue current plan until Thursday. Extended family needs more time to process. Leaning against trach/peg. Husband shares that the patient has previously spoken of her wishes against life-prolonging interventions if poor quality of life.   PMT will continue to follow daily and support family. Likely compassionate extubation to comfort by the end of this week.   Code Status/Advance Care Planning:  Limited code  Symptom Management:   Per attending  Palliative Prophylaxis:   Aspiration, Oral Care and Turn Reposition  Psycho-social/Spiritual:   Desire for further Chaplaincy support: yes  Additional Recommendations: Caregiving  Support/Resources and Compassionate Wean Education  Prognosis:   Poor prognosis  Discharge Planning: To Be Determined      Primary Diagnoses: Present on Admission: . Stroke ([place]) . Acute respiratory failure with hypoxemia ([place]) . Acute blood loss anemia . Essential hypertension   I have reviewed the medical record, interviewed the patient and family, and examined the patient. The following aspects are pertinent.  Past Medical History:  Diagnosis Date  . AR (allergic rhinitis)   . GERD (gastroesophageal reflux disease)   . Hiatal hernia   . Hyperlipemia     . Hypertension   . Thyroid disease   . Vaginal prolapse    Social History   Socioeconomic History  . Marital status: Married    Spouse name: Not on file  . Number of children: Not on file  . Years of education: Not on file  . Highest education level: Not on file  Occupational History  . Not on file  Tobacco Use  . Smoking status: Never Smoker  . Smokeless tobacco: Never Used  Substance and Sexual Activity  . Alcohol use: No  . Drug use: No  . Sexual activity: Not Currently    Birth control/protection: Surgical  Other Topics Concern  . Not on file  Social History Narrative  . Not on file   Social Determinants of Health   Financial Resource Strain:   . Difficulty of Paying Living Expenses:   Food Insecurity:   . Worried About Charity fundraiser in the Last Year:   . Arboriculturist in the Last Year:   Transportation Needs:   . Film/video editor (Medical):   Marland Kitchen Lack of Transportation (Non-Medical):   Physical Activity:   . Days of Exercise per Week:   . Minutes of Exercise per Session:   Stress:   . Feeling of Stress :   Social Connections:   . Frequency of Communication with Friends and Family:   . Frequency of Social Gatherings with Friends and Family:   . Attends Religious Services:   . Active Member of Clubs or Organizations:   . Attends Archivist Meetings:   Marland Kitchen Marital Status:    Family History  Problem Relation Age of Onset  . Bone cancer Mother   . Bladder Cancer Sister   . Colon cancer Sister   . Atrial fibrillation Sister   . Diabetes Brother   . Ovarian cancer Maternal Grandmother   . Heart disease Father        had enlarged heart, died in his 34s  . Breast cancer Neg Hx    Scheduled Meds: . chlorhexidine gluconate (MEDLINE KIT)  15 mL Mouth Rinse BID  . Chlorhexidine Gluconate Cloth  6 each Topical Daily  . diltiazem  30 mg Per Tube Q6H  . docusate  100 mg Per Tube BID  . feeding supplement (PRO-STAT SUGAR FREE 64)  30 mL Per  Tube TID  . ferrous sulfate  300 mg Per Tube BID WC  . free water  100 mL Per Tube Q4H  . heparin injection (subcutaneous)  5,000 Units Subcutaneous Q8H  . insulin aspart  0-9 Units Subcutaneous Q4H  . levETIRAcetam  500 mg Per Tube BID  . levothyroxine  112 mcg Per Tube Q0600  . mouth rinse  15 mL Mouth Rinse 10 times per day  . pantoprazole sodium  40 mg Per Tube Daily  . senna-docusate  1 tablet Per Tube BID  . sodium chloride flush  10-40 mL Intracatheter Q12H  Continuous Infusions: . sodium chloride Stopped (02/16/20 0844)  . feeding supplement (VITAL HIGH PROTEIN) 45 mL/hr at 02/19/20 0700  . meropenem (MERREM) IV Stopped (02/19/20 0959)  . propofol (DIPRIVAN) infusion 30 mcg/kg/min (02/19/20 1400)   PRN Meds:.acetaminophen **OR** acetaminophen (TYLENOL) oral liquid 160 mg/5 mL **OR** acetaminophen, albuterol, fentaNYL (SUBLIMAZE) injection, hydrALAZINE, labetalol, midazolam, sodium chloride flush Medications Prior to Admission:  Prior to Admission medications   Medication Sig Start Date End Date Taking? Authorizing Provider  estradiol (ESTRACE) 1 MG tablet Take 1 tablet (1 mg total) by mouth daily. 12/01/19  Yes Rubie Maid, MD  lisinopril (PRINIVIL,ZESTRIL) 10 MG tablet Take 10 mg by mouth daily.  12/31/17  Yes [provider]  meloxicam (MOBIC) 15 MG tablet Take 15 mg by mouth daily.   Yes [provider]  montelukast (SINGULAIR) 10 MG tablet Take 10 mg by mouth at bedtime.   Yes [provider]  omeprazole (PRILOSEC) 20 MG capsule Take 20 mg by mouth daily.   Yes [provider]  pravastatin (PRAVACHOL) 20 MG tablet Take 20 mg by mouth daily.   Yes [provider]  zolpidem (AMBIEN) 10 MG tablet Take 10 mg by mouth at bedtime as needed for sleep.   Yes [provider]  azelastine (ASTELIN) 0.1 % nasal spray Place 1 spray into both nostrils 2 (two) times daily.     [provider]  Bacillus Coagulans-Inulin  (PROBIOTIC) 1-250 BILLION-MG CAPS Take 1 capsule by mouth daily.     [provider]  Cetirizine HCl 10 MG CAPS Take 1 capsule by mouth daily.     [provider]  conjugated estrogens (PREMARIN) vaginal cream Insert 1 g intravaginal weekly Patient not taking: Reported on 12/20/2019 12/17/18   Rubie Maid, MD  fluticasone [facility]) 50 MCG/ACT nasal spray Place 2 sprays into both nostrils daily.     [provider]  levocetirizine (XYZAL) 5 MG tablet Take 5 mg by mouth every evening.    [provider]  levothyroxine (SYNTHROID) 112 MCG tablet Take 112 mcg by mouth daily. 12/26/19   [provider]  Multiple Vitamins-Minerals (EQ VISION FORMULA 50+ PO) Take 1 tablet by mouth daily.     [provider]  omega-3 fish oil (MAXEPA) 1000 MG CAPS capsule Take 1 capsule by mouth daily.     [provider]  OXYQUINOLONE SULFATE VAGINAL (TRIMO-SAN) 0.025 % GEL Place 1 g vaginally once a week. Patient not taking: Reported on 12/20/2019 05/19/18   Defrancesco, Alanda Slim, MD  Resveratrol 100 MG CAPS Take 100 mg by mouth daily.     [provider]  TRIMO-SAN 0.025-0.01 % GEL Apply 1 application topically daily.  05/20/18   [provider]   Allergies  Allergen Reactions  . Penicillins Itching and Rash   Review of Systems  Unable to perform ROS: Acuity of condition   Physical Exam Vitals and nursing note reviewed.  Constitutional:      Interventions: She is sedated and intubated.  Cardiovascular:     Rate and Rhythm: Normal rate.  Pulmonary:     Effort: No tachypnea, accessory muscle usage or respiratory distress. She is intubated.     Breath sounds: Normal breath sounds.  Skin:    General: Skin is warm and dry.  Neurological:     Comments: Does not track, not following commands. Responds to pain. Cough/gag present.    Vital Signs: BP (!) 153/67   Pulse 80   Temp 100.2 F (37.9  C) (Axillary)   Resp (!) 35   Ht _0   (1.626 m)   Wt 112.4 kg   SpO2 98%   BMI 42.53 kg/m  Pain Scale: CPOT   Pain Score: 0-No pain(unable to assess pt unresponsive)   SpO2: SpO2: 98 % O2 Device:SpO2: 98 % O2 Flow Rate: .   IO: Intake/output summary:   Intake/Output Summary (Last 24 hours) at 02/19/2020 1555 Last data filed at 02/19/2020 1400 Gross per 24 hour  Intake 1581.1 ml  Output 1250 ml  Net 331.1 ml    LBM: Last BM Date: 02/19/20 Baseline Weight: Weight: 109.3 kg Most recent weight: Weight: 112.4 kg     Palliative Assessment/Data: PPS 10%     Time In: 1445 Time Out: 1545 Time Total: 74mn Greater than 50%  of this time was spent counseling and coordinating care related to the above assessment and plan.  Signed by:  MIhor Dow DNP, FNP-C Palliative Medicine Team  Phone: [phone]: [phone]  Please contact Palliative Medicine Team phone at [phone] questions and concerns.  For individual provider: See AShea Evans

## 2020-02-19 NOTE — Progress Notes (Signed)
Labs drawn again and resent @0110h .

## 2020-02-20 DIAGNOSIS — I6389 Other cerebral infarction: Secondary | ICD-10-CM | POA: Diagnosis not present

## 2020-02-20 DIAGNOSIS — Z978 Presence of other specified devices: Secondary | ICD-10-CM | POA: Diagnosis not present

## 2020-02-20 DIAGNOSIS — Z515 Encounter for palliative care: Secondary | ICD-10-CM | POA: Diagnosis not present

## 2020-02-20 DIAGNOSIS — J9601 Acute respiratory failure with hypoxia: Secondary | ICD-10-CM | POA: Diagnosis not present

## 2020-02-20 DIAGNOSIS — I619 Nontraumatic intracerebral hemorrhage, unspecified: Secondary | ICD-10-CM | POA: Diagnosis not present

## 2020-02-20 DIAGNOSIS — I480 Paroxysmal atrial fibrillation: Secondary | ICD-10-CM | POA: Diagnosis not present

## 2020-02-20 LAB — CBC
HCT: 26 % — ABNORMAL LOW (ref 36.0–46.0)
Hemoglobin: 7.5 g/dL — ABNORMAL LOW (ref 12.0–15.0)
MCH: 24.7 pg — ABNORMAL LOW (ref 26.0–34.0)
MCHC: 28.8 g/dL — ABNORMAL LOW (ref 30.0–36.0)
MCV: 85.5 fL (ref 80.0–100.0)
Platelets: 331 10*3/uL (ref 150–400)
RBC: 3.04 MIL/uL — ABNORMAL LOW (ref 3.87–5.11)
RDW: 15.9 % — ABNORMAL HIGH (ref 11.5–15.5)
WBC: 15.9 10*3/uL — ABNORMAL HIGH (ref 4.0–10.5)
nRBC: 0.3 % — ABNORMAL HIGH (ref 0.0–0.2)

## 2020-02-20 LAB — BASIC METABOLIC PANEL WITH GFR
Anion gap: 7 (ref 5–15)
BUN: 33 mg/dL — ABNORMAL HIGH (ref 8–23)
CO2: 28 mmol/L (ref 22–32)
Calcium: 8 mg/dL — ABNORMAL LOW (ref 8.9–10.3)
Chloride: 115 mmol/L — ABNORMAL HIGH (ref 98–111)
Creatinine, Ser: 0.69 mg/dL (ref 0.44–1.00)
GFR calc Af Amer: >60 mL/min
GFR calc non Af Amer: >60 mL/min
Glucose, Bld: 144 mg/dL — ABNORMAL HIGH (ref 70–99)
Potassium: 3.5 mmol/L (ref 3.5–5.1)
Sodium: 150 mmol/L — ABNORMAL HIGH (ref 135–145)

## 2020-02-20 LAB — GLUCOSE, CAPILLARY
Glucose-Capillary: 100 mg/dL — ABNORMAL HIGH (ref 70–99)
Glucose-Capillary: 105 mg/dL — ABNORMAL HIGH (ref 70–99)
Glucose-Capillary: 115 mg/dL — ABNORMAL HIGH (ref 70–99)
Glucose-Capillary: 123 mg/dL — ABNORMAL HIGH (ref 70–99)
Glucose-Capillary: 129 mg/dL — ABNORMAL HIGH (ref 70–99)
Glucose-Capillary: 129 mg/dL — ABNORMAL HIGH (ref 70–99)
Glucose-Capillary: 132 mg/dL — ABNORMAL HIGH (ref 70–99)

## 2020-02-20 LAB — MAGNESIUM: Magnesium: 2.3 mg/dL (ref 1.7–2.4)

## 2020-02-20 LAB — SODIUM: Sodium: 148 mmol/L — ABNORMAL HIGH (ref 135–145)

## 2020-02-20 LAB — PHOSPHORUS: Phosphorus: 3.3 mg/dL (ref 2.5–4.6)

## 2020-02-20 MED ORDER — POTASSIUM CHLORIDE 20 MEQ/15ML (10%) PO SOLN
40.0000 meq | Freq: Once | ORAL | Status: AC
  Administered 2020-02-20: 40 meq
  Filled 2020-02-20: qty 30

## 2020-02-20 MED ORDER — DEXMEDETOMIDINE HCL IN NACL 400 MCG/100ML IV SOLN
0.4000 ug/kg/h | INTRAVENOUS | Status: DC
  Administered 2020-02-20 (×2): 0.4 ug/kg/h via INTRAVENOUS
  Filled 2020-02-20: qty 100

## 2020-02-20 MED ORDER — FUROSEMIDE 10 MG/ML IJ SOLN
40.0000 mg | Freq: Three times a day (TID) | INTRAMUSCULAR | Status: AC
  Administered 2020-02-20 (×2): 40 mg via INTRAVENOUS
  Filled 2020-02-20 (×2): qty 4

## 2020-02-20 NOTE — Progress Notes (Signed)
Nutrition Follow-up   DOCUMENTATION CODES:   Morbid obesity  INTERVENTION:   Vital High Protein @ 45 ml/hr via Cortrak tube 30 ml Prostat TID  100 ml free water every 4 hours Provides: 1380 kcal, 139 grams protein, and 902 ml free water.  Total free water: 1502 ml   NUTRITION DIAGNOSIS:   Inadequate oral intake related to inability to eat as evidenced by NPO status. Ongoing.   GOAL:   Patient will meet greater than or equal to 90% of their needs Meeting with TF  MONITOR:   Vent status, Labs, Weight trends, TF tolerance, I & O's  REASON FOR ASSESSMENT:   Ventilator, Consult Enteral/tube feeding initiation and management  ASSESSMENT:   64 year old female with PMHx of GERD, hiatal hernia, HTN, HLD admitted with L ICH with midline shift, intubated for airway protection, also with possible seizures.   Pt discussed during ICU rounds and with RN.  Per MD no plans for trach/PEG with possible terminal extubation on Thursday 4/29  4/17 s/p craniotomy for evacuation 4/21 cortrak, tip gastric  Patient is currently intubated on ventilator support MV: 14.1 L/min Temp (24hrs), Avg:100.4 F (38 C), Min:99.7 F (37.6 C), Max:102.4 F (39.1 C)  Medications reviewed and include: Colace 100 mg BID, ferrous sulfate, lasix through today, Novolog 0-9 units Q4hrs, senokot-s Amiodarone   Labs reviewed: CBG 123-100-115 Sodium 150 CT to water seal  Enteral Access: Cortrak; tip gastric  Diet Order:   Diet Order            Diet NPO time specified  Diet effective now             EDUCATION NEEDS:   No education needs have been identified at this time  Skin:  Skin Assessment: Reviewed RN Assessment  Last BM:  4/26 x 2  Height:   Ht Readings from Last 1 Encounters:  02/17/20 5\' 4"  (1.626 m)   Weight:   Wt Readings from Last 1 Encounters:  02/17/20 112.4 kg   Ideal Body Weight:  54.5 kg  BMI:  Body mass index is 42.53 kg/m.  Estimated Nutritional Needs:    Kcal:  02/19/20  Protein:  136 grams  Fluid:  1.6-1.9 L/day  [id number]., RD, LDN, CNSC See AMiON for contact information

## 2020-02-20 NOTE — Progress Notes (Signed)
Daily Progress Note   Patient Name: Meredith Cooper       Date: 02/20/2020 DOB: July 29, 1956  Age: 64 y.o. MRN#: [id number] Attending Physician: Meredith Fila, MD Primary Care Physician: Meredith Denis, PA-C Admit Date: 01/31/2020  Reason for Consultation/Follow-up: Establishing goals of care  Subjective: Eyes open spontaneously but not following commands. Does not track. Remains intubated. Appears comfortable.  GOC:  PMT provider met with daughter, Meredith Cooper in family conference room. Introduced role of palliative medicine. Updated on my conversation with her father yesterday.    Discussed a brief life review of patient. Meredith Cooper is only child.   Discussed in detail events leading up to admission and course of hospitalization including diagnoses, interventions, plan of care and unfortunate poor prognosis following devastating stroke/bleed. Discussed recommendations from neurology and critical care team. Discussed continued aggressive medical management versus comfort focused pathway including compassionate one-way extubation and comfort measures/visitor policy.  Meredith Cooper shares that her mother has a documented living will and has spoke of her wishes against life-prolonging interventions. Meredith Cooper and her father, Meredith Glaser do not believe Meredith Cooper would want trach/peg and leaning towards compassionate extubation but wish to give her at least until Thursday before making decision. "Praying for a miracle." Today is the patient's husband's birthday. Meredith Cooper also struggles with comments from family members feeling that Meredith Cooper and Meredith Cooper are giving up on Meredith Cooper and need to continue to "fight." Meredith Cooper does make many comments about her feelings with losing her best friend and also hoping and believing her mother will have peace when she  passes (many hardships throughout life).   Therapeutic listening and emotional/spiritual support provided. Reassured of ongoing support from palliative. PMT contact information given to [facility].   Length of Stay: 11  Current Medications: Scheduled Meds:  . chlorhexidine gluconate (MEDLINE KIT)  15 mL Mouth Rinse BID  . Chlorhexidine Gluconate Cloth  6 each Topical Daily  . diltiazem  30 mg Per Tube Q6H  . docusate  100 mg Per Tube BID  . feeding supplement (PRO-STAT SUGAR FREE 64)  30 mL Per Tube TID  . ferrous sulfate  300 mg Per Tube BID WC  . free water  100 mL Per Tube Q4H  . furosemide  40 mg Intravenous Q8H  . heparin injection (subcutaneous)  5,000 Units Subcutaneous Q8H  . insulin aspart  0-9 Units Subcutaneous Q4H  . levETIRAcetam  500 mg Per Tube BID  . levothyroxine  112 mcg Per Tube Q0600  . mouth rinse  15 mL Mouth Rinse 10 times per day  . pantoprazole sodium  40 mg Per Tube Daily  . potassium chloride  40 mEq Per Tube Once  . senna-docusate  1 tablet Per Tube BID  . sodium chloride flush  10-40 mL Intracatheter Q12H    Continuous Infusions: . sodium chloride Stopped (02/16/20 0844)  . dexmedetomidine (PRECEDEX) IV infusion 0.4 mcg/kg/hr (02/20/20 0915)  . feeding supplement (VITAL HIGH PROTEIN) 45 mL/hr at 02/19/20 0700  . meropenem (MERREM) IV Stopped (02/20/20 0425)    PRN Meds: acetaminophen **OR** acetaminophen (TYLENOL) oral liquid 160 mg/5 mL **OR** acetaminophen, albuterol, fentaNYL (SUBLIMAZE) injection, hydrALAZINE, labetalol, midazolam, sodium chloride flush  Physical Exam Vitals and nursing note reviewed.  Constitutional:      Interventions: She is sedated and intubated.  HENT:     Head: Normocephalic and atraumatic.  Pulmonary:     Effort: No tachypnea, accessory muscle usage or respiratory distress. She is intubated.     Breath sounds: Normal breath sounds.  Skin:    General: Skin is warm and dry.  Neurological:     Comments: Does not follow  commands. Does not track. Responds to painful stimuli. Intermittently opens eyes but with non-purposeful response. + gag/cough            Vital Signs: BP (!) 166/67   Pulse 83   Temp 100.3 F (37.9 C) (Axillary)   Resp (!) 34   Ht '5\' 4"'  (1.626 m)   Wt 112.4 kg   SpO2 100%   BMI 42.53 kg/m  SpO2: SpO2: 100 % O2 Device: O2 Device: Ventilator O2 Flow Rate:    Intake/output summary:   Intake/Output Summary (Last 24 hours) at 02/20/2020 1134 Last data filed at 02/20/2020 0800 Gross per 24 hour  Intake 763.85 ml  Output 1530 ml  Net -766.15 ml   LBM: Last BM Date: 02/19/20 Baseline Weight: Weight: 109.3 kg Most recent weight: Weight: 112.4 kg       Palliative Assessment/Data: PPS 10%      Patient Active Problem List   Diagnosis Date Noted  . Hemorrhagic stroke ([place])   . Endotracheally intubated   . Palliative care by specialist   . VAP (ventilator-associated pneumonia) ([place])   . Acute respiratory failure with hypoxemia ([place]) 02/11/2020  . Acute blood loss anemia 02/11/2020  . Stroke ([place]) 02/11/2020  . Obesity (BMI 30-39.9) 12/18/2018  . Post-menopause on HRT (hormone replacement therapy) 12/18/2018  . Essential hypertension 12/18/2018  . Hyperlipidemia 12/18/2018  . Incomplete bladder emptying 12/18/2018  . 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 Rectocele 12/10/2017  . Cystocele, midline 12/10/2017  . Status post hysterectomy 12/09/2016  . Goals of care, counseling/discussion 04/22/2016  . SUI (stress urinary incontinence, female) 04/22/2016    Palliative Care Assessment & Plan   Patient Profile: 64 y.o. female  with past medical history of thyroid disease, hypertension, hyperlipidemia, hiatal hernia, GERD admitted on 01/31/2020 with altered mental status, flaccid right side, and left gaze. Found to have large left frontal hemorrhage, left MCA and bilateral ACA infarcts with large hematoma centered within left frontal lobe likely hemorrhagic conversion s/p decompressive  craniectomy. PCCM and neurology following. Poor neurological recovery and poor prognosis. Palliative medicine consultation for goals of care.   Assessment: Large ICH  Acute hypoxic respiratory failure Presumed aspiration pneumonia Spontaneous small right apical pneumothorax Afib with  RVR  Recommendations/Plan:  Continue current plan of care and medical management. After discussions with neurology and PCCM, family wishes to continue current plan until Thursday. Extended family needs more time to process. Leaning against trach/peg. Husband shares that the patient has previously spoken of her wishes against life-prolonging interventions if poor quality of life.   PMT will continue to follow daily and support family. Likely compassionate extubation to comfort by the end of this week. Discussed in detail with daughter today.   Code Status: Limited   Code Status Orders  (From admission, onward)         Start     Ordered   02/17/20 1143  Limited resuscitation (code)  Continuous    Comments: Pressors ok  Question Answer Comment  In the event of cardiac or respiratory ARREST: Initiate Code Blue, Call Rapid Response Yes   In the event of cardiac or respiratory ARREST: Perform CPR No   In the event of cardiac or respiratory ARREST: Perform Intubation/Mechanical Ventilation Yes   In the event of cardiac or respiratory ARREST: Use NIPPV/BiPAp only if indicated No   In the event of cardiac or respiratory ARREST: Administer ACLS medications if indicated No   In the event of cardiac or respiratory ARREST: Perform Defibrillation or Cardioversion if indicated No      02/17/20 1200        Code Status History    Date Active Date Inactive Code Status Order ID Comments User Context   02/20/2020 1529 02/17/2020 1200 Full Code [id number]  Vonzella Nipple, NP ED   Advance Care Planning Activity       Prognosis:   Poor prognosis  Discharge Planning:  To Be Determined  Care plan was  discussed with RN, daughter Meredith Cooper)  Thank you for allowing the Palliative Medicine Team to assist in the care of this patient.   Time In: 1000 Time Out: 1115 Total Time 75 Prolonged Time Billed  yes      Greater than 50%  of this time was spent counseling and coordinating care related to the above assessment and plan.  Ihor Dow, DNP, FNP-C Palliative Medicine Team  Phone: [phone] Fax: [phone]  Please contact Palliative Medicine Team phone at [phone] for questions and concerns.

## 2020-02-20 NOTE — Progress Notes (Addendum)
NAME:  Meredith Cooper, MRN:  [id number], DOB:  05-18-1956, LOS: 11 ADMISSION DATE:  02/12/2020, CONSULTATION DATE:  01/27/2020 REFERRING MD:  Leonie Man - neuro, CHIEF COMPLAINT:  Code stroke - L gaze deviation, R sided weakness   Brief History   61 yowf never smoker  presented to [facility] ED 4/16 as code stroke with sudden onset R sided weakness, L gaze deviation. Rapid deterioration in ED; patient vomited multiple times in CT, then required intubation for airway protection. CTH with large acute parenchymal hemorrhage in L frontal lobe, 8.2 x 6.5 x 5.6 cm, 74m midline shift.   Past Medical History  HTN HLD Hiatal hernia GERD S/p hysterectomy   Significant Hospital Events   4/16 Code stroke, large ICH, Intubated.  4/17 overnight possible seizure activity, received ativan and started Keppra. Max on cleviprex, added PRN hydralazine for SBP goal <140 > pm craniotomy for hematoma evacuation  4/18 AFRVR 4/23 -  ET tube 9cm above carina. Increased R sided infiltrates. C/o mucus plugging. Afebrile but wbc up. Trach secretions +.  no growth so far. Fio2 up at 60%. Angioedema improved -> reintubted and s.p bronch 4/24 - cards changed to poi cardizem. Gets vent dysnchrony off fentanyl. STill febrile. Neuro feels prognosis bad. Family conference held with patient. Joined by her friend JJenny Reichmannon speaker phone. On mucomyst  [place]  [place]  Stroke team   Procedures:  4/16 ETT >  4/16 CVC>> 4/16 Arterial line > 4/18  4/17 Craniotomy/ no drain  Significant Diagnostic Tests:  4/16 CTH > Large acute intraparenchymal hemorrhage centered in L frontal lobe 8.2 x 6.5 x 5.6 cm, 532mmidline shift.  4/16 CTA H> no aneurysm  4/17 CT H> unchanged size of massive IPH centered in L frontal lobe. Interval increase in amount of blood in R lateral ventricle occipital horn. 87m81midline shift  4/17 MRI Brain >> 1. Unchanged large left frontoparietal parenchymal hematoma with unchanged rightward midline shift. Mild surrounding  edema and restricted diffusion consistent with associated acute infarct. 2. Small volume intraventricular and subarachnoid hemorrhage. 3. Scattered punctate acute infarcts in both cerebral hemispheres suggesting emboli. echo 4/17 with mod PAS elevation/ mild LVH 4/19 CT Head - s/p left frontal parietal craniotomy. No new hemorrhage. Improved midline shift, current 4mm58mtable IVH without hydrocephalus 4/19 EEG -cortical dysfunction in left hemisphere likely secondary to underlying hemorrhage. Additionally, there is evidence of severe diffuse encephalopathy, non specific to etiology.No seizures ordefiniteepileptiform discharges were seen throughout the recording.  Micro Data:  4/16 SARS Cov2   neg  4/16  MRSA PCR  neg  4/18 Respiratory culture-rare GPC's, normal flora  4/.20 blood - neg so far 4/.23  - bronch aspiration - neg so far 4/24 BAL - neg so far  Antimicrobials:  4/16 cefepime > 4/22 4/17 Vanc x1 for OR 4/23 - vanc > 4/25  4/23 - merrem  Interim history/subjective:  Tmax 102.4. Family meeting yesterday as detailed below. No further events overnight.   Objective   Blood pressure 136/62, pulse 78, temperature 100.3 F (37.9 C), temperature source Axillary, resp. rate (!) 30, height _0  (1.626 m), weight 112.4 kg, SpO2 99 %.    Vent Mode: PRVC FiO2 (%):  [40 %] 40 % Set Rate:  [20 bmp] 20 bmp Vt Set:  [440 mL] 440 mL PEEP:  [5 cmH20] 5 cmH20   Intake/Output Summary (Last 24 hours) at 02/20/2020 0826 Last data filed at 02/20/2020 0600 Gross per 24 hour  Intake 960.87 ml  Output 1275 ml  Net -314.13 ml   Filed Weights   02/15/20 0500 02/16/20 0500 02/17/20 2200  Weight: 112.6 kg 112.4 kg 112.4 kg   General Appearance:  Adult female on vent  HEENT: ETT/OG in place, Dry MM. Enlarged/Swollen tongue   Lungs: Clear breath sounds, no crackles/wheeze Heart:  RRR, no MRG Abdomen:  Soft, non-distended, active bowel sounds  Genitalia / Rectal: Intact, external foley in  place  Extremities:  +1 generalized edema  Skin: intact  Neurologic: +cough/gag, eyes open, does not track, does not blink to threat, pupils 4 mm reactive, flexion to physical stimulation, does not follow commands  Resolved Hospital Problem list    Possible angioedema 02/15/20  - hard to differentiate between anasarca  - dc home lisinopril being given and stay off  Assessment & Plan:   Acute hypoxic respiratory failure with failure to protect airway, concern for aspiration PNA -intubated in ED with failed airway protection after vomiting Spontaneous Small Right Apical Pneumo - s/p reintubation 02/16/20 Plan - Continue vent support >> Neuro status barrier for extubation  - VAP bundle, pulm hygiene  - PRN CXR  - Currently on low dose Propofol. Changed to Precedex > Titrate for RASS goal 0/-1. PRN Fentanyl/Versed  -CXR 4/26 with no visible Pnemo. Place Chest Tube to water seal with repeat CXR in AM   Presumed Aspiration PNA  -Bilateral Patchy Airspace Opacities  Febrile State >> could also be secondary to [place] WBC and Fever Curve -Follow for Culture Results  -Continue Merrem (Day 5) >> Stop date placed   Large ICH with cytotoxic cerebral edema  Possible seizure activity s/p Crani  -s/p craniotomy pm 4/17 s drain -S/p 3% saline ended 02/15/20 Plan  -Per Neurology/Neurosugery  -SBP goal < 160 -BID keppra   Volume overload >> I&O may not be completely accurate (Net +12.3L for stay) given Unmeasured output occurrences, however clinically appears volume up -3% saline ending 4/22 Hypokalemia  Plan -Trend BMP -Replace electrolytes as indicated >> Give Dose K this AM with Lasix x 2   Atrial Fibrillation with RVRL: possibly underlying PAF vs new Afib in setting of acute critical illness  HTN -echo 4/17 with mod PAS elevation/ mild LVH Plan -Cardiac Monitoring  -Continue Cardizem   Hyperglycemia >> Improved  Plan -Trend Glucose  -SSI  Acute blood loss anemia  -in  setting of craniotomy 4/17  Plan -Trend CBC -Hemoglobin stable at 7.5 (7.5)   Best practice:  Diet: EN per RDN  Pain/Anxiety/Delirium protocol (if indicated): RASS goal 0, PRN fentanyl PRN versed VAP protocol (if indicated): yes DVT prophylaxis: SQ heparin  GI prophylaxis: protonix  Glucose control: SSI Mobility: BR Code Status: Full  Family Communication:    Extensive family meeting 4/26. Plans to continue current medical care until Thursday. If no improvement will transition to comfort care. No Trach/Peg   Disposition: ICU   CCT: 36 minutes   Hayden Pedro, AGACNP-BC [place]  Pgr: [phone]  PCCM Pgr: [phone]

## 2020-02-20 NOTE — Progress Notes (Signed)
STROKE TEAM PROGRESS NOTE   INTERVAL HISTORY Her RN is at the bedside. . Her neurological condition remains unchanged.  Family meeting yesterday by critical care decided on continuing current care till Thursday if no significant improvement patient would transition to comfort care only and family does not want trach or PEG.  She remains critically ill and is on ventilatory support for respiratory failure.  OBJECTIVE Vitals:   02/20/20 0600 02/20/20 0700 02/20/20 0731 02/20/20 0800  BP: 132/61 136/62 136/62   Pulse: 68 71 78   Resp: 20 (!) 24 (!) 30   Temp:    100.3 F (37.9 C)  TempSrc:    Axillary  SpO2: 97% 98% 99%   Weight:      Height:       CBC:  Recent Labs  Lab 02/17/20 0623 02/17/20 0623 02/18/20 0507 02/18/20 0557 02/19/20 0110 02/20/20 0518  WBC 16.1*   < > 14.9*   < > 15.5* 15.9*  NEUTROABS 11.1*  --  10.6*  --   --   --   HGB 7.7*   < > 7.9*   < > 7.5* 7.5*  HCT 27.0*   < > 27.6*   < > 25.9* 26.0*  MCV 88.5   < > 87.9   < > 86.6 85.5  PLT 310   < > 331   < > 325 331   < > = values in this interval not displayed.   Basic Metabolic Panel:  Recent Labs  Lab 02/19/20 0415 02/19/20 1255 02/20/20 0107 02/20/20 0518  NA 155*   < > 148* 150*  K 3.5  --   --  3.5  CL 120*  --   --  115*  CO2 27  --   --  28  GLUCOSE 152*  --   --  144*  BUN 35*  --   --  33*  CREATININE 0.73  --   --  0.69  CALCIUM 7.9*  --   --  8.0*  MG 2.3  --   --  2.3  PHOS 3.0  --   --  3.3   < > = values in this interval not displayed.    IMAGING No results found.   PHYSICAL EXAM    Middle-aged Caucasian lady who is intubated and on ventilatory support.  Afebrile. Head is nontraumatic. Neck is supple without bruit.    Cardiac exam no murmur or gallop. Lungs are clear to auscultation. Distal pulses are well felt. Neurological Exam :  - intubated on Precedex and as needed fentanyl, eyes closed, not open on voice, not following commands. With eyes forced opening, eyes in primary  position with left exotropia, not blinking to visual threat, doll's eyes sluggish, not tracking, pupil equal but sluggish to light. Corneal reflex weak bilaterally, gag and cough reflex present. Breathing over the vent.  Facial symmetry not able to test due to ET tube.  Tongue protrusion not cooperative. On pain stimulation, left UE slight withdraw but no movement on other limbs. DTR 1+ and bilateral positive babinski. Sensation, coordination and gait not tested.   ASSESSMENT/PLAN Meredith Cooper is a 64 y.o. female with history of Htn, Hld and estrogen therapy presenting with left gaze and right side flaccid. Vomited and was intubated  She did not receive IV t-PA due to [place].  Stroke:  left MCA and bilateral ACA infarcts with large hematoma centered within the left frontal lobe likely due to hemorrhagic conversion s/p crani for  hematoma evacuation - stroke etiology likely due to PAF  CT Head 01/31/2020 - Very large acute parenchymal hemorrhage centered within the left frontal lobe measuring 8.2 x 6.5 x 5.6 cm. 5 mm rightward midline shift at the level of the septum pellucidum.   CT head - 02/08/2020 - Unchanged size of massive intraparenchymal hematoma centered in the left frontal lobe.  MRI head - unchanged hematoma and surrounding edema. DWI signal at left MCA and ACA consistent with acute infarcts. Scattered punctate acute infarcts in both cerebral hemispheres suggesting emboli  CTA H&N - No large vessel occlusion or high-grade arterial stenosis. No evidence of vascular malformation on the current exam.  CT repeat 4/19 - significantly decreased hematoma post evacuation  CT repeat 4/21 - stable residual hematoma and 4 mm of midline shift  CT Head 4/24 - Stable appearance status post evacuation of large left superior hemisphere intra-axial hematoma, aside from decreased pneumocephalus.  2D Echo - EF 65-70%   Sars Corona Virus 2 - negative - Ab positive  LDL - 74  HgbA1c - 5.6  UDS -  negative  VTE prophylaxis - heparin subq  No antithrombotic prior to admission, now on No antithrombotic due to hemorrhage  Therapy recommendations:  Re-order once pt stable for evaluation  Disposition:  Pending  Palliative care consulted to assist dtr and family with goals of care. Met with family. Continue current care to Thursday. They are leaning against trach/PEG.      Acute Respiratory Failure  Intubated for airway protection  CCM on board  Bronch w/ ET repositioning, Cx  No extubation given worsening hypoxemia, infiltrated, increasing WBC (?PNA)  Back on propofol this am  Aspiration Pneumonia Leukocytosis Fever  Pneumothorax, RIght  Nausea vomiting on presentation  Cefepime 4/16 >>4/22  CCM on board  Temp - 102.4  WBCs - 15.9  CXR 4/26 - No visible PNX. Stable patchy B airspace opacities  UA neg  Resp Cx 4/18 normal flora  Blood Cx 4/20 NGTD    Resp Cx 4/21 no growth  Resp Cx 4/23 normal  Vancomycin 4/23>>4/25  Meropenem 4/23>>  Cerebral edema  5 mm Rightward Midline Shift on CT and MRI  NSG on board s/p hematoma evacuation 4/17   CT 4/21 stable residual hematoma and midline shift  23.4% saline x1  Treated with 3%, now off   Na - 150   On free water 100cc Q4h    Atrial Fibrillation w/ RVR  New diagnosis  Given her embolic strokes on MRI, pt likely has PAF before admission  Now back to NSR  Amiodarone discontinued  On cardizem IV - transitioned to po cardizem  Cardiology consulted - appreciate help  May consider AC once blood absorbed in the future (likely months before being able to add)  UE shivering/shaking  Bilateral versus unilateral arm shaking/shivering  LTM EEG no seizure to correlate the shaking -> discontinued 4/19  Keppra - 500 mg Q 12 hrs after Keppra 2 g load  Seizure precautions  Hypertension  Home BP meds: Zestril 10 mg daily  off Cleviprex and normodyne (infusion)  On cardizem  drip->po  Added lisinopril 40m bid -> off d/t eyelid swelling . SBP goal < 160 mm Hg  . Stable  Hyperlipidemia  Home Lipid lowering medication: Pravachol 20 mg daily  LDL 74, goal < 70  Current lipid lowering medication: None for now  May resume statin at discharge vs. SATURN trial  Dysphagia At risk malnutrition   N.p.o.  On tube  feeding at 45cc/hr  Free water 1000 q2h  Speech following  Dietitian on board  Anemia, acute blood loss  Hb - 7.5  Likely due to post op  S/p PRBC in OR  Iron 16 and ferritin 66  Iron pills added  CBC monitoring  Other Stroke Risk Factors  Advanced age  Morbid Obesity, Body mass index is 42.53 kg/m., recommend weight loss, diet and exercise as appropriate   On estrogen daily po PTA  Other Active Problems  Code status - Limited Code  Hypokalemia - treated - K 3.5  Hypophosphatemia - treated - Phos 3.3  Family requested COVID Ab testing after COVID vaccine - serology reactive  Right eyelid swollen - d/c lisinopril   Hospital day # 11 Patient's neurological condition remains poor without meaningful improvement despite hemicraniectomy from her large intracerebral hemorrhage.  Family understands poor prognosis and wants to continue support for a few more days but agreed to one-way extubation and DNR and comfort care if she does not get better in the next few days.  I am in agreement with the plan.  Discussed with critical care team  This patient is critically ill and at significant risk of neurological worsening, death and care requires constant monitoring of vital signs, hemodynamics,respiratory and cardiac monitoring, extensive review of multiple databases, frequent neurological assessment, discussion with family, other specialists and medical decision making of high complexity.I have made any additions or clarifications directly to the above note.This critical care time does not reflect procedure time, or teaching time or  supervisory time of PA/NP/Med Resident etc but could involve care discussion time. I spent 30 minutes of neurocritical care time  in the care of  this patient.    Antony Contras, MD  To contact Stroke Continuity provider, please refer to [email or web address] After hours, contact General Neurology

## 2020-02-21 ENCOUNTER — Inpatient Hospital Stay (HOSPITAL_COMMUNITY): Payer: Medicare Other

## 2020-02-21 DIAGNOSIS — Z978 Presence of other specified devices: Secondary | ICD-10-CM

## 2020-02-21 DIAGNOSIS — Z7189 Other specified counseling: Secondary | ICD-10-CM | POA: Diagnosis not present

## 2020-02-21 DIAGNOSIS — Z515 Encounter for palliative care: Secondary | ICD-10-CM | POA: Diagnosis not present

## 2020-02-21 DIAGNOSIS — I619 Nontraumatic intracerebral hemorrhage, unspecified: Secondary | ICD-10-CM | POA: Diagnosis not present

## 2020-02-21 LAB — CBC
HCT: 25.8 % — ABNORMAL LOW (ref 36.0–46.0)
Hemoglobin: 7.6 g/dL — ABNORMAL LOW (ref 12.0–15.0)
MCH: 25.1 pg — ABNORMAL LOW (ref 26.0–34.0)
MCHC: 29.5 g/dL — ABNORMAL LOW (ref 30.0–36.0)
MCV: 85.1 fL (ref 80.0–100.0)
Platelets: 350 10*3/uL (ref 150–400)
RBC: 3.03 MIL/uL — ABNORMAL LOW (ref 3.87–5.11)
RDW: 15.8 % — ABNORMAL HIGH (ref 11.5–15.5)
WBC: 16.2 10*3/uL — ABNORMAL HIGH (ref 4.0–10.5)
nRBC: 0.4 % — ABNORMAL HIGH (ref 0.0–0.2)

## 2020-02-21 LAB — GLUCOSE, CAPILLARY
Glucose-Capillary: 131 mg/dL — ABNORMAL HIGH (ref 70–99)
Glucose-Capillary: 138 mg/dL — ABNORMAL HIGH (ref 70–99)
Glucose-Capillary: 129 mg/dL — ABNORMAL HIGH (ref 70–99)
Glucose-Capillary: 128 mg/dL — ABNORMAL HIGH (ref 70–99)

## 2020-02-21 LAB — BASIC METABOLIC PANEL
Anion gap: 10 (ref 5–15)
BUN: 34 mg/dL — ABNORMAL HIGH (ref 8–23)
CO2: 25 mmol/L (ref 22–32)
Calcium: 8.1 mg/dL — ABNORMAL LOW (ref 8.9–10.3)
Chloride: 113 mmol/L — ABNORMAL HIGH (ref 98–111)
Creatinine, Ser: 0.78 mg/dL (ref 0.44–1.00)
GFR calc Af Amer: >60 mL/min (ref >60)
GFR calc non Af Amer: >60 mL/min (ref >60)
Glucose, Bld: 147 mg/dL — ABNORMAL HIGH (ref 70–99)
Potassium: 4.1 mmol/L (ref 3.5–5.1)
Sodium: 148 mmol/L — ABNORMAL HIGH (ref 135–145)

## 2020-02-21 LAB — MAGNESIUM: Magnesium: 2.3 mg/dL (ref 1.7–2.4)

## 2020-02-21 LAB — PHOSPHORUS: Phosphorus: 3.7 mg/dL (ref 2.5–4.6)

## 2020-02-21 NOTE — Progress Notes (Signed)
NAME:  Meredith Cooper, MRN:  [id number], DOB:  04/13/1956, LOS: 12 ADMISSION DATE:  02/22/2020, CONSULTATION DATE:  02/04/2020 REFERRING MD:  Leonie Man - neuro, CHIEF COMPLAINT:  Code stroke - L gaze deviation, R sided weakness   Brief History   32 yowf never smoker  presented to [facility] ED 4/16 as code stroke with sudden onset R sided weakness, L gaze deviation. Rapid deterioration in ED; patient vomited multiple times in CT, then required intubation for airway protection. CTH with large acute parenchymal hemorrhage in L frontal lobe, 8.2 x 6.5 x 5.6 cm, 27m midline shift.   Past Medical History  HTN HLD Hiatal hernia GERD S/p hysterectomy   Significant Hospital Events   4/16 Code stroke, large ICH, Intubated.  4/17 overnight possible seizure activity, received ativan and started Keppra. Max on cleviprex, added PRN hydralazine for SBP goal <140 > pm craniotomy for hematoma evacuation  4/18 AFRVR 4/23 -  ET tube 9cm above carina. Increased R sided infiltrates. C/o mucus plugging. Afebrile but wbc up. Trach secretions +.  no growth so far. Fio2 up at 60%. Angioedema improved -> reintubted and s.p bronch 4/24 - cards changed to poi cardizem. Gets vent dysnchrony off fentanyl. STill febrile. Neuro feels prognosis bad. Family conference held with patient. Joined by her friend JJenny Reichmannon speaker phone. On mucomyst  Consults:  [place]  Stroke team   Procedures:  4/16 ETT >  4/16 CVC>> 4/16 Arterial line > 4/18  4/17 Craniotomy/ no drain  Significant Diagnostic Tests:  4/16 CTH > Large acute intraparenchymal hemorrhage centered in L frontal lobe 8.2 x 6.5 x 5.6 cm, 523mmidline shift.  4/16 CTA H> no aneurysm  4/17 CT H> unchanged size of massive IPH centered in L frontal lobe. Interval increase in amount of blood in R lateral ventricle occipital horn. 69m70midline shift  4/17 MRI Brain >> 1. Unchanged large left frontoparietal parenchymal hematoma with unchanged rightward midline shift. Mild surrounding  edema and restricted diffusion consistent with associated acute infarct. 2. Small volume intraventricular and subarachnoid hemorrhage. 3. Scattered punctate acute infarcts in both cerebral hemispheres suggesting emboli. echo 4/17 with mod PAS elevation/ mild LVH 4/19 CT Head - s/p left frontal parietal craniotomy. No new hemorrhage. Improved midline shift, current 4mm40mtable IVH without hydrocephalus 4/19 EEG -cortical dysfunction in left hemisphere likely secondary to underlying hemorrhage. Additionally, there is evidence of severe diffuse encephalopathy, non specific to etiology.No seizures ordefiniteepileptiform discharges were seen throughout the recording.  Micro Data:  4/16 SARS Cov2   neg  4/16  MRSA PCR  neg  4/18 Respiratory culture-rare GPC's, normal flora  4/.20 blood - neg so far 4/.23  - bronch aspiration - neg so far 4/24 BAL - neg so far  Antimicrobials:  4/16 cefepime > 4/22 4/17 Vanc x1 for OR 4/23 - vanc > 4/25  4/23 - merrem-4/28  Interim history/subjective:  NAEO PSV/CPAP with variable patient effort, concerning for CS pattern-- adequate pulm effort followed by apnea, with resumption of adequate effort, in a cyclical fashion.   I have discontinued mero 4/28  Objective   Blood pressure (!) 122/51, pulse 74, temperature 99 F (37.2 C), temperature source Axillary, resp. rate (!) 25, height _0  (1.626 m), weight 112.4 kg, SpO2 100 %.    Vent Mode: PRVC FiO2 (%):  [40 %] 40 % Set Rate:  [20 bmp] 20 bmp Vt Set:  [440 mL] 440 mL PEEP:  [5 cmH20] 5 cmH20 Plateau Pressure:  [15 cmH20-16  cmH20] 15 cmH20   Intake/Output Summary (Last 24 hours) at 02/21/2020 0752 Last data filed at 02/21/2020 0600 Gross per 24 hour  Intake 3511.4 ml  Output 2355 ml  Net 1156.4 ml   Filed Weights   02/15/20 0500 02/16/20 0500 02/17/20 2200  Weight: 112.6 kg 112.4 kg 112.4 kg   General Appearance:  Adult female on vent  HEENT:  ETT OGT secure. Pink mmm. Clear oral  secretions.  Lungs: Symmetrical chest expansion. CTA bilaterally. Pattern of breathing concerning for possible Cheyne stokes  Heart:  RRR s1s2 no rgm cap refill < 3 seconds  Abdomen:  Obese soft ndnt. Normoactive. BMS with brown stool  GU: clear yellow urine  Extremities:  BUE BLE 1+ edema. No obvious joint deformity. No cyanosis or clubbing  Skin: pale, c/d/w without rash  Neurologic: + cough gag. PERRL sluggish 91m. Bilateral plantar flexion to painful stimuli. Moving LUE spontaneously but without purposeful movement. Does not follow commands.   Resolved Hospital Problem list    Possible angioedema 02/15/20  - hard to differentiate between anasarca  - dc home lisinopril being given and stay off  Assessment & Plan:   Acute hypoxic respiratory failure with failure to protect airway, concern for aspiration PNA Acute hypoxic respiratory failure with inability to protect airway,  -intubated in ED with failed airway protection after vomiting - s/p reintubation 02/16/20 Plan - Continue vent support, neuro status ongoing barrier  - VAP bundle, pulm hygiene  - PRN CXR - PRN sedation  Small Right Sided Pneumothorax  -s/p thoracostomy placement 4/25 -R chest tube with minimal output. Continues on water seal 4/28, Plan  -Water seal; consider dc 4/28 v 4/29.   Presumed Aspiration PNA  -Bilateral Patchy Airspace Opacities  -had 7 d course cefepime, vanc x 4 days and mero 5 Plan -Trend WBC and Fever Curve - Mero ordered as 7 day course ( to end 4/29), however with no growth from culture data, will dc 4/28.  - tracheal aspirate with no organisms seen   Fever -intermittent, despite abx course (see above) and without gross fluctuation in leukocytosis. Consider neurogenic etiology as culture data unremarkable  Plan -Trend WBC, fever curve   Large ICH with cytotoxic cerebral edema  Possible seizure activity s/p Crani  -s/p craniotomy pm 4/17 s drain -S/p 3% saline ended 02/15/20 Plan    -Per Neurology/Neurosugery  -SBP goal < 160 -BID keppra   Volume overload  -3% saline ending 4/22 Hypokalemia  Hypernatremia, mild  Plan -Trend BMP, replace electrolytes as indicated  Atrial Fibrillation with RVR, : possibly underlying PAF vs new Afib in setting of acute critical illness  -currently NSR  HTN -echo 4/17 with mod PAS elevation/ mild LVH Plan -continue ICU monitoring -Continue Cardizem   Hyperglycemia Plan -SSI  Acute blood loss anemia  -in setting of craniotomy 4/17  Plan - Trend H/H  - Hgb stable   Goals of Care Plan -Extensive family meeting 4/26. Plans to continue current medical care until Thursday. If no improvement will likely transition to comfort care. Husband states no Trach/Peg  -Palliative care is following, appreciate ongoing assistance   Best practice:  Diet: EN per RDN  Pain/Anxiety/Delirium protocol (if indicated): RASS goal 0, PRN fentanyl PRN versed VAP protocol (if indicated): yes DVT prophylaxis: SQ heparin  GI prophylaxis: protonix  Glucose control: SSI Mobility: BR Code Status: Full  Family Communication:  Discussed at bedside 4/28. Pt daughter vocalizes that patient does not want prolonged life support. Sister believes that  God will provide, and patient will recover to prior baseline. At request of daughter we discussed comfort care process, however was asked to stop discussing by pt's sister who found this line of discussion too uncomfortable and unnecessary given hope for God's provision.  Disposition: ICU    CRITICAL CARE Performed by: Cristal Generous  Total critical care time: 40 minutes  Critical care time was exclusive of separately billable procedures and treating other patients.  Critical care was necessary to treat or prevent imminent or life-threatening deterioration.  Critical care was time spent personally by me on the following activities: development of treatment plan with patient and/or surrogate as well as  nursing, discussions with consultants, evaluation of patient's response to treatment, examination of patient, obtaining history from patient or surrogate, ordering and performing treatments and interventions, ordering and review of laboratory studies, ordering and review of radiographic studies, pulse oximetry and re-evaluation of patient's condition.  Eliseo Gum MSN, AGACNP-BC [place] [phone] If no answer, [phone] 02/21/2020, 8:50 AM

## 2020-02-21 NOTE — Progress Notes (Signed)
STROKE TEAM PROGRESS NOTE   INTERVAL HISTORY Her RN is at the bedside. . Her neurological condition remains unchanged.  I met with patient's daughter and sister during a.m. rounds and separately with her husband in the afternoon.  Family is leaning towards transition to comfort care only and family does not want trach or PEG.  Family plans to meet with palliative care tomorrow morning she remains critically ill and is on ventilatory support for respiratory failure.  OBJECTIVE Vitals:   02/21/20 1100 02/21/20 1152 02/21/20 1200 02/21/20 1524  BP: (!) 130/55  132/62   Pulse: 75 69 72 92  Resp: (!) 26 19 (!) 27 (!) 26  Temp:   100 F (37.8 C)   TempSrc:   Axillary   SpO2: 100% 100% 100% 100%  Weight:      Height:       CBC:  Recent Labs  Lab 02/17/20 0623 02/17/20 0623 02/18/20 0507 02/18/20 0557 02/20/20 0518 02/21/20 0527  WBC 16.1*   < > 14.9*   < > 15.9* 16.2*  NEUTROABS 11.1*  --  10.6*  --   --   --   HGB 7.7*   < > 7.9*   < > 7.5* 7.6*  HCT 27.0*   < > 27.6*   < > 26.0* 25.8*  MCV 88.5   < > 87.9   < > 85.5 85.1  PLT 310   < > 331   < > 331 350   < > = values in this interval not displayed.   Basic Metabolic Panel:  Recent Labs  Lab 02/20/20 0518 02/21/20 0527  NA 150* 148*  K 3.5 4.1  CL 115* 113*  CO2 28 25  GLUCOSE 144* 147*  BUN 33* 34*  CREATININE 0.69 0.78  CALCIUM 8.0* 8.1*  MG 2.3 2.3  PHOS 3.3 3.7    IMAGING DG CHEST PORT 1 VIEW  Result Date: 02/21/2020 CLINICAL DATA:  Stroke. Endotracheally intubated. EXAM: PORTABLE CHEST 1 VIEW COMPARISON:  02/19/2020 FINDINGS: Endotracheal tube, feeding tube, and right jugular central venous catheter remain in appropriate position. A right pleural pigtail catheter also remains in place. No pneumothorax or significant pleural effusion visualized. Previously seen infiltrates in the right upper lobe and left lower lung show improvement since prior study. No new or worsening areas of opacity are seen. Heart size is  stable. IMPRESSION: Improving right upper lobe and left lower lung infiltrates. Electronically Signed   By: Marlaine Hind M.D.   On: 02/21/2020 08:12     PHYSICAL EXAM    Middle-aged Caucasian lady who is intubated and on ventilatory support.  Afebrile. Head is nontraumatic. Neck is supple without bruit.    Cardiac exam no murmur or gallop. Lungs are clear to auscultation. Distal pulses are well felt. Neurological Exam :  - intubated on Precedex and as needed fentanyl, eyes closed, not open on voice, not following commands. With eyes forced opening, eyes in primary position with left exotropia, not blinking to visual threat, doll's eyes sluggish, not tracking, pupil equal but sluggish to light. Corneal reflex weak bilaterally, gag and cough reflex present. Breathing over the vent.  Facial symmetry not able to test due to ET tube.  Tongue protrusion not cooperative. On pain stimulation, left UE slight withdraw but no movement on other limbs. DTR 1+ and bilateral positive babinski. Sensation, coordination and gait not tested.   ASSESSMENT/PLAN Meredith Cooper is a 64 y.o. female with history of Htn, Hld and estrogen therapy  presenting with left gaze and right side flaccid. Vomited and was intubated  She did not receive IV t-PA due to [place].  Stroke:  left MCA and bilateral ACA infarcts with large hematoma centered within the left frontal lobe likely due to hemorrhagic conversion s/p crani for hematoma evacuation - stroke etiology likely due to PAF  CT Head 01/27/2020 - Very large acute parenchymal hemorrhage centered within the left frontal lobe measuring 8.2 x 6.5 x 5.6 cm. 5 mm rightward midline shift at the level of the septum pellucidum.   CT head - 01/27/2020 - Unchanged size of massive intraparenchymal hematoma centered in the left frontal lobe.  MRI head - unchanged hematoma and surrounding edema. DWI signal at left MCA and ACA consistent with acute infarcts. Scattered punctate acute infarcts in both  cerebral hemispheres suggesting emboli  CTA H&N - No large vessel occlusion or high-grade arterial stenosis. No evidence of vascular malformation on the current exam.  CT repeat 4/19 - significantly decreased hematoma post evacuation  CT repeat 4/21 - stable residual hematoma and 4 mm of midline shift  CT Head 4/24 - Stable appearance status post evacuation of large left superior hemisphere intra-axial hematoma, aside from decreased pneumocephalus.  2D Echo - EF 65-70%   Sars Corona Virus 2 - negative - Ab positive  LDL - 74  HgbA1c - 5.6  UDS - negative  VTE prophylaxis - heparin subq  No antithrombotic prior to admission, now on No antithrombotic due to hemorrhage  Therapy recommendations:  Re-order once pt stable for evaluation  Disposition:  Pending  Palliative care consulted to assist dtr and family with goals of care. Met with family. Continue current care to Thursday. They are leaning against trach/PEG.      Acute Respiratory Failure  Intubated for airway protection  CCM on board  Bronch w/ ET repositioning, Cx  No extubation given worsening hypoxemia, infiltrated, increasing WBC (?PNA)  Back on propofol this am  Aspiration Pneumonia Leukocytosis Fever  Pneumothorax, RIght  Nausea vomiting on presentation  Cefepime 4/16 >>4/22  CCM on board  Temp - 102.4  WBCs - 15.9  CXR 4/26 - No visible PNX. Stable patchy B airspace opacities  UA neg  Resp Cx 4/18 normal flora  Blood Cx 4/20 NGTD    Resp Cx 4/21 no growth  Resp Cx 4/23 normal  Vancomycin 4/23>>4/25  Meropenem 4/23>>  Cerebral edema  5 mm Rightward Midline Shift on CT and MRI  NSG on board s/p hematoma evacuation 4/17   CT 4/21 stable residual hematoma and midline shift  23.4% saline x1  Treated with 3%, now off   Na - 150   On free water 100cc Q4h    Atrial Fibrillation w/ RVR  New diagnosis  Given her embolic strokes on MRI, pt likely has PAF before  admission  Now back to NSR  Amiodarone discontinued  On cardizem IV - transitioned to po cardizem  Cardiology consulted - appreciate help  May consider AC once blood absorbed in the future (likely months before being able to add)  UE shivering/shaking  Bilateral versus unilateral arm shaking/shivering  LTM EEG no seizure to correlate the shaking -> discontinued 4/19  Keppra - 500 mg Q 12 hrs after Keppra 2 g load  Seizure precautions  Hypertension  Home BP meds: Zestril 10 mg daily  off Cleviprex and normodyne (infusion)  On cardizem drip->po  Added lisinopril 69m bid -> off d/t eyelid swelling . SBP goal < 160 mm Hg  .  Stable  Hyperlipidemia  Home Lipid lowering medication: Pravachol 20 mg daily  LDL 74, goal < 70  Current lipid lowering medication: None for now  May resume statin at discharge vs. SATURN trial  Dysphagia At risk malnutrition   N.p.o.  On tube feeding at 45cc/hr  Free water 1000 q2h  Speech following  Dietitian on board  Anemia, acute blood loss  Hb - 7.5  Likely due to post op  S/p PRBC in OR  Iron 16 and ferritin 66  Iron pills added  CBC monitoring  Other Stroke Risk Factors  Advanced age  Morbid Obesity, Body mass index is 42.53 kg/m., recommend weight loss, diet and exercise as appropriate   On estrogen daily po PTA  Other Active Problems  Code status - Limited Code  Hypokalemia - treated - K 3.5  Hypophosphatemia - treated - Phos 3.3  Family requested COVID Ab testing after COVID vaccine - serology reactive  Right eyelid swollen - d/c lisinopril   Hospital day # 12 Patient's neurological condition remains poor without meaningful improvement despite hemicraniectomy from her large intracerebral hemorrhage.  Family understands poor prognosis and now have agreed to one-way extubation and DNR and comfort care after palliative care meeting tomorrow morning I am in agreement with the plan.  Discussed with  critical care and palliative care team  This patient is critically ill and at significant risk of neurological worsening, death and care requires constant monitoring of vital signs, hemodynamics,respiratory and cardiac monitoring, extensive review of multiple databases, frequent neurological assessment, discussion with family, other specialists and medical decision making of high complexity.I have made any additions or clarifications directly to the above note.This critical care time does not reflect procedure time, or teaching time or supervisory time of PA/NP/Med Resident etc but could involve care discussion time. I spent 30 minutes of neurocritical care time  in the care of  this patient.    Antony Contras, MD  To contact Stroke Continuity provider, please refer to [email or web address] After hours, contact General Neurology

## 2020-02-21 NOTE — Progress Notes (Signed)
Daily Progress Note   Patient Name: Meredith Cooper       Date: 02/21/2020 DOB: 1956-08-04  Age: 64 y.o. MRN#: [id number] Attending Physician: Garvin Fila, MD Primary Care Physician: Wayland Denis, PA-C Admit Date: 02/08/2020  Reason for Consultation/Follow-up: Establishing goals of care  Subjective: Eyes open spontaneously but non-purposeful. Does not track. Does not follow commands. Remains intubated.   GOC:  Spoke with daughter, Meredith Cooper at bedside this morning. Again discussed diagnoses, interventions, plan of care including continued aggressive medical management versus comfort focused care plan. Discussed one-way extubation and shift to comfort. Meredith Cooper is hoping/praying for a miracle but also very realistic with poor prognosis and understands/respects her mother's wishes against prolonged heroic interventions. She feels her and her father will be ready to extubate tomorrow. It would be important for other family members to visit prior to extubation. Meredith Cooper will call me this afternoon once she speaks with her father about time for tomorrow. Answered questions. Reassured of ongoing support from PMT.   This afternoon, received call from [employer]. Family wishes to meet with PMT provider 4/29 at [employer] and likely will be ready to one-way extubate to comfort. Allow family to visit this evening with plans for shift to comfort tomorrow. Discussed again with Tonya via telephone this afternoon. Answered questions.   Length of Stay: 12  Current Medications: Scheduled Meds:  . chlorhexidine gluconate (MEDLINE KIT)  15 mL Mouth Rinse BID  . Chlorhexidine Gluconate Cloth  6 each Topical Daily  . diltiazem  30 mg Per Tube Q6H  . docusate  100 mg Per Tube BID  . feeding supplement (PRO-STAT SUGAR FREE 64)   30 mL Per Tube TID  . ferrous sulfate  300 mg Per Tube BID WC  . free water  100 mL Per Tube Q4H  . heparin injection (subcutaneous)  5,000 Units Subcutaneous Q8H  . insulin aspart  0-9 Units Subcutaneous Q4H  . levETIRAcetam  500 mg Per Tube BID  . levothyroxine  112 mcg Per Tube Q0600  . mouth rinse  15 mL Mouth Rinse 10 times per day  . pantoprazole sodium  40 mg Per Tube Daily  . senna-docusate  1 tablet Per Tube BID  . sodium chloride flush  10-40 mL Intracatheter Q12H    Continuous Infusions: . sodium chloride Stopped (02/16/20  6222)  . feeding supplement (VITAL HIGH PROTEIN) 45 mL/hr at 02/21/20 0800    PRN Meds: acetaminophen **OR** acetaminophen (TYLENOL) oral liquid 160 mg/5 mL **OR** acetaminophen, albuterol, fentaNYL (SUBLIMAZE) injection, hydrALAZINE, labetalol, midazolam, sodium chloride flush  Physical Exam Vitals and nursing note reviewed.  Constitutional:      Interventions: She is sedated and intubated.  HENT:     Head: Normocephalic and atraumatic.  Pulmonary:     Effort: No tachypnea, accessory muscle usage or respiratory distress. She is intubated.     Breath sounds: Normal breath sounds.  Skin:    General: Skin is warm and dry.  Neurological:     Comments: Does not follow commands. Does not track. Responds to painful stimuli. Intermittently opens eyes but with non-purposeful response. + gag/cough            Vital Signs: BP (!) 123/56   Pulse 68   Temp 100.2 F (37.9 C) (Axillary)   Resp (!) 26   Ht '5\' 4"'  (1.626 m)   Wt 112.4 kg   SpO2 100%   BMI 42.53 kg/m  SpO2: SpO2: 100 % O2 Device: O2 Device: Ventilator O2 Flow Rate:    Intake/output summary:   Intake/Output Summary (Last 24 hours) at 02/21/2020 1136 Last data filed at 02/21/2020 0900 Gross per 24 hour  Intake 3384 ml  Output 2400 ml  Net 984 ml   LBM: Last BM Date: 02/20/20 Baseline Weight: Weight: 109.3 kg Most recent weight: Weight: 112.4 kg       Palliative Assessment/Data:  PPS 10%      Patient Active Problem List   Diagnosis Date Noted  . Hemorrhagic stroke ([place])   . Endotracheally intubated   . Palliative care by specialist   . VAP (ventilator-associated pneumonia) ([place])   . Acute respiratory failure with hypoxemia ([place]) 02/11/2020  . Acute blood loss anemia 02/11/2020  . Stroke ([place]) 02/02/2020  . Obesity (BMI 30-39.9) 12/18/2018  . Post-menopause on HRT (hormone replacement therapy) 12/18/2018  . Essential hypertension 12/18/2018  . Hyperlipidemia 12/18/2018  . Incomplete bladder emptying 12/18/2018  . 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 Rectocele 12/10/2017  . Cystocele, midline 12/10/2017  . Status post hysterectomy 12/09/2016  . Goals of care, counseling/discussion 04/22/2016  . SUI (stress urinary incontinence, female) 04/22/2016    Palliative Care Assessment & Plan   Patient Profile: 64 y.o. female  with past medical history of thyroid disease, hypertension, hyperlipidemia, hiatal hernia, GERD admitted on 02/19/2020 with altered mental status, flaccid right side, and left gaze. Found to have large left frontal hemorrhage, left MCA and bilateral ACA infarcts with large hematoma centered within left frontal lobe likely hemorrhagic conversion s/p decompressive craniectomy. PCCM and neurology following. Poor neurological recovery and poor prognosis. Palliative medicine consultation for goals of care.   Assessment: Large ICH  Acute hypoxic respiratory failure Presumed aspiration pneumonia Spontaneous small right apical pneumothorax Afib with RVR  Recommendations/Plan:  Continue current plan of care and medical management until Thursday. No trach/peg. PMT provider meeting with husband and daughter 4/29 at [facility] compassionate extubation to comfort measures only. Patient has a documented living will and has clearly spoken of her wishes against prolonged heroic measures. Husband/daughter understand and respect her wishes.   Allow family to  start visiting this evening. Discussed with RN.   Code Status: Limited   Code Status Orders  (From admission, onward)         Start     Ordered   02/17/20 1143  Limited  resuscitation (code)  Continuous    Comments: Pressors ok  Question Answer Comment  In the event of cardiac or respiratory ARREST: Initiate Code Blue, Call Rapid Response Yes   In the event of cardiac or respiratory ARREST: Perform CPR No   In the event of cardiac or respiratory ARREST: Perform Intubation/Mechanical Ventilation Yes   In the event of cardiac or respiratory ARREST: Use NIPPV/BiPAp only if indicated No   In the event of cardiac or respiratory ARREST: Administer ACLS medications if indicated No   In the event of cardiac or respiratory ARREST: Perform Defibrillation or Cardioversion if indicated No      02/17/20 1200        Code Status History    Date Active Date Inactive Code Status Order ID Comments User Context   02/17/2020 1529 02/17/2020 1200 Full Code [id number]  Vonzella Nipple, NP ED   Advance Care Planning Activity       Prognosis:   Poor prognosis  Discharge Planning:  To Be Determined: anticipate hospital death once compassionately extubated to comfort.   Care plan was discussed with RN, daughter Meredith Cooper) x2 today  Thank you for allowing the Palliative Medicine Team to assist in the care of this patient.  Total time: 40 minutes              Greater than 50% of this time was spent counseling and coordinating care related to the above assessment and plan.   Meredith Dow, DNP, FNP-C Palliative Medicine Team  Phone: [phone] Fax: [phone]  Please contact Palliative Medicine Team phone at [phone] for questions and concerns.

## 2020-02-22 DIAGNOSIS — K117 Disturbances of salivary secretion: Secondary | ICD-10-CM | POA: Diagnosis not present

## 2020-02-22 DIAGNOSIS — Z7189 Other specified counseling: Secondary | ICD-10-CM | POA: Diagnosis not present

## 2020-02-22 DIAGNOSIS — Z515 Encounter for palliative care: Secondary | ICD-10-CM | POA: Diagnosis not present

## 2020-02-22 DIAGNOSIS — J9601 Acute respiratory failure with hypoxia: Secondary | ICD-10-CM | POA: Diagnosis not present

## 2020-02-22 DIAGNOSIS — I619 Nontraumatic intracerebral hemorrhage, unspecified: Secondary | ICD-10-CM | POA: Diagnosis not present

## 2020-02-22 LAB — GLUCOSE, CAPILLARY
Glucose-Capillary: 120 mg/dL — ABNORMAL HIGH (ref 70–99)
Glucose-Capillary: 127 mg/dL — ABNORMAL HIGH (ref 70–99)
Glucose-Capillary: 129 mg/dL — ABNORMAL HIGH (ref 70–99)
Glucose-Capillary: 129 mg/dL — ABNORMAL HIGH (ref 70–99)

## 2020-02-22 MED ORDER — MIDAZOLAM HCL 2 MG/2ML IJ SOLN: 1.0000 mg | INTRAMUSCULAR | Status: DC | PRN

## 2020-02-22 MED ORDER — MORPHINE BOLUS VIA INFUSION
2.0000 mg | INTRAVENOUS | Status: DC | PRN
  Administered 2020-02-24 – 2020-02-27 (×2): 2 mg via INTRAVENOUS
  Filled 2020-02-22: qty 4

## 2020-02-22 MED ORDER — GLYCOPYRROLATE 0.2 MG/ML IJ SOLN
0.2000 mg | INTRAMUSCULAR | Status: DC
  Administered 2020-02-22 – 2020-02-27 (×28): 0.2 mg via INTRAVENOUS
  Filled 2020-02-22 (×27): qty 1

## 2020-02-22 MED ORDER — MORPHINE 100MG IN NS 100ML (1MG/ML) PREMIX INFUSION
5.0000 mg/h | INTRAVENOUS | Status: DC
  Administered 2020-02-22: 2 mg/h via INTRAVENOUS
  Administered 2020-02-23: 3 mg/h via INTRAVENOUS
  Administered 2020-02-25 – 2020-02-26 (×2): 4 mg/h via INTRAVENOUS
  Administered 2020-02-27: 5 mg/h via INTRAVENOUS
  Filled 2020-02-22 (×5): qty 100

## 2020-02-22 MED ORDER — SCOPOLAMINE 1 MG/3DAYS TD PT72
1.0000 | MEDICATED_PATCH | TRANSDERMAL | Status: DC
  Administered 2020-02-22 – 2020-02-25 (×2): 1.5 mg via TRANSDERMAL
  Filled 2020-02-22 (×3): qty 1

## 2020-02-22 MED ORDER — GLYCOPYRROLATE 0.2 MG/ML IJ SOLN: 0.2000 mg | INTRAMUSCULAR | Status: DC | PRN

## 2020-02-22 NOTE — Progress Notes (Signed)
NAME:  Meredith Cooper, MRN:  [id number], DOB:  January 20, 1956, LOS: 50 ADMISSION DATE:  01/26/2020, CONSULTATION DATE:  02/01/2020 REFERRING MD:  Leonie Man - neuro, CHIEF COMPLAINT:  Code stroke - L gaze deviation, R sided weakness   Brief History   39 yowf never smoker  presented to [facility] ED 4/16 as code stroke with sudden onset R sided weakness, L gaze deviation. Rapid deterioration in ED; patient vomited multiple times in CT, then required intubation for airway protection. CTH with large acute parenchymal hemorrhage in L frontal lobe, 8.2 x 6.5 x 5.6 cm, 13m midline shift.   Past Medical History  HTN HLD Hiatal hernia GERD S/p hysterectomy   Significant Hospital Events   4/16 Code stroke, large ICH, Intubated.  4/17 overnight possible seizure activity, received ativan and started Keppra. Max on cleviprex, added PRN hydralazine for SBP goal <140 > pm craniotomy for hematoma evacuation  4/18 AFRVR 4/23 -  ET tube 9cm above carina. Increased R sided infiltrates. C/o mucus plugging. Afebrile but wbc up. Trach secretions +.  no growth so far. Fio2 up at 60%. Angioedema improved -> reintubted and s.p bronch 4/24 - cards changed to poi cardizem. Gets vent dysnchrony off fentanyl. STill febrile. Neuro feels prognosis bad. Family conference held with patient. Joined by her friend JJenny Reichmannon speaker phone. On mucomyst  Consults:  NAlturaPCCM  Stroke team   Procedures:  4/16 ETT >  4/16 CVC>> 4/16 Arterial line > 4/18  4/17 Craniotomy/ no drain  Significant Diagnostic Tests:  4/16 CTH > Large acute intraparenchymal hemorrhage centered in L frontal lobe 8.2 x 6.5 x 5.6 cm, 58mmidline shift.  4/16 CTA H> no aneurysm  4/17 CT H> unchanged size of massive IPH centered in L frontal lobe. Interval increase in amount of blood in R lateral ventricle occipital horn. 70m570midline shift  4/17 MRI Brain >> 1. Unchanged large left frontoparietal parenchymal hematoma with unchanged rightward midline shift. Mild surrounding  edema and restricted diffusion consistent with associated acute infarct. 2. Small volume intraventricular and subarachnoid hemorrhage. 3. Scattered punctate acute infarcts in both cerebral hemispheres suggesting emboli. echo 4/17 with mod PAS elevation/ mild LVH 4/19 CT Head - s/p left frontal parietal craniotomy. No new hemorrhage. Improved midline shift, current 4mm24mtable IVH without hydrocephalus 4/19 EEG -cortical dysfunction in left hemisphere likely secondary to underlying hemorrhage. Additionally, there is evidence of severe diffuse encephalopathy, non specific to etiology.No seizures ordefiniteepileptiform discharges were seen throughout the recording.  Micro Data:  4/16 SARS Cov2   neg  4/16  MRSA PCR  neg  4/18 Respiratory culture-rare GPC's, normal flora  4/.20 blood - neg so far 4/.23  - bronch aspiration - neg so far 4/24 BAL - neg so far  Antimicrobials:  4/16 cefepime > 4/22 4/17 Vanc x1 for OR 4/23 - vanc > 4/25  4/23 - merrem-4/28  Interim history/subjective:  NAEO PSV/CPAP with variable patient effort, concerning for CS pattern-- adequate pulm effort followed by apnea, with resumption of adequate effort, in a cyclical fashion.   I have discontinued mero 4/28  Objective   Blood pressure (!) 156/58, pulse 74, temperature 99.8 F (37.7 C), temperature source Axillary, resp. rate (!) 27, height _0  (1.626 m), weight 107.7 kg, SpO2 100 %.    Vent Mode: PRVC FiO2 (%):  [40 %] 40 % Set Rate:  [20 bmp] 20 bmp Vt Set:  [440 mL] 440 mL PEEP:  [5 cmH20] 5 cmH20 Plateau Pressure:  [13 cmH20-15  cmH20] 14 cmH20   Intake/Output Summary (Last 24 hours) at 02/22/2020 0926 Last data filed at 02/22/2020 0800 Gross per 24 hour  Intake 1645 ml  Output 2200 ml  Net -555 ml   Filed Weights   02/16/20 0500 02/17/20 2200 02/22/20 0500  Weight: 112.4 kg 112.4 kg 107.7 kg   General Appearance:  Adult female on vent  HEENT:  ETT OGT secure. Pink mmm. Clear oral  secretions.  Lungs: Scattered faint rhonchi. Symmatrical chest expansion on PSV/CPAP. Periods of apnea Heart:  RRR 2+ radial pulses. Cap refill < 3 seconds  Abdomen:  Obese, soft ndnt  GU: Yellow urine  Extremities: BUE BLE edema, no joint deformity, no cyanosis or clubbing  Skin: pale, c/d/w and without rash Neurologic: Intermittently opens eyes, does not follow command. Plantar flexion to painful stimuli. + cough and gag.   Resolved Hospital Problem list    Possible angioedema 02/15/20  - hard to differentiate between anasarca  - dc home lisinopril being given and stay off R sided PTX, s/p chst tube placement, removed 4/28   Assessment & Plan:   Goals of Care Plan -Transitioning to comfort care 4/29, greatly appreciate palliative care team's ongoing support and assistance  -Timing of extubation TBD, pending arrival of additional family for final goodbyes. Discussed jointly with Palliative Care with multiple family members -Chaplain consult for family support at end of life  -Morphine, PRN versed, secretion management  -no further labs, imaging, etc.   Acute hypoxic respiratory failure with failure to protect airway, concern for aspiration PNA -intubated in ED with failed airway protection after vomiting - s/p reintubation 02/16/20 Plan - Compassionate extubation pending arrival of family - Start morphine and titrate prior to extubation and ensure availability of BZDs due to enlarged tongue, hx difficulty coughing swallowing (per family) and variable resp pattern on PSV/CPAP  -glycopyrrolate   Large ICH with cytotoxic cerebral edema  Possible seizure activity s/p Crani  -s/p craniotomy pm 4/17 s drain -S/p 3% saline ended 02/15/20 Plan  - Level of disability from [place] and support required is significant, and would not be congruent with pt's wishes - transitioning to comfort measures 4/29   Fever -intermittent, despite abx course (see above) and without gross fluctuation in  leukocytosis. Consider neurogenic etiology as culture data unremarkable  Plan -PRN APAP, comfort   Hypokalemia  Hypernatremia, mild  Volume overload Plan -no further labs or volume correction, comfort measures   Atrial Fibrillation with RVR -currently NSR  HTN -echo 4/17 with mod PAS elevation/ mild LVH Plan -transitioning to comfort   Hyperglycemia Plan -no further CBG checks/SSI   Acute blood loss anemia  -in setting of craniotomy 4/17  Plan - no further labs or Hgb goal, comfort measures    Best practice:  Diet: dc, comfort care  Pain/Anxiety/Delirium protocol (if indicated): Morphine gtt, PRN BZD  VAP protocol (if indicated): yes DVT prophylaxis: dc, comfort  GI prophylaxis: protonix  Glucose control: dc, comfort  Mobility: BR Code Status: DNR  Family Communication:  Discussed with husband, daughter, and other family members jointly with Palliative Care team-- will commence comfort measures 4/29 morning, extubate pending final goodbyes by family (awaiting arrival of pt sister)  Disposition: ICU   CRITICAL CARE Performed by: Cristal Generous   Total critical care time: 45 minutes  Critical care time was exclusive of separately billable procedures and treating other patients. Critical care was necessary to treat or prevent imminent or life-threatening deterioration.  Critical care was time  spent personally by me on the following activities: development of treatment plan with patient and/or surrogate as well as nursing, discussions with consultants, evaluation of patient's response to treatment, examination of patient, obtaining history from patient or surrogate, ordering and performing treatments and interventions, ordering and review of laboratory studies, ordering and review of radiographic studies, pulse oximetry and re-evaluation of patient's condition.  Eliseo Gum MSN, AGACNP-BC [place] [phone] If no answer,  [phone] 02/22/2020, 9:26 AM

## 2020-02-22 NOTE — Procedures (Signed)
Extubation Procedure Note  Patient Details:   Name: Bronda Alfred DOB: 05-Mar-1956 MRN: [id number]   Airway Documentation:    Vent end date: 02/22/20 Vent end time: 1132   Evaluation  Patient extubated per MD order to comfort care measures. RN at bedside.   Harmon Dun Aston Lieske 02/22/2020, 11:38 AM

## 2020-02-22 NOTE — Progress Notes (Signed)
Nutrition Brief Note  Chart reviewed. Pt now transitioning to comfort care.  No further nutrition interventions warranted at this time.  Please re-consult as needed.   Lyniah Fujita P., RD, LDN, CNSC See AMiON for contact information     

## 2020-02-22 NOTE — Progress Notes (Addendum)
Daily Progress Note   Patient Name: Meredith Cooper       Date: 02/22/2020 DOB: 1956-08-20  Age: 64 y.o. MRN#: [id number] Attending Physician: Garvin Fila, MD Primary Care Physician: Wayland Denis, PA-C Admit Date: 02/01/2020  Reason for Consultation/Follow-up: Establishing goals of care  Subjective: Eyes open spontaneously but non-purposeful. Does not track. Does not follow commands. Remains intubated.   GOC:  Spoke with daughter and husband this AM. Husband is ready for compassionate extubation today but is waiting on a few more family members prior to extubation.   Discussed transition to comfort focused care, explaining that interventions not aimed at comfort will be discontinued. Discussed initiation of morphine infusion and comfort medications to ensure she does not struggle or suffer. Emphasized focus on comfort and dignity as she nears EOL. Prepared husband for 'anything to happen at any time' once tube is removed, prognosis of minutes-hours-days. Daughter is also prepared for this. Husband understands and agrees with DNR code status in order to allow her a natural death.   Therapeutic listening. Emotional/spiritual support provided.   Multiple family members rotating to visit with Meredith Cooper prior to extubation. Husband will notify RN when they are ready to extubate.   Daughter and husband have PMT contact information.   ADDENDUM 1345: Patient extubated to comfort measures. Appears comfortable during visit. Vitals stable off ventilator but with short periods of apnea. Morphine 33m/hr infusing. Scopolamine TD added. Spoke with husband and daughter about comfort measures, EOL expectations, symptom management medications, and visitation policy. Answered all questions. Emotional/spiritual  support provided.    Length of Stay: 13  Current Medications: Scheduled Meds:  . chlorhexidine gluconate (MEDLINE KIT)  15 mL Mouth Rinse BID  . glycopyrrolate  0.2 mg Intravenous Q4H  . mouth rinse  15 mL Mouth Rinse 10 times per day  . sodium chloride flush  10-40 mL Intracatheter Q12H    Continuous Infusions: . sodium chloride Stopped (02/16/20 0844)  . morphine      PRN Meds: acetaminophen **OR** acetaminophen (TYLENOL) oral liquid 160 mg/5 mL **OR** acetaminophen, albuterol, glycopyrrolate, hydrALAZINE, labetalol, midazolam, morphine, sodium chloride flush  Physical Exam Vitals and nursing note reviewed.  Constitutional:      Interventions: She is sedated and intubated.  HENT:     Head: Normocephalic and atraumatic.  Pulmonary:  Effort: No tachypnea, accessory muscle usage or respiratory distress. She is intubated.     Breath sounds: Normal breath sounds.  Skin:    General: Skin is warm and dry.  Neurological:     Comments: Does not follow commands. Does not track. Responds to painful stimuli. Intermittently opens eyes but with non-purposeful response. + gag/cough            Vital Signs: BP (!) 156/58 (BP Location: Right Arm)   Pulse 74   Temp 99.8 F (37.7 C) (Axillary)   Resp (!) 27   Ht '5\' 4"'  (1.626 m)   Wt 107.7 kg   SpO2 100%   BMI 40.76 kg/m  SpO2: SpO2: 100 % O2 Device: O2 Device: Ventilator O2 Flow Rate:    Intake/output summary:   Intake/Output Summary (Last 24 hours) at 02/22/2020 0931 Last data filed at 02/22/2020 0800 Gross per 24 hour  Intake 1645 ml  Output 2200 ml  Net -555 ml   LBM: Last BM Date: 02/20/20 Baseline Weight: Weight: 109.3 kg Most recent weight: Weight: 107.7 kg       Palliative Assessment/Data: PPS 10%      Patient Active Problem List   Diagnosis Date Noted  . Hemorrhagic stroke ([place])   . Endotracheally intubated   . Palliative care by specialist   . VAP (ventilator-associated pneumonia) ([place])   . Acute  respiratory failure with hypoxemia ([place]) 02/11/2020  . Acute blood loss anemia 02/11/2020  . Stroke ([place]) 02/23/2020  . Obesity (BMI 30-39.9) 12/18/2018  . Post-menopause on HRT (hormone replacement therapy) 12/18/2018  . Essential hypertension 12/18/2018  . Hyperlipidemia 12/18/2018  . Incomplete bladder emptying 12/18/2018  . 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 Rectocele 12/10/2017  . Cystocele, midline 12/10/2017  . Status post hysterectomy 12/09/2016  . Goals of care, counseling/discussion 04/22/2016  . SUI (stress urinary incontinence, female) 04/22/2016    Palliative Care Assessment & Plan   Patient Profile: 64 y.o. female  with past medical history of thyroid disease, hypertension, hyperlipidemia, hiatal hernia, GERD admitted on 01/31/2020 with altered mental status, flaccid right side, and left gaze. Found to have large left frontal hemorrhage, left MCA and bilateral ACA infarcts with large hematoma centered within left frontal lobe likely hemorrhagic conversion s/p decompressive craniectomy. PCCM and neurology following. Poor neurological recovery and poor prognosis. Palliative medicine consultation for goals of care.   Assessment: Large ICH  Acute hypoxic respiratory failure Presumed aspiration pneumonia Spontaneous small right apical pneumothorax Afib with RVR  Recommendations/Plan:  Compassionate extubation to comfort focused care this afternoon when family ready. Waiting on other family members to arrive prior to extubation. Husband to notify RN when ready. Husband understands interventions not aimed at comfort will be discontinued.   Comfort medications initiated  Morphine gtt  RN may bolus morphine via infusion for breakthrough pain/dyspnea/air hunger  Scheduled robinul  Prn robinul for secretions  Prn versed for agitaiton/seizures  Scopolamine TD  Spiritual care consult  Unrestricted visitor access.  Cardiac monitor to comfort screen.  Anticipate hospital death  once patient compassionately extubated to comfort.   Transfer to 6N if bed available. Family aware.   Code Status: DNR   Code Status Orders  (From admission, onward)         Start     Ordered   02/17/20 1143  Limited resuscitation (code)  Continuous    Comments: Pressors ok  Question Answer Comment  In the event of cardiac or respiratory ARREST: Initiate Code Blue, Call Rapid Response Yes  In the event of cardiac or respiratory ARREST: Perform CPR No   In the event of cardiac or respiratory ARREST: Perform Intubation/Mechanical Ventilation Yes   In the event of cardiac or respiratory ARREST: Use NIPPV/BiPAp only if indicated No   In the event of cardiac or respiratory ARREST: Administer ACLS medications if indicated No   In the event of cardiac or respiratory ARREST: Perform Defibrillation or Cardioversion if indicated No      02/17/20 1200        Code Status History    Date Active Date Inactive Code Status Order ID Comments User Context   02/14/2020 1529 02/17/2020 1200 Full Code [id number]  Vonzella Nipple, NP ED   Advance Care Planning Activity       Prognosis:   Poor prognosis: hours-days once extubated  Discharge Planning:  Anticipate hospital death once compassionately extubated to comfort.   Care plan was discussed with RN, Hoover Browns, daughter, husband, family members at bedside  Thank you for allowing the Palliative Medicine Team to assist in the care of this patient.  Total time: 60 minutes             Greater than 50% of this time was spent counseling and coordinating care related to the above assessment and plan.   Ihor Dow, DNP, FNP-C Palliative Medicine Team  Phone: [phone] Fax: [phone]  Please contact Palliative Medicine Team phone at [phone] for questions and concerns.

## 2020-02-22 NOTE — Progress Notes (Signed)
STROKE TEAM PROGRESS NOTE   INTERVAL HISTORY Her husband  is at the bedside. . Her neurological condition remains unchanged.    Family is leaning towards transition to comfort care only and family does not want trach or PEG.  Family plans to say final goodbye this morning .she remains critically ill and is on ventilatory support for respiratory failure.  OBJECTIVE Vitals:   02/22/20 0800 02/22/20 0900 02/22/20 1000 02/22/20 1115  BP: (!) 156/58 (!) 151/68 (!) 146/62 (!) 146/62  Pulse: 74 83 73 88  Resp: (!) 27 (!) 28 (!) 27 (!) 29  Temp: 99.8 F (37.7 C)     TempSrc: Axillary     SpO2: 100% 100% 100% 100%  Weight:      Height:       CBC:  Recent Labs  Lab 02/17/20 0623 02/17/20 0623 02/18/20 0507 02/18/20 0557 02/20/20 0518 02/21/20 0527  WBC 16.1*   < > 14.9*   < > 15.9* 16.2*  NEUTROABS 11.1*  --  10.6*  --   --   --   HGB 7.7*   < > 7.9*   < > 7.5* 7.6*  HCT 27.0*   < > 27.6*   < > 26.0* 25.8*  MCV 88.5   < > 87.9   < > 85.5 85.1  PLT 310   < > 331   < > 331 350   < > = values in this interval not displayed.   Basic Metabolic Panel:  Recent Labs  Lab 02/20/20 0518 02/21/20 0527  NA 150* 148*  K 3.5 4.1  CL 115* 113*  CO2 28 25  GLUCOSE 144* 147*  BUN 33* 34*  CREATININE 0.69 0.78  CALCIUM 8.0* 8.1*  MG 2.3 2.3  PHOS 3.3 3.7    IMAGING DG CHEST PORT 1 VIEW  Result Date: 02/21/2020 CLINICAL DATA:  Post removal of RIGHT thoracostomy tube EXAM: PORTABLE CHEST 1 VIEW COMPARISON:  Portable exam 1808 hours compared to 0536 hours FINDINGS: Interval removal of RIGHT jugular line and RIGHT thoracostomy tube. Tip of endotracheal tube projects 5.7 cm above carina. Feeding tube extends into abdomen. Rotated to the RIGHT. Stable heart size and mediastinal contours. Mild RIGHT upper lobe infiltrate. Remaining lungs clear. No pleural effusion or pneumothorax. IMPRESSION: No pneumothorax following RIGHT thoracostomy tube removal. Mild RIGHT upper lobe infiltrate.  Electronically Signed   By: Lavonia Dana M.D.   On: 02/21/2020 19:57     PHYSICAL EXAM    Middle-aged Caucasian lady who is intubated and on ventilatory support.  Afebrile. Head is nontraumatic. Neck is supple without bruit.    Cardiac exam no murmur or gallop. Lungs are clear to auscultation. Distal pulses are well felt. Neurological Exam :  - intubated on Precedex and as needed fentanyl, eyes closed, not open on voice, not following commands. With eyes forced opening, eyes in primary position with left exotropia, not blinking to visual threat, doll's eyes sluggish, not tracking, pupil equal but sluggish to light. Corneal reflex weak bilaterally, gag and cough reflex present. Breathing over the vent.  Facial symmetry not able to test due to ET tube.  Tongue protrusion not cooperative. On pain stimulation, left UE slight withdraw but no movement on other limbs. DTR 1+ and bilateral positive babinski. Sensation, coordination and gait not tested.   ASSESSMENT/PLAN Ms. Latha Staunton is a 64 y.o. female with history of Htn, Hld and estrogen therapy presenting with left gaze and right side flaccid. Vomited and was intubated  She did not receive IV t-PA due to [place].  Stroke:  left MCA and bilateral ACA infarcts with large hematoma centered within the left frontal lobe likely due to hemorrhagic conversion s/p crani for hematoma evacuation - stroke etiology likely due to PAF  CT Head 01/30/2020 - Very large acute parenchymal hemorrhage centered within the left frontal lobe measuring 8.2 x 6.5 x 5.6 cm. 5 mm rightward midline shift at the level of the septum pellucidum.   CT head - 02/22/2020 - Unchanged size of massive intraparenchymal hematoma centered in the left frontal lobe.  MRI head - unchanged hematoma and surrounding edema. DWI signal at left MCA and ACA consistent with acute infarcts. Scattered punctate acute infarcts in both cerebral hemispheres suggesting emboli  CTA H&N - No large vessel occlusion or  high-grade arterial stenosis. No evidence of vascular malformation on the current exam.  CT repeat 4/19 - significantly decreased hematoma post evacuation  CT repeat 4/21 - stable residual hematoma and 4 mm of midline shift  CT Head 4/24 - Stable appearance status post evacuation of large left superior hemisphere intra-axial hematoma, aside from decreased pneumocephalus.  2D Echo - EF 65-70%   [place] Corona Virus 2 - negative - Ab positive  LDL - 74  HgbA1c - 5.6  UDS - negative  VTE prophylaxis - heparin subq  No antithrombotic prior to admission, now on No antithrombotic due to hemorrhage  Therapy recommendations:  Re-order once pt stable for evaluation  Disposition:  Pending  Palliative care consulted to assist dtr and family with goals of care. Met with family. Continue current care to Thursday. They are leaning against trach/PEG.      Acute Respiratory Failure  Intubated for airway protection  CCM on board  Bronch w/ ET repositioning, Cx  No extubation given worsening hypoxemia, infiltrated, increasing WBC (?PNA)  Back on propofol this am  Aspiration Pneumonia Leukocytosis Fever  Pneumothorax, RIght  Nausea vomiting on presentation  Cefepime 4/16 >>4/22  CCM on board  Temp - 102.4  WBCs - 15.9  CXR 4/26 - No visible PNX. Stable patchy B airspace opacities  UA neg  Resp Cx 4/18 normal flora  Blood Cx 4/20 NGTD    Resp Cx 4/21 no growth  Resp Cx 4/23 normal  Vancomycin 4/23>>4/25  Meropenem 4/23>>  Cerebral edema  5 mm Rightward Midline Shift on CT and MRI  NSG on board s/p hematoma evacuation 4/17   CT 4/21 stable residual hematoma and midline shift  23.4% saline x1  Treated with 3%, now off   Na - 150   On free water 100cc Q4h    Atrial Fibrillation w/ RVR  New diagnosis  Given her embolic strokes on MRI, pt likely has PAF before admission  Now back to NSR  Amiodarone discontinued  On cardizem IV - transitioned to po  cardizem  Cardiology consulted - appreciate help  May consider AC once blood absorbed in the future (likely months before being able to add)  UE shivering/shaking  Bilateral versus unilateral arm shaking/shivering  LTM EEG no seizure to correlate the shaking -> discontinued 4/19  Keppra - 500 mg Q 12 hrs after Keppra 2 g load  Seizure precautions  Hypertension  Home BP meds: Zestril 10 mg daily  off Cleviprex and normodyne (infusion)  On cardizem drip->po  Added lisinopril 60m bid -> off d/t eyelid swelling . SBP goal < 160 mm Hg  . Stable  Hyperlipidemia  Home Lipid lowering medication: Pravachol 20 mg  daily  LDL 74, goal < 70  Current lipid lowering medication: None for now  May resume statin at discharge vs. SATURN trial  Dysphagia At risk malnutrition   N.p.o.  On tube feeding at 45cc/hr  Free water 1000 q2h  Speech following  Dietitian on board  Anemia, acute blood loss  Hb - 7.5  Likely due to post op  S/p PRBC in OR  Iron 16 and ferritin 66  Iron pills added  CBC monitoring  Other Stroke Risk Factors  Advanced age  Morbid Obesity, Body mass index is 40.76 kg/m., recommend weight loss, diet and exercise as appropriate   On estrogen daily po PTA  Other Active Problems  Code status - Limited Code  Hypokalemia - treated - K 3.5  Hypophosphatemia - treated - Phos 3.3  Family requested COVID Ab testing after COVID vaccine - serology reactive  Right eyelid swollen - d/c lisinopril   Hospital day # 13 Patient's neurological condition remains poor without meaningful improvement despite hemicraniectomy from her large intracerebral hemorrhage.  Family understands poor prognosis and now have agreed to one-way extubation , DNR and comfort care after seeing final goodbye this morning I am in agreement with the plan.  Discussed with patient's husband and sister at the bedside and critical care and palliative care team  This patient is  critically ill and at significant risk of neurological worsening, death and care requires constant monitoring of vital signs, hemodynamics,respiratory and cardiac monitoring, extensive review of multiple databases, frequent neurological assessment, discussion with family, other specialists and medical decision making of high complexity.I have made any additions or clarifications directly to the above note.This critical care time does not reflect procedure time, or teaching time or supervisory time of PA/NP/Med Resident etc but could involve care discussion time. I spent 30 minutes of neurocritical care time  in the care of  this patient.    Antony Contras, MD  To contact Stroke Continuity provider, please refer to [email or web address] After hours, contact General Neurology

## 2020-02-22 NOTE — Progress Notes (Signed)
   02/22/20 0930  Clinical Encounter Type  Visited With Patient and family together  Visit Type Critical Care  Referral From Nurse  Consult/Referral To Chaplain  Spiritual Encounters  Spiritual Needs Emotional;Grief support  Stress Factors  Patient Stress Factors Loss  Family Stress Factors Major life changes   Chaplain responded to consult for family support at end of life. Meredith Cooper's husband and daughter were present as well as several other extended members of the family. Chaplain offered 3 hours of active listening, ministry of presence, information relay, and emotional/grief support via family stories/memories recall and sharing via family members. Chaplain offered prayer and remains available for support as needs arise.   Chaplain Resident, Amado Coe, M Div [phone] new on-call pager

## 2020-02-23 ENCOUNTER — Telehealth (HOSPITAL_COMMUNITY): Payer: Self-pay | Admitting: Radiology

## 2020-02-23 DIAGNOSIS — Z515 Encounter for palliative care: Secondary | ICD-10-CM | POA: Diagnosis not present

## 2020-02-23 DIAGNOSIS — I619 Nontraumatic intracerebral hemorrhage, unspecified: Secondary | ICD-10-CM | POA: Diagnosis not present

## 2020-02-23 DIAGNOSIS — J9601 Acute respiratory failure with hypoxia: Secondary | ICD-10-CM | POA: Diagnosis not present

## 2020-02-23 NOTE — Social Work (Signed)
Request for referral to Hospice Home in [facility]) has been received. CSW has made referral to [place] with [employer].   Will also leave hand off for weekend CSW staff to review referral and f/u with [facility] staff.    Meredith Cooper, MSW, LCSW [facility] Clinical Social Work

## 2020-02-23 NOTE — Plan of Care (Signed)
  Problem: Clinical Measurements: Goal: Quality of life will improve Outcome: Progressing   Problem: Role Relationship: Goal: Family's ability to cope with current situation will improve Outcome: Progressing Goal: Ability to verbalize concerns, feelings, and thoughts to partner or family member will improve Outcome: Progressing   Problem: Pain Management: Goal: Satisfaction with pain management regimen will improve Outcome: Progressing   

## 2020-02-23 NOTE — Progress Notes (Signed)
STROKE TEAM PROGRESS NOTE   INTERVAL HISTORY Her husband  is at the bedside.  Patient was made full comfort care measures only and transferred to hospice medical floor bed.. Her neurological condition remains unchanged.  She is sedated on morphine drip and is resting peacefully.  Vital signs seem stable OBJECTIVE Vitals:   02/22/20 1000 02/22/20 1115 02/22/20 1555 02/23/20 0630  BP: (!) 146/62 (!) 146/62 (!) 140/59 126/60  Pulse: 73 88 73 65  Resp: (!) 27 (!) 29 (!) 22 15  Temp:   99.6 F (37.6 C) 98.5 F (36.9 C)  TempSrc:   Oral Axillary  SpO2: 100% 100% 93% 94%  Weight:      Height:       CBC:  Recent Labs  Lab 02/17/20 0623 02/17/20 0623 02/18/20 0507 02/18/20 0557 02/20/20 0518 02/21/20 0527  WBC 16.1*   < > 14.9*   < > 15.9* 16.2*  NEUTROABS 11.1*  --  10.6*  --   --   --   HGB 7.7*   < > 7.9*   < > 7.5* 7.6*  HCT 27.0*   < > 27.6*   < > 26.0* 25.8*  MCV 88.5   < > 87.9   < > 85.5 85.1  PLT 310   < > 331   < > 331 350   < > = values in this interval not displayed.   Basic Metabolic Panel:  Recent Labs  Lab 02/20/20 0518 02/21/20 0527  NA 150* 148*  K 3.5 4.1  CL 115* 113*  CO2 28 25  GLUCOSE 144* 147*  BUN 33* 34*  CREATININE 0.69 0.78  CALCIUM 8.0* 8.1*  MG 2.3 2.3  PHOS 3.3 3.7    IMAGING No results found.   PHYSICAL EXAM    Middle-aged Caucasian lady who is sedated on morphine drip.  Afebrile. Head is nontraumatic. Neck is supple without bruit.    Cardiac exam no murmur or gallop. Lungs are clear to auscultation. Distal pulses are well felt. Neurological Exam :  -Sedated on morphine drip eyes closed, not open on voice, not following commands. With eyes forced opening, eyes in primary position with left exotropia, not blinking to visual threat, doll's eyes sluggish, not tracking, pupil equal but sluggish to light.  .   On pain stimulation, left UE slight withdraw but no movement on other limbs. DTR 1+ and bilateral positive babinski. Sensation,  coordination and gait not tested.   ASSESSMENT/PLAN Ms. Keelin Neville is a 64 y.o. female with history of Htn, Hld and estrogen therapy presenting with left gaze and right side flaccid. Vomited and was intubated  She did not receive IV t-PA due to [place].  Stroke:  left MCA and bilateral ACA infarcts with large hematoma centered within the left frontal lobe likely due to hemorrhagic conversion s/p crani for hematoma evacuation - stroke etiology likely due to PAF  CT Head 02/02/2020 - Very large acute parenchymal hemorrhage centered within the left frontal lobe measuring 8.2 x 6.5 x 5.6 cm. 5 mm rightward midline shift at the level of the septum pellucidum.   CT head - 02/14/2020 - Unchanged size of massive intraparenchymal hematoma centered in the left frontal lobe.  MRI head - unchanged hematoma and surrounding edema. DWI signal at left MCA and ACA consistent with acute infarcts. Scattered punctate acute infarcts in both cerebral hemispheres suggesting emboli  CTA H&N - No large vessel occlusion or high-grade arterial stenosis. No evidence of vascular malformation on the  current exam.  CT repeat 4/19 - significantly decreased hematoma post evacuation  CT repeat 4/21 - stable residual hematoma and 4 mm of midline shift  CT Head 4/24 - Stable appearance status post evacuation of large left superior hemisphere intra-axial hematoma, aside from decreased pneumocephalus.  2D Echo - EF 65-70%   Sars Corona Virus 2 - negative - Ab positive  LDL - 74  HgbA1c - 5.6  UDS - negative  VTE prophylaxis - heparin subq  No antithrombotic prior to admission, now on No antithrombotic due to hemorrhage  Therapy recommendations:  Re-order once pt stable for evaluation  Disposition:  Pending  Palliative care consulted to assist dtr and family with goals of care. Met with family. Continue current care to Thursday. They are leaning against trach/PEG.      Acute Respiratory Failure  Intubated for airway  protection  CCM on board  Bronch w/ ET repositioning, Cx  No extubation given worsening hypoxemia, infiltrated, increasing WBC (?PNA)  Back on propofol this am  Aspiration Pneumonia Leukocytosis Fever  Pneumothorax, RIght  Nausea vomiting on presentation  Cefepime 4/16 >>4/22  CCM on board  Temp - 102.4  WBCs - 15.9  CXR 4/26 - No visible PNX. Stable patchy B airspace opacities  UA neg  Resp Cx 4/18 normal flora  Blood Cx 4/20 NGTD    Resp Cx 4/21 no growth  Resp Cx 4/23 normal  Vancomycin 4/23>>4/25  Meropenem 4/23>>  Cerebral edema  5 mm Rightward Midline Shift on CT and MRI  NSG on board s/p hematoma evacuation 4/17   CT 4/21 stable residual hematoma and midline shift  23.4% saline x1  Treated with 3%, now off   Na - 150   On free water 100cc Q4h    Atrial Fibrillation w/ RVR  New diagnosis  Given her embolic strokes on MRI, pt likely has PAF before admission  Now back to NSR  Amiodarone discontinued  On cardizem IV - transitioned to po cardizem  Cardiology consulted - appreciate help  May consider AC once blood absorbed in the future (likely months before being able to add)  UE shivering/shaking  Bilateral versus unilateral arm shaking/shivering  LTM EEG no seizure to correlate the shaking -> discontinued 4/19  Keppra - 500 mg Q 12 hrs after Keppra 2 g load  Seizure precautions  Hypertension  Home BP meds: Zestril 10 mg daily  off Cleviprex and normodyne (infusion)  On cardizem drip->po  Added lisinopril 17m bid -> off d/t eyelid swelling . SBP goal < 160 mm Hg  . Stable  Hyperlipidemia  Home Lipid lowering medication: Pravachol 20 mg daily  LDL 74, goal < 70  Current lipid lowering medication: None for now  May resume statin at discharge vs. SATURN trial  Dysphagia At risk malnutrition   N.p.o.  On tube feeding at 45cc/hr  Free water 1000 q2h  Speech following  Dietitian on board  Anemia, acute  blood loss  Hb - 7.5  Likely due to post op  S/p PRBC in OR  Iron 16 and ferritin 66  Iron pills added  CBC monitoring  Other Stroke Risk Factors  Advanced age  Morbid Obesity, Body mass index is 40.76 kg/m., recommend weight loss, diet and exercise as appropriate   On estrogen daily po PTA  Other Active Problems  Code status - Limited Code  Hypokalemia - treated - K 3.5  Hypophosphatemia - treated - Phos 3.3  Family requested COVID Ab testing after COVID  vaccine - serology reactive  Right eyelid swollen - d/c lisinopril   Hospital day # 14 Patient's neurological condition remains poor without meaningful improvement despite hemicraniectomy from her large intracerebral hemorrhage.  Family has made her full comfort care measures only.  She is resting comfortably on morphine drip.  I had a long discussion with the patient's husband at the bedside and answered questions.  He would like [place] to explore possibility of transfer to hospice nursing facility and [facility] where a lot of her family lives.  I will asked social worker to look into this   Antony Contras, MD  To contact Stroke Continuity provider, please refer to [email or web address] After hours, contact General Neurology

## 2020-02-23 NOTE — Progress Notes (Signed)
Civil engineer, contracting [facility]) Hospital Liaison note.     Received request from Juanda Chance manager for family interest in [facility]. [facility] is unable to offer a room today. Hospital Liaison will follow up tomorrow or sooner if a room becomes available.     A Please do not hesitate to call with questions.     Thank you,    Elsie Saas, RN, [facility]       [facility] Liaison (listed on AMION under Hospice [facility])     [phone]

## 2020-02-23 NOTE — Progress Notes (Signed)
PCCM Brief Note  Chart Reviewed, patient transitioned to comfort care yesterday, 02/22/20, with compassionate 1-way extubation and was subsequently transferred out of ICU.  PCCM will sign off at this time.   Please engage if further support can be provided.    Tessie Fass MSN, AGACNP-BC [place] [facility] [phone] If no answer, [phone] 02/23/2020, 7:55 AM

## 2020-02-23 NOTE — Progress Notes (Signed)
Daily Progress Note   Patient Name: Meredith Cooper       Date: 02/23/2020 DOB: [birth year]  Age: 64 y.o. MRN#: [id number] Attending Physician: Garvin Fila, MD Primary Care Physician: Wayland Denis, PA-C Admit Date: 02/20/2020  Reason for Consultation/Follow-up: Establishing goals of care, Non pain symptom management, Pain control and Psychosocial/spiritual support     Subjective:  Sat at bedside and spoke with Joe.  We discussed family who are concerned patient may recover.  Joe feels it is in [employer].  He talked with me about his family and particularly his daughter Kenney Houseman who is his Godsend.  Joe and I discussed Hospice facility.  He feels she would be well served there.  Asked by Palliative RN to call patient's daughter Kenney Houseman.  Tonya and I chatted for about 20 minutes about the stroke, what caused it (atrial fibrillation), and whether or not her mother could get better.  I gently told Kenney Houseman that I have never seen some one get better after a stroke like the one her mother had.  Kenney Houseman stated that that was exactly what she needed to hear.  I explained even if her mother somehow remained alive she would never eat, speak, or get out of bed again.     I expressed to Kenney Houseman that her mother is young and her dying process could go on for days to possibly over a week.  Kenney Houseman understood and wants to ensure her mother is comfortable.    Like her father, Kenney Houseman expressed that multiple family members are upset with her and feel she is giving up on her mother.  I expressed to Kenney Houseman that there is no "giving up" there is only acceptance and moving forward.  Kenney Houseman feels responsible as she and her father made the decision for surgery and made the decision from compassionate extubation.  I explained to [place] that  she is not responsible for her mother's living or dying.  Her mother had a massive stroke that would have immediately taken her life if there had been no intervention.    I discussed Hospice facility with Tonya.  She feels it is appropriate for her mother to be discharged to Hospice and chooses the [place] location if possible.    Assessment: Patient dying after massive stroke and compassionate extubation.  She is symptom controlled  on morphine gtt and is currently stable for transport to hospice house.  She is tachycardic (120s) so I will increase the morphine gtt to improve comfort level.  Otherwise she appears comfortable.     Patient Profile/HPI:  64 y.o. female  with past medical history of thyroid disease, hypertension, hyperlipidemia, hiatal hernia, GERD admitted on 02/19/2020 with altered mental status, flaccid right side, and left gaze. Found to have large left frontal hemorrhage, left MCA and bilateral ACA infarcts with large hematoma centered within left frontal lobe likely hemorrhagic conversion s/p decompressive craniectomy. PCCM and neurology following. Poor neurological recovery and poor prognosis. Palliative medicine consultation for goals of care.      Length of Stay: 14   Vital Signs: BP 126/60 (BP Location: Left Arm)   Pulse 65   Temp 98.5 F (36.9 C) (Axillary)   Resp 15   Ht 5\' 4"  (1.626 m)   Wt 107.7 kg   SpO2 94%   BMI 40.76 kg/m  SpO2: SpO2: 94 % O2 Device: O2 Device: Room Air O2 Flow Rate:         Palliative Assessment/Data: 10%     Palliative Care Plan    Recommendations/Plan:  Increase morphine gtt to 3 ml/hour as patient is significantly tachycardic.  TOC order in for Hospice Facility in [place].  TOC referral made to [facility].  I spoke with [facility].  PMT will continue to round on the patient and family for symptom management and questions while she is in house.  Code Status:  DNR  Prognosis:   < 2 weeks   Discharge  Planning:  Hospice facility  Care plan was discussed with Hospice, Family  Thank you for allowing the Palliative Medicine Team to assist in the care of this patient.  Total time spent:  65 min.     Greater than 50%  of this time was spent counseling and coordinating care related to the above assessment and plan.  Elsie Saas, PA-C Palliative Medicine  Please contact Palliative MedicineTeam phone at [phone] for questions and concerns between 7 am - 7 pm.   Please see AMION for individual provider pager numbers.

## 2020-02-23 NOTE — Telephone Encounter (Signed)
error 

## 2020-02-24 DIAGNOSIS — Z515 Encounter for palliative care: Secondary | ICD-10-CM | POA: Diagnosis not present

## 2020-02-24 DIAGNOSIS — I619 Nontraumatic intracerebral hemorrhage, unspecified: Secondary | ICD-10-CM | POA: Diagnosis not present

## 2020-02-24 DIAGNOSIS — J9601 Acute respiratory failure with hypoxia: Secondary | ICD-10-CM | POA: Diagnosis not present

## 2020-02-24 DIAGNOSIS — D62 Acute posthemorrhagic anemia: Secondary | ICD-10-CM | POA: Diagnosis not present

## 2020-02-24 DIAGNOSIS — R509 Fever, unspecified: Secondary | ICD-10-CM | POA: Diagnosis not present

## 2020-02-24 MED ORDER — WHITE PETROLATUM EX OINT
TOPICAL_OINTMENT | CUTANEOUS | Status: AC
  Administered 2020-02-24: 0.2
  Filled 2020-02-24: qty 28.35

## 2020-02-24 MED ORDER — KETOROLAC TROMETHAMINE 30 MG/ML IJ SOLN
30.0000 mg | Freq: Four times a day (QID) | INTRAMUSCULAR | Status: DC | PRN
  Administered 2020-02-24: 30 mg via INTRAVENOUS
  Filled 2020-02-24: qty 1

## 2020-02-24 MED ORDER — FUROSEMIDE 10 MG/ML IJ SOLN
INTRAMUSCULAR | Status: AC
  Filled 2020-02-24: qty 2

## 2020-02-24 MED ORDER — FUROSEMIDE 10 MG/ML IJ SOLN
40.0000 mg | Freq: Once | INTRAMUSCULAR | Status: AC
  Administered 2020-02-24: 40 mg via INTRAVENOUS
  Filled 2020-02-24: qty 4

## 2020-02-24 NOTE — Progress Notes (Signed)
Daily Progress Note   Patient Name: Meredith Cooper       Date: 02/24/2020 DOB: 18-Aug-1956  Age: 64 y.o. MRN#: [id number] Attending Physician: Rosalin Hawking, MD Primary Care Physician: Wayland Denis, PA-C Admit Date: 01/25/2020  Reason for Consultation/Follow-up: To discuss complex medical decision making related to patient's goals of care  Subjective: Spoke with Husband, brother, and sister at bedside.  Discussed patient's symptoms and presentation.  She had a fever of 102 this morning.  Discussed with family that it may the final surge of her system.  Sister concerned about swelling of extremities.  Patient still has urine output so we can try lasix to help.   Assured the family that patient is comfortable and does not seem bothered by the swelling.  Afterward spoke with Dtr Meredith Cooper and her boyfriend in the hallway.  Answered questions about atrial fibrillation and patient's comfort.  Discussed prognosis and time frames based on clinical picture.   Assessment: Patient nearing EOL.  Showing signs of progression today - fever, decreased urine output, labored breathing overnight resulting in an slight increase to morphine gtt (now on 4 ml/hour).   Patient Profile/HPI:  64 y.o. female  with past medical history of thyroid disease, hypertension, hyperlipidemia, hiatal hernia, GERD admitted on 01/29/2020 with altered mental status, flaccid right side, and left gaze. Found to have large left frontal hemorrhage, left MCA and bilateral ACA infarcts with large hematoma centered within left frontal lobe likely hemorrhagic conversion s/p decompressive craniectomy. PCCM and neurology following. Poor neurological recovery and poor prognosis. Palliative medicine consultation for goals of care.   Length of Stay:  15   Vital Signs: BP (!) 118/43 (BP Location: Right Arm)   Pulse (!) 103   Temp (!) 102.8 F (39.3 C) (Oral)   Resp 17   Ht 5\' 4"  (1.626 m)   Wt 107.7 kg   SpO2 (!) 71%   BMI 40.76 kg/m  SpO2: SpO2: (!) 71 % O2 Device: O2 Device: Room Air O2 Flow Rate:         Palliative Assessment/Data: 10%     Palliative Care Plan    Recommendations/Plan:  Remains OK for transport to [place].   Lasix 40 mg IV x 1  Vaseline to lips.  Provided education and emotional support to family.  Code Status:  DNR  Prognosis:   Hours - Days   Discharge Planning:  Hospice facility  Care plan was discussed with RN, family.  Thank you for allowing the Palliative Medicine Team to assist in the care of this patient.  Total time spent:   35 min.     Greater than 50%  of this time was spent counseling and coordinating care related to the above assessment and plan.  Norvel Richards, PA-C Palliative Medicine  Please contact Palliative MedicineTeam phone at [phone] for questions and concerns between 7 am - 7 pm.   Please see AMION for individual provider pager numbers.     Marland Kitchen

## 2020-02-24 NOTE — Progress Notes (Signed)
STROKE TEAM PROGRESS NOTE   INTERVAL HISTORY Her husband  is at the bedside. I also met daughter in the hallway. Pt lying in bed, unresponsive, not in distress. Still on morphine drip. She had fever this am, on tylenol PRN.   OBJECTIVE Vitals:   02/22/20 1115 02/22/20 1555 02/23/20 0630 02/24/20 0518  BP: (!) 146/62 (!) 140/59 126/60 (!) 118/43  Pulse: 88 73 65 (!) 103  Resp: (!) 29 (!) '22 15 17  ' Temp:  99.6 F (37.6 C) 98.5 F (36.9 C) (!) 102.8 F (39.3 C)  TempSrc:  Oral Axillary Oral  SpO2: 100% 93% 94% (!) 71%  Weight:      Height:       CBC:  Recent Labs  Lab 02/18/20 0507 02/18/20 0557 02/20/20 0518 02/21/20 0527  WBC 14.9*   < > 15.9* 16.2*  NEUTROABS 10.6*  --   --   --   HGB 7.9*   < > 7.5* 7.6*  HCT 27.6*   < > 26.0* 25.8*  MCV 87.9   < > 85.5 85.1  PLT 331   < > 331 350   < > = values in this interval not displayed.   Basic Metabolic Panel:  Recent Labs  Lab 02/20/20 0518 02/21/20 0527  NA 150* 148*  K 3.5 4.1  CL 115* 113*  CO2 28 25  GLUCOSE 144* 147*  BUN 33* 34*  CREATININE 0.69 0.78  CALCIUM 8.0* 8.1*  MG 2.3 2.3  PHOS 3.3 3.7    IMAGING No results found.   PHYSICAL EXAM    Limited neuro exam due to comfort care. Pt lying in bed, not in distress. No spontaneous movement. Eyes closed, not open on voice. Tongue mildly protrusion with left deviation due to gravity. Facial no significant asymmetry.    ASSESSMENT/PLAN [name] is a 64 y.o. female with history of Htn, Hld and estrogen therapy presenting with left gaze and right side flaccid. Vomited and was intubated  She did not receive IV t-PA due to [place].  Stroke:  left MCA and bilateral ACA infarcts with large hematoma centered within the left frontal lobe likely due to hemorrhagic conversion s/p crani for hematoma evacuation - stroke etiology likely due to PAF  CT Head 01/25/2020 - Very large acute parenchymal hemorrhage centered within the left frontal lobe measuring 8.2 x 6.5 x 5.6  cm. 5 mm rightward midline shift at the level of the septum pellucidum.   CT head - 02/03/2020 - Unchanged size of massive intraparenchymal hematoma centered in the left frontal lobe.  MRI head - unchanged hematoma and surrounding edema. DWI signal at left MCA and ACA consistent with acute infarcts. Scattered punctate acute infarcts in both cerebral hemispheres suggesting emboli  CTA H&N - No large vessel occlusion or high-grade arterial stenosis. No evidence of vascular malformation on the current exam.  CT repeat 4/19 - significantly decreased hematoma post evacuation  CT repeat 4/21 - stable residual hematoma and 4 mm of midline shift  CT Head 4/24 - Stable appearance status post evacuation of large left superior hemisphere intra-axial hematoma, aside from decreased pneumocephalus.  2D Echo - EF 65-70%   Sars Corona Virus 2 - negative - Ab positive  LDL - 74  HgbA1c - 5.6  UDS - negative  VTE prophylaxis - heparin subq  No antithrombotic prior to admission, now on No antithrombotic due to hemorrhage  Palliative care consulted to assist dtr and family with goals of care. Now on  full comfort are    Acute Respiratory Failure  Intubated for airway protection  CCM on board  Bronch w/ ET repositioning, Cx  No extubation candidate - one way extubation - comfort care now  Aspiration Pneumonia Leukocytosis Fever  Pneumothorax, RIght  Nausea vomiting on presentation  Cefepime 4/16 >>4/22  CCM on board  Temp - 102.4->102.8  WBCs - 15.9->16.2  CXR 4/26 - No visible PNX. Stable patchy B airspace opacities  UA neg  Resp Cx 4/18 normal flora  Blood Cx 4/20 NGTD    Resp Cx 4/21 no growth  Resp Cx 4/23 normal  Vancomycin 4/23>>4/25-> off  Meropenem 4/23>> off  Cerebral edema  5 mm Rightward Midline Shift on CT and MRI  NSG on board s/p hematoma evacuation 4/17   CT 4/21 stable residual hematoma and midline shift  23.4% saline x1  Treated with 3%, now  off   Na - 150->148  Atrial Fibrillation w/ RVR  New diagnosis  Given her embolic strokes on MRI, pt likely has PAF before admission  Now back to NSR  Amiodarone discontinued  On cardizem IV - transitioned to po cardizem - now off due to comfort care  Cardiology consulted - appreciate help  UE shivering/shaking, resolved  Bilateral versus unilateral arm shaking/shivering  LTM EEG no seizure to correlate the shaking -> discontinued 4/19  Keppra - 500 mg Q 12 hrs after Keppra 2 g load - now off  Seizure precautions  Hypertension  Home BP meds: Zestril 10 mg daily  off Cleviprex and normodyne (infusion)  On cardizem drip->po - now off for comfort care  Added lisinopril 33m bid -> off d/t eyelid swelling  Hyperlipidemia  Home Lipid lowering medication: Pravachol 20 mg daily  LDL 74, goal < 70  Current lipid lowering medication: None for now  Dysphagia  N.p.o.  On tube feeding at 45cc/hr - now off  Free water 1000 q2h - now off  Anemia, acute blood loss  Hb - 7.5->7.6  Likely due to post op  S/p PRBC in OR  Iron 16 and ferritin 66  Other Stroke Risk Factors  Advanced age  Morbid Obesity, Body mass index is 40.76 kg/m., recommend weight loss, diet and exercise as appropriate   On estrogen daily po PTA   Other Active Problems  Code status - DNR  Appreciate Palliative Care's assistance - Compassionate extubation to comfort focused care performed 02/23/20  Hospital day # 15  I had long discussion with husband at bedside and answered all the questions. He expressed appreciation.   JRosalin Hawking MD PhD Stroke Neurology 02/24/2020 10:14 AM       To contact Stroke Continuity provider, please refer to [email or web address] After hours, contact General Neurology

## 2020-02-24 NOTE — TOC Progression Note (Signed)
Transition of Care [facility]) - Progression Note    Patient Details  Name: Meredith Cooper MRN: [id number] Date of Birth: 05/10/1956  Transition of [facility]) CM/SW Contact  Gildardo Griffes, [place] Phone Number: 02/24/2020, 9:59 AM  Clinical Narrative:      CSW reached out to Chales Abrahams with hospice who reports [facility] has no beds available today but she will let CSW know if anything changes.   TOC team continues to follow for discharge planning, pending [facility] bed at this time.      Expected Discharge Plan and Services                                                 Social Determinants of Health (SDOH) Interventions    Readmission Risk Interventions No flowsheet data found.

## 2020-02-24 DEATH — deceased

## 2020-02-25 DIAGNOSIS — I6340 Cerebral infarction due to embolism of unspecified cerebral artery: Secondary | ICD-10-CM | POA: Diagnosis not present

## 2020-02-25 DIAGNOSIS — I619 Nontraumatic intracerebral hemorrhage, unspecified: Secondary | ICD-10-CM | POA: Diagnosis not present

## 2020-02-25 DIAGNOSIS — J9601 Acute respiratory failure with hypoxia: Secondary | ICD-10-CM | POA: Diagnosis not present

## 2020-02-25 DIAGNOSIS — I6389 Other cerebral infarction: Secondary | ICD-10-CM | POA: Diagnosis not present

## 2020-02-25 DIAGNOSIS — G936 Cerebral edema: Secondary | ICD-10-CM | POA: Diagnosis not present

## 2020-02-25 DIAGNOSIS — Z515 Encounter for palliative care: Secondary | ICD-10-CM | POA: Diagnosis not present

## 2020-02-25 NOTE — Progress Notes (Signed)
Daily Progress Note   Patient Name: Meredith Cooper       Date: 02/25/2020 DOB: [birth year]  Age: 64 y.o. MRN#: [id number] Attending Physician: Marvel Plan, MD Primary Care Physician: Carren Rang, PA-C Admit Date: 02/12/2020  Reason for Consultation/Follow-up: To discuss complex medical decision making related to patient's goals of care  Subjective: Patient non-verbal. Husband and bedside. We discussed changes in her breathing (cheyne stokes), but she still appears very comfortable.  Arms and legs still with 2-3+ edema unchanged by a dose of IV lasix yesterday.  Heart rate is normal without irregularity. Also discussed with daughter on the phone.    Daughter expresses concern that she should have taken first available hospice bed instead of waiting for [facility].  I attempted to provide reassurance.   Assessment: Patient actively dying.  Appears comfortable on morphine gtt @ 4 ml/hour.     Patient Profile/HPI:  64 y.o. female  with past medical history of thyroid disease, hypertension, hyperlipidemia, hiatal hernia, GERD admitted on 02/08/2020 with altered mental status, flaccid right side, and left gaze. Found to have large left frontal hemorrhage, left MCA and bilateral ACA infarcts with large hematoma centered within left frontal lobe likely hemorrhagic conversion s/p decompressive craniectomy. PCCM and neurology following. Poor neurological recovery and poor prognosis.     Length of Stay: 16   Vital Signs: BP (!) 143/50 (BP Location: Left Arm)   Pulse 82   Temp (!) 100.4 F (38 C) (Oral)   Resp (!) 22   Ht 5\' 4"  (1.626 m)   Wt 107.7 kg   SpO2 (!) 78%   BMI 40.76 kg/m  SpO2: SpO2: (!) 78 % O2 Device: O2 Device: Room Air O2 Flow Rate:         Palliative Assessment/Data:  10%    Palliative Care Plan    Recommendations/Plan:  Comfortable on morphine gtt at 4 ml/hour today.    Cheyne stokes breathing - but still comfortable.  Will assess patient early morning tomorrow.  I'm a little concerned that if she changes significantly in the next 24 hours she may become to fragile to transport and would die in the hospital.  Code Status:  DNR  Prognosis:   Hours - Days   Discharge Planning:  Anticipated Hospital Death vs Hospice Facility.  Care plan was discussed with  family and RN  Thank you for allowing the Palliative Medicine Team to assist in the care of this patient.  Total time spent:  25 min.     Greater than 50%  of this time was spent counseling and coordinating care related to the above assessment and plan.  Florentina Jenny, PA-C Palliative Medicine  Please contact Palliative MedicineTeam phone at [phone] for questions and concerns between 7 am - 7 pm.   Please see AMION for individual provider pager numbers.

## 2020-02-25 NOTE — Progress Notes (Signed)
Manufacturing systems engineer [facility]) Hospital Liaison note.     Received request from [facility] manager for family interest in [facility]. Chart reviewed and eligibility confirmed. Spoke with family to confirm interest and explain services.  [facility] can accept patient on Monday 5/3. Liaison will follow up with TOC and family to coordinate consents.[facility] will notify TOC when registration paperwork has been completed to arrange transport.    RN please call report to [phone].   Thank you,      Elsie Saas, RN, CCM       [facility] Liaison (listed on AMION under Hospice[facility])     [phone]

## 2020-02-25 NOTE — Progress Notes (Signed)
STROKE TEAM PROGRESS NOTE   INTERVAL HISTORY Husband at bedside. Pt lying in bed, unresponsive but comfortable not in distress. Still on morphine drip. RR decreased from yesterday.  OBJECTIVE Vitals:   02/22/20 1555 02/23/20 0630 02/24/20 0518 02/25/20 0708  BP: (!) 140/59 126/60 (!) 118/43 (!) 143/50  Pulse: 73 65 (!) 103 82  Resp: (!) 22 15 17  (!) 22  Temp: 99.6 F (37.6 C) 98.5 F (36.9 C) (!) 102.8 F (39.3 C) (!) 100.4 F (38 C)  TempSrc: Oral Axillary Oral Oral  SpO2: 93% 94% (!) 71% (!) 78%  Weight:      Height:       CBC:  Recent Labs  Lab 02/20/20 0518 02/21/20 0527  WBC 15.9* 16.2*  HGB 7.5* 7.6*  HCT 26.0* 25.8*  MCV 85.5 85.1  PLT 331 629   Basic Metabolic Panel:  Recent Labs  Lab 02/20/20 0518 02/21/20 0527  NA 150* 148*  K 3.5 4.1  CL 115* 113*  CO2 28 25  GLUCOSE 144* 147*  BUN 33* 34*  CREATININE 0.69 0.78  CALCIUM 8.0* 8.1*  MG 2.3 2.3  PHOS 3.3 3.7    IMAGING No results found.   Physical exam Limited neuro exam due to comfort care. Pt lying in bed, not in distress. No spontaneous movement. Eyes closed, not open on voice. Tongue mildly protrusion with left deviation due to gravity. Facial no significant asymmetry.    ASSESSMENT/PLAN [name] is a 64 y.o. female with history of Htn, Hld and estrogen therapy presenting with left gaze and right side flaccid. Vomited and was intubated  She did not receive IV t-PA due to [place].  Stroke:  left MCA and bilateral ACA infarcts with large hematoma centered within the left frontal lobe likely due to hemorrhagic conversion s/p crani for hematoma evacuation - stroke etiology likely due to PAF  CT Head 02/02/2020 - Very large acute parenchymal hemorrhage centered within the left frontal lobe measuring 8.2 x 6.5 x 5.6 cm. 5 mm rightward midline shift at the level of the septum pellucidum.   CT head - 02/13/2020 - Unchanged size of massive intraparenchymal hematoma centered in the left frontal  lobe.  MRI head - unchanged hematoma and surrounding edema. DWI signal at left MCA and ACA consistent with acute infarcts. Scattered punctate acute infarcts in both cerebral hemispheres suggesting emboli  CTA H&N - No large vessel occlusion or high-grade arterial stenosis. No evidence of vascular malformation on the current exam.  CT repeat 4/19 - significantly decreased hematoma post evacuation  CT repeat 4/21 - stable residual hematoma and 4 mm of midline shift  CT Head 4/24 - Stable appearance status post evacuation of large left superior hemisphere intra-axial hematoma, aside from decreased pneumocephalus.  2D Echo - EF 65-70%   Sars Corona Virus 2 - negative - Ab positive  LDL - 74  HgbA1c - 5.6  UDS - negative  VTE prophylaxis - heparin subq  No antithrombotic prior to admission, now on No antithrombotic due to hemorrhage  Palliative care consulted to assist dtr and family with goals of care. Now on full comfort are    Acute Respiratory Failure  Intubated for airway protection  CCM on board  Bronch w/ ET repositioning, Cx  No extubation candidate - one way extubation - comfort care now  Aspiration Pneumonia Leukocytosis Fever  Pneumothorax, RIght  Nausea vomiting on presentation  Cefepime 4/16 >>4/22 ->off  CCM on board  Temp - 102.4->102.8->100.4  WBCs -  15.9->16.2  CXR 4/26 - No visible PNX. Stable patchy B airspace opacities  UA neg  Resp Cx 4/18 normal flora  Blood Cx 4/20 NGTD    Resp Cx 4/21 no growth  Resp Cx 4/23 normal  Vancomycin 4/23>>4/25-> off  Meropenem 4/23>> off  Cerebral edema  5 mm Rightward Midline Shift on CT and MRI  NSG on board s/p hematoma evacuation 4/17   CT 4/21 stable residual hematoma and midline shift  23.4% saline x1  Treated with 3%, now off   Na - 150->148  Atrial Fibrillation w/ RVR  New diagnosis  Given her embolic strokes on MRI, pt likely has PAF before admission  Now back to  NSR  Amiodarone discontinued  On cardizem IV - transitioned to po cardizem - now off due to comfort care  Cardiology consulted - appreciate help  UE shivering/shaking, resolved  Bilateral versus unilateral arm shaking/shivering  LTM EEG no seizure to correlate the shaking -> discontinued 4/19  Keppra - 500 mg Q 12 hrs after Keppra 2 g load - now off  Seizure precautions  Hypertension  Home BP meds: Zestril 10 mg daily  off Cleviprex and normodyne (infusion)  On cardizem drip->po - now off for comfort care  Added lisinopril 10mg  bid -> off d/t eyelid swelling  Hyperlipidemia  Home Lipid lowering medication: Pravachol 20 mg daily  LDL 74, goal < 70  Current lipid lowering medication: None for now  Dysphagia  N.p.o.  On tube feeding at 45cc/hr - now off  Free water 1000 q2h - now off  Anemia, acute blood loss  Hb - 7.5->7.6  Likely due to post op  S/p PRBC in OR  Iron 16 and ferritin 66  Other Stroke Risk Factors  Advanced age  Morbid Obesity, Body mass index is 40.76 kg/m., recommend weight loss, diet and exercise as appropriate   On estrogen daily po PTA   Other Active Problems  Code status - DNR  Appreciate Palliative Care's assistance - Compassionate extubation to comfort focused care performed 02/23/20  Hospital day # 16  02/25/20, MD PhD Stroke Neurology 02/25/2020 11:13 AM  To contact Stroke Continuity provider, please refer to 04/26/2020. After hours, contact General Neurology

## 2020-02-26 DIAGNOSIS — I69391 Dysphagia following cerebral infarction: Secondary | ICD-10-CM

## 2020-02-26 DIAGNOSIS — I63412 Cerebral infarction due to embolism of left middle cerebral artery: Secondary | ICD-10-CM | POA: Diagnosis not present

## 2020-02-26 DIAGNOSIS — R6883 Chills (without fever): Secondary | ICD-10-CM | POA: Diagnosis not present

## 2020-02-26 DIAGNOSIS — I619 Nontraumatic intracerebral hemorrhage, unspecified: Secondary | ICD-10-CM | POA: Diagnosis not present

## 2020-02-26 DIAGNOSIS — Z515 Encounter for palliative care: Secondary | ICD-10-CM | POA: Diagnosis not present

## 2020-02-26 DIAGNOSIS — I4891 Unspecified atrial fibrillation: Secondary | ICD-10-CM | POA: Diagnosis not present

## 2020-02-26 DIAGNOSIS — G936 Cerebral edema: Secondary | ICD-10-CM | POA: Diagnosis not present

## 2020-02-26 DIAGNOSIS — J939 Pneumothorax, unspecified: Secondary | ICD-10-CM | POA: Diagnosis not present

## 2020-02-26 DIAGNOSIS — E46 Unspecified protein-calorie malnutrition: Secondary | ICD-10-CM | POA: Diagnosis not present

## 2020-02-26 MED ORDER — LORAZEPAM 2 MG/ML IJ SOLN
0.5000 mg | Freq: Two times a day (BID) | INTRAMUSCULAR | Status: DC
  Administered 2020-02-26: 0.5 mg via INTRAVENOUS
  Filled 2020-02-26: qty 1

## 2020-02-26 NOTE — Social Work (Signed)
CSW received a message from [place] with PMT. Pt not stable for transfer to [facility] per PMT. Pt will remain in hospital for comfort care measures.   [facility] also aware.   Octavio Graves, MSW, LCSW [facility] Clinical Social Work

## 2020-02-26 NOTE — Progress Notes (Addendum)
Daily Progress Note   Patient Name: Meredith Cooper       Date: 02/26/2020 DOB: Sep 17, 1956  Age: 64 y.o. MRN#: [id number] Attending Physician: Marvel Plan, MD Primary Care Physician: Carren Rang, PA-C Admit Date: 01/25/2020  Reason for Consultation/Follow-up: To discuss complex medical decision making related to patient's goals of care  Subjective: Patient actively dying.  Overall appears comfortable, but breathing is slightly more labored and coarse. Has been on IV morphine infusion of 4 mg/hour for 3 days.  Will increase now to 5 mg/hour due to increased work of breathing. Receiving robinul 0.2 mg IV q 4 hours with a scope patch.    Patient still making urine.  Rectal flexiseal still in place.  Family not at bedside.   Assessment: Patient actively dying.     Patient Profile/HPI:  64 y.o.femalewith past medical history of thyroid disease, hypertension, hyperlipidemia, hiatal hernia, GERDadmitted on 4/16/2021with altered mental status, flaccid right side, and left gaze.Found to have large left frontal hemorrhage, left MCA and bilateral ACA infarcts with large hematoma centered within left frontal lobe likely hemorrhagic conversion s/p decompressive craniectomy. PCCM and neurology following. Poor neurological recovery and poor prognosis. Palliative medicine consultation for goals of care.  Length of Stay: 17   Vital Signs: BP (!) 150/47 (BP Location: Left Arm)   Pulse 92   Temp (!) 102.3 F (39.1 C) (Oral)   Resp 14   Ht 5\' 4"  (1.626 m)   Wt 107.7 kg   SpO2 (!) 85%   BMI 40.76 kg/m  SpO2: SpO2: (!) 85 % O2 Device: O2 Device: Room Air O2 Flow Rate:         Palliative Assessment/Data: 10%     Palliative Care Plan    Recommendations/Plan:  Increase morphine gtt  to 5 mg / hour.    Discussed concern over patient in PMT rounds.  Patient is needing morphine gtt at 5 mg/hour.  Will likely need increased symptom management as she continues to decline.   Family concerned patient is too fragile to transfer in ambulance.  Recommend patient remain in hospital.  Anticipate death in 24 - 48 hours.   Code Status:  DNR  Prognosis:   Hours - Days   Discharge Planning:  hospital death  Care plan was discussed with RN, Tech  Thank you for allowing  the Palliative Medicine Team to assist in the care of this patient.  Total time spent:  15 min.     Greater than 50%  of this time was spent counseling and coordinating care related to the above assessment and plan.  Florentina Jenny, PA-C Palliative Medicine  Please contact Palliative MedicineTeam phone at [phone] for questions and concerns between 7 am - 7 pm.   Please see AMION for individual provider pager numbers.

## 2020-02-26 NOTE — Progress Notes (Signed)
Palliative Medicine RN Note: Patient discussed in PMT rounds. Per PMT providers, Meredith Cooper is not safe for transfer out of hospital. I have notified [facility], aka [facility] ([facility] and the [facility] members for [place].  Margret Chance Triana Coover, RN, BSN, [facility] Palliative Medicine Team 02/26/2020 9:16 AM Office [phone]

## 2020-02-26 NOTE — Progress Notes (Signed)
STROKE TEAM PROGRESS NOTE   INTERVAL HISTORY Husband at bedside.  Patient still on morphine drip, not in distress, respiratory rate further decreased.  Will hold off [facility] transfer due to unstable condition.  Vitals:   02/23/20 0630 02/24/20 0518 02/25/20 0708 02/26/20 0350  BP: 126/60 (!) 118/43 (!) 143/50 (!) 150/47  Pulse: 65 (!) 103 82 92  Resp: 15 17 (!) 22 14  Temp: 98.5 F (36.9 C) (!) 102.8 F (39.3 C) (!) 100.4 F (38 C) (!) 102.3 F (39.1 C)  TempSrc: Axillary Oral Oral Oral  SpO2: 94% (!) 71% (!) 78% (!) 85%  Weight:      Height:        Physical exam   Limited neuro exam due to comfort care. Pt lying in bed, not in distress. No spontaneous movement. Eyes closed, not open on voice. Tongue mildly protrusion with left deviation due to gravity. Facial no significant asymmetry.    ASSESSMENT/PLAN [name] is a 64 y.o. female with history of Htn, Hld and estrogen therapy presenting with left gaze and right side flaccid. Vomited and was intubated  She did not receive IV t-PA due to [facility].  Stroke:  left MCA and bilateral ACA infarcts with large hematoma centered within the left frontal lobe likely due to hemorrhagic conversion s/p crani for hematoma evacuation - stroke etiology likely due to PAF  Palliative care assisted dtr and family with goals of care. Now on full comfort care. Too unstable for transfer to [facility]. Hospital death expected.    Cerebral edema  Atrial Fibrillation w/ RVR  Hypertension  Hyperlipidemia  Dysphagia/Malnutrition  N.p.o.  Anemia, acute blood loss  Other Stroke Risk Factors  Advanced age  Morbid Obesity, Body mass index is 40.76 kg/m.  On estrogen daily po PTA   Other Active Problems  Code status - DNR  Appreciate Palliative Care's assistance - Compassionate extubation to comfort focused care performed 02/23/20  Hospital day # 17  Marvel Plan, MD PhD Stroke Neurology 02/26/2020 9:30 AM  To contact Stroke  Continuity provider, please refer to [email or web address]. After hours, contact General Neurology

## 2020-02-26 NOTE — Progress Notes (Signed)
Civil engineer, contracting [facility]) Hospital Liaison Note  [facility] PMT reached out to [facility] of the [facility] HLT to make aware that patient is actively dying and not stable for transfer to [facility] at this time. Spoke with patient daughter and spouse to support and confirm wishes. Patient family at bedside comforting patient and would like patient to stay in the hospital. Family has [facility] contact information if needs support.  Please reach out with any hospice related concerns as needed,  Roda Shutters, RN [facility] HLT (on [place]) [phone]

## 2020-03-26 NOTE — Death Summary Note (Signed)
Stroke Discharge Summary  Patient ID: Meredith Cooper   MRN: [id number]      DOB: July 09, 1956  Date of Admission: 02/16/2020 Date of Discharge: 5//2021  Attending Physician:  Dr. 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 Active Problems:   Terminal care   Morbid obesity (HCC)   Essential hypertension   Hyperlipidemia   Acute respiratory failure with hypoxemia (HCC)   Acute blood loss anemia   VAP (ventilator-associated pneumonia) (HCC)   Cerebral brain hemorrhage (HCC), hemorrhagic transformation of ischemic infarct   Endotracheally intubated   Palliative care by specialist   Increased oropharyngeal secretions   Pneumothorax on right   Cerebral edema (HCC)   Atrial fibrillation with RVR (HCC)   Shivering   Dysphagia due to recent cerebral infarction   Malnutrition (HCC)  LABORATORY STUDIES CBC    Component Value Date/Time   WBC 16.2 (H) 02/21/2020 0527   RBC 3.03 (L) 02/21/2020 0527   HGB 7.6 (L) 02/21/2020 0527   HCT 25.8 (L) 02/21/2020 0527   PLT 350 02/21/2020 0527   MCV 85.1 02/21/2020 0527   MCH 25.1 (L) 02/21/2020 0527   MCHC 29.5 (L) 02/21/2020 0527   RDW 15.8 (H) 02/21/2020 0527   LYMPHSABS 2.4 02/18/2020 0507   MONOABS 1.1 (H) 02/18/2020 0507   EOSABS 0.3 02/18/2020 0507   BASOSABS 0.1 02/18/2020 0507   CMP    Component Value Date/Time   NA 148 (H) 02/21/2020 0527   K 4.1 02/21/2020 0527   CL 113 (H) 02/21/2020 0527   CO2 25 02/21/2020 0527   GLUCOSE 147 (H) 02/21/2020 0527   BUN 34 (H) 02/21/2020 0527   CREATININE 0.78 02/21/2020 0527   CALCIUM 8.1 (L) 02/21/2020 0527   PROT 5.1 (L) 02/19/2020 0415   ALBUMIN 1.4 (L) 02/19/2020 0415   AST  33 02/19/2020 0415   ALT 37 02/19/2020 0415   ALKPHOS 73 02/19/2020 0415   BILITOT 0.4 02/19/2020 0415   GFRNONAA >60 02/21/2020 0527   GFRAA >60 02/21/2020 0527   COAGS Lab Results  Component Value Date   INR 0.9 Feb 16, 2020   Lipid Panel    Component Value Date/Time   CHOL 164 02/14/2020 0347   TRIG 135 02/18/2020 1240   HDL 55 02/16/2020 0347   CHOLHDL 3.0 02/21/2020 0347   VLDL 35 02/23/2020 0347   LDLCALC 74 02/07/2020 0347   HgbA1C  Lab Results  Component Value Date   HGBA1C 5.6 02/16/2020   Urinalysis    Component Value Date/Time   COLORURINE YELLOW 02/13/2020 1131   APPEARANCEUR HAZY (A) 02/13/2020 1131   LABSPEC 1.032 (H) 02/13/2020 1131   PHURINE 5.0 02/13/2020 1131   GLUCOSEU NEGATIVE 02/13/2020 1131   HGBUR NEGATIVE 02/13/2020 1131   BILIRUBINUR NEGATIVE 02/13/2020 1131   KETONESUR NEGATIVE 02/13/2020 1131   PROTEINUR 100 (A) 02/13/2020 1131   NITRITE NEGATIVE 02/13/2020 1131   LEUKOCYTESUR NEGATIVE 02/13/2020 1131   Urine Drug Screen     Component Value Date/Time   LABOPIA NONE DETECTED 02/16/2020 1704   COCAINSCRNUR NONE DETECTED 02/16/20 1704   LABBENZ NONE DETECTED 16-Feb-2020 1704   AMPHETMU NONE DETECTED 02/16/20 1704   THCU NONE DETECTED 02-16-2020 1704  LABBARB NONE DETECTED 02/08/2020 1704    Alcohol Level    Component Value Date/Time   ETH <10 01/27/2020 1516    SIGNIFICANT DIAGNOSTIC STUDIES CT Code Stroke CTA Head W/WO contrast  Result Date: 01/27/2020 CLINICAL DATA:  Neuro deficit, acute, stroke suspected. Additional history provided: Unresponsive, left gaze, ri right-sided flaccid. EXAM: CT ANGIOGRAPHY HEAD AND NECK TECHNIQUE: Multidetector CT imaging of the head and neck was performed using the standard protocol during bolus administration of intravenous contrast. Multiplanar CT image reconstructions and MIPs were obtained to evaluate the vascular anatomy. Carotid stenosis measurements (when applicable) are obtained utilizing  NASCET criteria, using the distal internal carotid diameter as the denominator. CONTRAST:  75mL OMNIPAQUE IOHEXOL 350 MG/ML SOLN COMPARISON:  Concurrently performed non-contrast head CT. FINDINGS: CTA NECK FINDINGS Aortic arch: Standard aortic branching. The visualized aortic arch is unremarkable. No significant innominate or proximal subclavian artery stenosis. Right carotid system: CCA and ICA patent within the neck without stenosis. Left carotid system: CCA and ICA patent within the neck without stenosis. Vertebral arteries: The vertebral arteries are codominant and patent within the neck without significant stenosis. Skeleton: No acute bony abnormality or aggressive osseous lesion. Cervical spondylosis without high-grade bony spinal canal narrowing. Other neck: No neck mass or cervical lymphadenopathy. Upper chest: No consolidation within the imaged lung apices. Review of the MIP images confirms the above findings CTA HEAD FINDINGS Anterior circulation: The intracranial internal carotid arteries are patent without significant stenosis. The M1 middle cerebral arteries are patent without significant stenosis. No M2 proximal branch occlusion or high-grade proximal stenosis is identified. The anterior cerebral arteries are patent without significant proximal stenosis. No intracranial aneurysm is identified. There is no evidence of vascular malformation on the current exam. No contrast blush is seen on arterial phase imaging to suggest active arterial extravasation at site of the acute left frontal lobe parenchymal hematoma. Posterior circulation: The intracranial vertebral arteries are patent without significant stenosis, as is the basilar artery. The bilateral posterior cerebral arteries are patent without significant proximal stenosis. Posterior communicating arteries are poorly delineated and may be hypoplastic or absent bilaterally. Venous sinuses: Within limitations of contrast timing, no convincing thrombus.  Anatomic variants: As described. Review of the MIP images confirms the above findings These results were communicated to Dr. Pearlean Brownie At 3:58 pmon 04/03/2021by text page via the [facility] messaging system. IMPRESSION: CTA neck: The bilateral common carotid, internal carotid and vertebral arteries are patent within the neck without significant stenosis. CTA head: 1. No intracranial large vessel occlusion or proximal high-grade arterial stenosis. 2. No evidence of vascular malformation on the current exam. However, consider imaging follow-up once the hematoma involutes for further assessment. 3. No contrast blush is demonstrated in the region of the acute left frontal lobe hematoma on arterial phase imaging to suggest active extravasation. 4. No intracranial aneurysm is identified. Electronically Signed   By: Jackey Loge DO   On: 02/11/2020 15:59   CT HEAD WO CONTRAST  Result Date: 02/17/2020 CLINICAL DATA:  65 year old female who presented on 02/07/2020 with large superior left hemisphere intra-axial hemorrhage. Postoperative day 7 status post craniotomy and hematoma evacuation. EXAM: CT HEAD WITHOUT CONTRAST TECHNIQUE: Contiguous axial images were obtained from the base of the skull through the vertex without intravenous contrast. COMPARISON:  Head CTs 02/14/2020 and earlier. FINDINGS: Brain: Hematoma resection cavity with decreasing gas since 02/14/2020. Continued hyperdense blood products and regional post resection fluid and/or edema. Volume of hyperdense blood appears stable since 02/14/2020. And there is trace  overlying left superior subdural hematoma or dural thickening. Stable small volume subdural 6 blood along the falx. Edema and blood track toward the left caudothalamic groove, unchanged. Stable intracranial mass effect with partial effacement of the left lateral ventricle and 4-5 mm of rightward midline shift. Stable trace intraventricular hemorrhage. No ventriculomegaly. No new intracranial hemorrhage  identified. No acute cortically based infarct. Stable gray-white matter differentiation in the right hemisphere and posterior fossa. Vascular: Mild Calcified atherosclerosis at the skull base. Skull: Stable left vertex craniotomy.  No new osseous finding. Sinuses/Orbits: Increased opacification of the left tympanic cavity and mastoids. New contralateral right mastoid effusion. Left nasoenteric tube is stable. Comparatively mild paranasal sinus mucosal thickening and fluid. Other: Fluid in the pharynx, intubated. Postoperative changes to the left superior scalp are stable. No new orbit or scalp soft tissue abnormality. IMPRESSION: 1. Stable appearance status post evacuation of large left superior hemisphere intra-axial hematoma, aside from decreased pneumocephalus. 2. Stable volume of residual blood since 02/14/2020, including trace subdural and IVH components. Stable intracranial mass effect with 4 mm of rightward midline shift. 3. No new intracranial abnormality. Electronically Signed   By: Odessa Fleming M.D.   On: 02/17/2020 15:02   CT HEAD WO CONTRAST  Result Date: 02/14/2020 CLINICAL DATA:  Follow-up intracranial hemorrhage. EXAM: CT HEAD WITHOUT CONTRAST TECHNIQUE: Contiguous axial images were obtained from the base of the skull through the vertex without intravenous contrast. COMPARISON:  02/12/2020 FINDINGS: Brain: Postsurgical changes are again seen related to evacuation of a large left frontoparietal parenchymal hematoma. Residual left frontoparietal blood products and gas are similar to the prior motion degraded CT. Intraventricular extension is again noted with unchanged small volume blood products in the lateral ventricles, and there is no ventriculomegaly. Left frontoparietal edema is unchanged as is 4 mm of rightward midline shift. No new intracranial hemorrhage, new infarct, or extra-axial fluid collection is identified. Vascular: No hyperdense vessel. Skull: Left frontoparietal craniotomy with  overlying soft tissue swelling and skin staples. Sinuses/Orbits: Mild mucosal thickening in the paranasal sinuses. Small left mastoid effusion. Unremarkable orbits. Other: None. IMPRESSION: 1. Postoperative changes from left frontoparietal hematoma evacuation with unchanged residual blood products, edema, and 4 mm of rightward midline shift. 2. Unchanged small volume intraventricular blood products without ventriculomegaly. 3. No new intracranial abnormality. Electronically Signed   By: Sebastian Ache M.D.   On: 02/14/2020 16:49   CT HEAD WO CONTRAST  Result Date: 02/12/2020 CLINICAL DATA:  Intracranial hemorrhage. Status post craniotomy. EXAM: CT HEAD WITHOUT CONTRAST TECHNIQUE: Contiguous axial images were obtained from the base of the skull through the vertex without intravenous contrast. COMPARISON:  CT head without contrast/17/21. MR head without contrast 02-23-20. FINDINGS: Brain: Patient is now status post left frontal parietal craniotomy for evacuation of the hemorrhage. Hemorrhage is significantly debulked. Gas is present within the operative cavity. Morselized Gel-Foam is present within the surgical cavity. No new hemorrhage is present. Minimal pneumocephalus remains. Midline shift is reduced to 4 mm. Blood products are again noted within the posterior horn of the lateral ventricles without hydrocephalus. Vascular: Atherosclerotic calcifications are present within the cavernous internal carotid arteries. No hyperdense vessel is present. Skull: Insert normal skull postoperative changes are present in the left scalp. Sinuses/Orbits: Minimal fluid is present in the left sphenoid sinus. The paranasal sinuses and mastoid air cells are otherwise clear. The globes and orbits are within normal limits. IMPRESSION: 1. Status post left frontal parietal craniotomy for evacuation of the hemorrhage. 2. Hemorrhage is significantly debulked. Morselized Gel-Foam  is present within the surgical cavity. 3. No new  hemorrhage. 4. Improved midline shift, now measuring 4 mm. 5. Stable intraventricular hemorrhage without hydrocephalus. Electronically Signed   By: Marin Roberts M.D.   On: 02/12/2020 11:10   CT HEAD WO CONTRAST  Result Date: 2020/02/28 CLINICAL DATA:  Hemorrhage follow-up EXAM: CT HEAD WITHOUT CONTRAST TECHNIQUE: Contiguous axial images were obtained from the base of the skull through the vertex without intravenous contrast. COMPARISON:  Head CT 01/31/2020 FINDINGS: Brain: Massive intraparenchymal hematoma centered in the left frontal lobe is unchanged in size. There is an increased amount of blood in the occipital horn of the right lateral ventricle. Rightward midline shift measures 5 mm at the level of the foramina of [place]. Vascular: No abnormal hyperdensity of the major intracranial arteries or dural venous sinuses. No intracranial atherosclerosis. Skull: The visualized skull base, calvarium and extracranial soft tissues are normal. Sinuses/Orbits: No fluid levels or advanced mucosal thickening of the visualized paranasal sinuses. No mastoid or middle ear effusion. The orbits are normal. IMPRESSION: 1. Unchanged size of massive intraparenchymal hematoma centered in the left frontal lobe. 2. Increased amount of blood in the occipital horn of the right lateral ventricle. 3. Unchanged rightward midline shift, measuring 5 mm. Electronically Signed   By: Deatra Robinson M.D.   On: 28-Feb-2020 06:32   CT Code Stroke CTA Neck W/WO contrast  Result Date: 01/27/2020 CLINICAL DATA:  Neuro deficit, acute, stroke suspected. Additional history provided: Unresponsive, left gaze, ri right-sided flaccid. EXAM: CT ANGIOGRAPHY HEAD AND NECK TECHNIQUE: Multidetector CT imaging of the head and neck was performed using the standard protocol during bolus administration of intravenous contrast. Multiplanar CT image reconstructions and MIPs were obtained to evaluate the vascular anatomy. Carotid stenosis measurements (when  applicable) are obtained utilizing NASCET criteria, using the distal internal carotid diameter as the denominator. CONTRAST:  75mL OMNIPAQUE IOHEXOL 350 MG/ML SOLN COMPARISON:  Concurrently performed non-contrast head CT. FINDINGS: CTA NECK FINDINGS Aortic arch: Standard aortic branching. The visualized aortic arch is unremarkable. No significant innominate or proximal subclavian artery stenosis. Right carotid system: CCA and ICA patent within the neck without stenosis. Left carotid system: CCA and ICA patent within the neck without stenosis. Vertebral arteries: The vertebral arteries are codominant and patent within the neck without significant stenosis. Skeleton: No acute bony abnormality or aggressive osseous lesion. Cervical spondylosis without high-grade bony spinal canal narrowing. Other neck: No neck mass or cervical lymphadenopathy. Upper chest: No consolidation within the imaged lung apices. Review of the MIP images confirms the above findings CTA HEAD FINDINGS Anterior circulation: The intracranial internal carotid arteries are patent without significant stenosis. The M1 middle cerebral arteries are patent without significant stenosis. No M2 proximal branch occlusion or high-grade proximal stenosis is identified. The anterior cerebral arteries are patent without significant proximal stenosis. No intracranial aneurysm is identified. There is no evidence of vascular malformation on the current exam. No contrast blush is seen on arterial phase imaging to suggest active arterial extravasation at site of the acute left frontal lobe parenchymal hematoma. Posterior circulation: The intracranial vertebral arteries are patent without significant stenosis, as is the basilar artery. The bilateral posterior cerebral arteries are patent without significant proximal stenosis. Posterior communicating arteries are poorly delineated and may be hypoplastic or absent bilaterally. Venous sinuses: Within limitations of  contrast timing, no convincing thrombus. Anatomic variants: As described. Review of the MIP images confirms the above findings These results were communicated to Dr. Pearlean Brownie At 3:58 pmon 4/16/2021by text  page via the [facility] messaging system. IMPRESSION: CTA neck: The bilateral common carotid, internal carotid and vertebral arteries are patent within the neck without significant stenosis. CTA head: 1. No intracranial large vessel occlusion or proximal high-grade arterial stenosis. 2. No evidence of vascular malformation on the current exam. However, consider imaging follow-up once the hematoma involutes for further assessment. 3. No contrast blush is demonstrated in the region of the acute left frontal lobe hematoma on arterial phase imaging to suggest active extravasation. 4. No intracranial aneurysm is identified. Electronically Signed   By: Kellie Simmering DO   On: Mar 06, 2020 15:59   MR BRAIN WO CONTRAST  Result Date: 01/29/2020 CLINICAL DATA:  Cerebral hemorrhage. EXAM: MRI HEAD WITHOUT CONTRAST TECHNIQUE: Multiplanar, multiecho pulse sequences of the brain and surrounding structures were obtained without intravenous contrast. COMPARISON:  Head CT 02/01/2020 FINDINGS: Brain: A large left frontoparietal parenchymal hematoma has not significantly changed in size, measuring 9.1 x 4.9 cm. There is mild surrounding edema with regional sulcal effacement unchanged rightward midline shift measuring 6 mm. There is a small amount of restricted diffusion surrounding the hematoma which is greatest inferiorly. The hematoma is again noted to extend into the left lateral ventricle, and there is small volume hemorrhage throughout the ventricles. The ventricles are unchanged in size from today's earlier CT. There are scattered punctate acute infarcts in both cerebral hemispheres, primarily cortical in location. FLAIR hyperintensity involving cerebral sulci bilaterally may reflect a combination of subarachnoid hemorrhage and  hyperoxygenation related to mechanical ventilation. There is no extra-axial fluid collection. Vascular: Major intracranial vascular flow voids are preserved. Skull and upper cervical spine: Unremarkable bone marrow signal. Sinuses/Orbits: Unremarkable orbits. Clear mastoid air cells. Minimal bilateral ethmoid air cell mucosal thickening. Other: None. IMPRESSION: 1. Unchanged large left frontoparietal parenchymal hematoma with unchanged rightward midline shift. Mild surrounding edema and restricted diffusion consistent with associated acute infarct. 2. Small volume intraventricular and subarachnoid hemorrhage. 3. Scattered punctate acute infarcts in both cerebral hemispheres suggesting emboli. Electronically Signed   By: Logan Bores M.D.   On: 02/23/2020 11:32   DG CHEST PORT 1 VIEW  Result Date: 02/21/2020 CLINICAL DATA:  Post removal of RIGHT thoracostomy tube EXAM: PORTABLE CHEST 1 VIEW COMPARISON:  Portable exam 1808 hours compared to 0536 hours FINDINGS: Interval removal of RIGHT jugular line and RIGHT thoracostomy tube. Tip of endotracheal tube projects 5.7 cm above carina. Feeding tube extends into abdomen. Rotated to the RIGHT. Stable heart size and mediastinal contours. Mild RIGHT upper lobe infiltrate. Remaining lungs clear. No pleural effusion or pneumothorax. IMPRESSION: No pneumothorax following RIGHT thoracostomy tube removal. Mild RIGHT upper lobe infiltrate. Electronically Signed   By: Lavonia Dana M.D.   On: 02/21/2020 19:57   DG CHEST PORT 1 VIEW  Result Date: 02/21/2020 CLINICAL DATA:  Stroke. Endotracheally intubated. EXAM: PORTABLE CHEST 1 VIEW COMPARISON:  02/19/2020 FINDINGS: Endotracheal tube, feeding tube, and right jugular central venous catheter remain in appropriate position. A right pleural pigtail catheter also remains in place. No pneumothorax or significant pleural effusion visualized. Previously seen infiltrates in the right upper lobe and left lower lung show improvement  since prior study. No new or worsening areas of opacity are seen. Heart size is stable. IMPRESSION: Improving right upper lobe and left lower lung infiltrates. Electronically Signed   By: Marlaine Hind M.D.   On: 02/21/2020 08:12   DG CHEST PORT 1 VIEW  Result Date: 02/19/2020 CLINICAL DATA:  ET tube EXAM: PORTABLE CHEST 1 VIEW COMPARISON:  02/18/2020  FINDINGS: Endotracheal tube, right central line, feeding tube and right chest tube remain in place, unchanged. Right upper lobe and left lower lobe airspace opacities again noted, unchanged. No visible pneumothorax currently. Heart is borderline in size. No effusions. IMPRESSION: No visible pneumothorax. Stable patchy bilateral airspace opacities. Electronically Signed   By: Charlett Nose M.D.   On: 02/19/2020 08:16   DG CHEST PORT 1 VIEW  Result Date: 02/18/2020 CLINICAL DATA:  Pneumothorax, prior abnormal chest x-ray EXAM: PORTABLE CHEST 1 VIEW COMPARISON:  02/18/2020 FINDINGS: Single frontal view of the chest demonstrates stable endotracheal tube, enteric catheter, right internal jugular catheter, and right-sided chest tube. Further decrease in the right apical pneumothorax, far less than 5%. Consolidation within the right mid and left lower lung zones unchanged. Increased interstitial prominence throughout the lungs may reflect mild volume overload. No large effusion. No displaced fracture. IMPRESSION: 1. Trace residual right apical pneumothorax. Decreased since prior study. 2. Persistent right mid and left lower lung consolidation. 3. Increased interstitial prominence favor volume overload. Electronically Signed   By: Sharlet Salina M.D.   On: 02/18/2020 19:28   DG CHEST PORT 1 VIEW  Result Date: 02/18/2020 CLINICAL DATA:  Endotracheal tube repositioning EXAM: PORTABLE CHEST 1 VIEW COMPARISON:  02/18/2020 at 3:28 p.m. FINDINGS: Single frontal view of the chest demonstrates repositioning of the endotracheal tube, tip projecting 2.2 cm above carina.  Enteric catheter, right internal jugular catheter, and right-sided chest tube are unchanged. Consolidation within the right mid and left lower lung zones again noted and stable. Small left effusion is suspected. There is a trace right apical pneumothorax unchanged since prior exam. IMPRESSION: 1. Repositioning of endotracheal tube, now well above carina. 2. Otherwise stable support devices. 3. Stable trace right apical pneumothorax. 4. Stable bilateral airspace disease. Electronically Signed   By: Sharlet Salina M.D.   On: 02/18/2020 16:30   DG CHEST PORT 1 VIEW  Result Date: 02/18/2020 CLINICAL DATA:  [age] status post chest tube placement. EXAM: PORTABLE CHEST 1 VIEW COMPARISON:  Earlier radiograph dated 02/18/2020 at 7:26 a.m. FINDINGS: There has been interval placement of a right-sided chest tube. Endotracheal tube with tip at the level of the carina. The tube can be pulled back by 2-3 cm for optimal positioning. Feeding tube extends below the diaphragm with tip beyond the inferior margin of the image. A small right pneumothorax is again noted similar or minimally decreased since the earlier radiograph. Slight improvement in aeration of the left lung. Bilateral airspace opacities are similar to prior radiograph. Stable cardiomediastinal silhouette. No acute osseous pathology. IMPRESSION: 1. Interval placement of a right-sided chest tube. 2. Small right pneumothorax similar or minimally decreased since the earlier radiograph. Electronically Signed   By: Elgie Collard M.D.   On: 02/18/2020 15:57   DG CHEST PORT 1 VIEW  Result Date: 02/18/2020 CLINICAL DATA:  Code stroke.  Patient intubated. EXAM: PORTABLE CHEST 1 VIEW COMPARISON:  February 17, 2020 FINDINGS: There is a new right-sided pneumothorax which measures 2.1 cm at the apex and 6 mm laterally. Focal infiltrate in the right mid lung is more prominent in the interval. Infiltrate in the left base is mildly more prominent in the  interval. The ETT terminates 1 cm above the carina. Recommend withdrawing 2 cm. The right central line is stable. The feeding tube terminates below today's film. IMPRESSION: 1. New right pneumothorax measuring 2.1 cm at the apex and 6 mm laterally. 2. The ETT terminates 1 cm above the carina. Recommend  withdrawing 2 cm. 3. The focal infiltrate in the right mid lung is more prominent in the interval. 4. Infiltrate in left base is more prominent as well. Findings called to the patient's nurse, Amy. Electronically Signed   By: Gerome Sam III M.D   On: 02/18/2020 12:58   DG CHEST PORT 1 VIEW  Result Date: 02/17/2020 CLINICAL DATA:  Code stroke.  History of end of a shin. EXAM: PORTABLE CHEST 1 VIEW COMPARISON:  February 16, 2020 FINDINGS: The ETT is in good position. The feeding tube terminates below today's film. The right IJ is in good position. No pneumothorax. Bilateral pulmonary infiltrates, right greater than left, stable. No change in the cardiomediastinal silhouette. IMPRESSION: 1. Support apparatus as above. 2. Bilateral pulmonary infiltrates, right greater than left, unchanged. Electronically Signed   By: Gerome Sam III M.D   On: 02/17/2020 13:01   DG CHEST PORT 1 VIEW  Result Date: 02/16/2020 CLINICAL DATA:  Intubated. EXAM: PORTABLE CHEST 1 VIEW COMPARISON:  Earlier today. FINDINGS: Endotracheal tube in satisfactory position. Right jugular catheter tip in the superior vena cava. Feeding tube extending into the stomach. Stable enlarged cardiac silhouette. No significant change in dense patchy opacity in the right mid lung zone. Slightly improved patchy opacity in the left lower lobe with some linear density currently demonstrated. Small right pleural effusion. Mild interval linear density at the right lung base. Thoracic spine degenerative changes. IMPRESSION: 1. Stable dense patchy opacity in the right mid lung zone, suspicious for pneumonia. 2. Slightly improved patchy atelectasis or  pneumonia in the left lower lobe. 3. Mild interval right basilar atelectasis. 4. Small right pleural effusion. 5. Stable cardiomegaly. Electronically Signed   By: Beckie Salts M.D.   On: 02/16/2020 09:26   DG CHEST PORT 1 VIEW  Result Date: 02/16/2020 CLINICAL DATA:  Respiratory failure. EXAM: PORTABLE CHEST 1 VIEW COMPARISON:  02/13/2020 FINDINGS: The endotracheal tube is now 9 cm above the carina and just at the thoracic inlet. The feeding tube is stable. The right IJ catheter is stable. Worsening/progressive right lung infiltrate. Persistent left basilar infiltrate. No large pleural effusions or pneumothorax. IMPRESSION: 1. Endotracheal tube is now 9 cm above the carina and just at the thoracic inlet. 2. Worsening right lung infiltrate. Electronically Signed   By: Rudie Meyer M.D.   On: 02/16/2020 07:50   DG CHEST PORT 1 VIEW  Result Date: 02/13/2020 CLINICAL DATA:  Fever EXAM: PORTABLE CHEST 1 VIEW COMPARISON:  Portable exam 1014 hours compared to 02/11/2020 FINDINGS: Tip of endotracheal tube projects 3.0 cm above carina. Nasogastric tube coiled in stomach with tip at fundus. RIGHT jugular line with tip projecting over SVC. Enlargement of cardiac silhouette. Increased opacity at the lung bases question progressive atelectasis or infiltrate. Upper lungs clear. Small LEFT pleural effusion. No pneumothorax. IMPRESSION: Increased bibasilar opacities question atelectasis versus infiltrate. Enlargement of cardiac silhouette with small LEFT pleural effusion. Electronically Signed   By: Ulyses Southward M.D.   On: 02/13/2020 12:04   DG CHEST PORT 1 VIEW  Result Date: 02/11/2020 CLINICAL DATA:  Code stroke, intubated EXAM: PORTABLE CHEST 1 VIEW COMPARISON:  Chest radiograph from one day prior. FINDINGS: Endotracheal tube tip is 2.8 cm above the carina. Right internal jugular central venous catheter terminates in the lower third of the SVC. Enteric tube loops in the body of the stomach and terminates in the  gastric fundus. Stable cardiomediastinal silhouette with mild cardiomegaly. No pneumothorax. No pleural effusion. Slightly low lung volumes. Hazy bibasilar  lung opacities appear similar. No overt pulmonary edema. IMPRESSION: 1. Well-positioned support structures. No pneumothorax. 2. Stable hazy bibasilar lung opacities, favor atelectasis. 3. Stable mild cardiomegaly without overt pulmonary edema. Electronically Signed   By: Delbert Phenix M.D.   On: 02/11/2020 10:37   Portable Chest xray  Result Date: 01/29/2020 CLINICAL DATA:  Code stroke.  Respiratory distress. EXAM: PORTABLE CHEST 1 VIEW COMPARISON:  22-Feb-2020 FINDINGS: Endotracheal tube terminates 3.8 cm above carina. Nasogastric tube is looped in the stomach with tip at gastric body. Right internal jugular line tip at low SVC. Mild cardiomegaly. Probable small left pleural effusion. No pneumothorax. Similar left base airspace disease. IMPRESSION: No change in small left pleural effusion and adjacent Airspace disease, likely atelectasis. Electronically Signed   By: Jeronimo Greaves M.D.   On: 02/07/2020 10:21   DG Chest Port 1 View  Result Date: 02-22-2020 CLINICAL DATA:  Check central line placement EXAM: PORTABLE CHEST 1 VIEW COMPARISON:  Film from earlier in the same day. FINDINGS: Endotracheal tube and gastric catheter are noted. Right jugular central line is now seen with the catheter tip at the cavoatrial junction. No pneumothorax is seen. Left basilar atelectasis is now seen. No bony abnormality is noted. IMPRESSION: Increasing left basilar atelectasis. Central line as described without pneumothorax. Electronically Signed   By: Alcide Clever M.D.   On: 22-Feb-2020 19:28   DG Chest Portable 1 View  Result Date: Feb 22, 2020 CLINICAL DATA:  Stroke-like symptoms EXAM: PORTABLE CHEST 1 VIEW COMPARISON:  None. FINDINGS: Cardiac shadow is mildly enlarged but accentuated by the portable technique. Endotracheal tube and gastric catheter are seen. Gastric  catheter is noted in the distal esophagus and should be advanced several cm further into the stomach. The overall inspiratory effort is poor with crowding of the vascular markings. Mild interstitial edema cannot be totally excluded. IMPRESSION: Tubes and lines as described above. The gastric catheter lies in the distal esophagus and should be advanced several cm further into the stomach. Increased interstitial markings likely related to the poor inspiratory effort however mild edema cannot be excluded. Electronically Signed   By: Alcide Clever M.D.   On: 02-22-20 16:33   DG Abd Portable 1V  Result Date: 02/22/2020 CLINICAL DATA:  Check gastric catheter placement EXAM: PORTABLE ABDOMEN - 1 VIEW COMPARISON:  None. FINDINGS: Gastric catheter is noted looped within the stomach. The tip projects into the fundus. No obstructive changes are seen. No free air is noted. IMPRESSION: Gastric catheter as described. Electronically Signed   By: Alcide Clever M.D.   On: 02-22-2020 19:27   ECHOCARDIOGRAM COMPLETE  Result Date: 02/22/2020    ECHOCARDIOGRAM REPORT   Patient Name:   JADEE GOLEBIEWSKI Date of Exam: 01/31/2020 Medical Rec #:  [id number]   Height:       64.0 in Accession #:    [id number]  Weight:       241.0 lb Date of Birth:  1956/02/24   BSA:          2.118 m Patient Age:    64 years    BP:           148/51 mmHg Patient Gender: F           HR:           83 bpm. Exam Location:  Inpatient Procedure: 2D Echo, Cardiac Doppler, Color Doppler and Intracardiac            Opacification Agent Indications:    Stroke 434.91/I163.9  History:  Patient has no prior history of Echocardiogram examinations.                 Risk Factors:Hypertension and Dyslipidemia.  Sonographer:    Ross Ludwig RDCS (AE) Referring Phys: [id number] [facility]  Sonographer Comments: Suboptimal apical window, echo performed with patient supine and on artificial respirator and patient is morbidly obese. Image acquisition challenging due to patient body  habitus. IMPRESSIONS  1. Left ventricular ejection fraction, by estimation, is 65 to 70%. The left ventricle has normal function. The left ventricle has no regional wall motion abnormalities. There is mild left ventricular hypertrophy. Left ventricular diastolic parameters are indeterminate.  2. Right ventricular systolic function is normal. The right ventricular size is normal. There is moderately elevated pulmonary artery systolic pressure.  3. The mitral valve is normal in structure. Trivial mitral valve regurgitation.  4. Tricuspid valve regurgitation is moderate.  5. The aortic valve is tricuspid. Aortic valve regurgitation is trivial. No aortic stenosis is present. FINDINGS  Left Ventricle: Left ventricular ejection fraction, by estimation, is 65 to 70%. The left ventricle has normal function. The left ventricle has no regional wall motion abnormalities. Definity contrast agent was given IV to delineate the left ventricular  endocardial borders. The left ventricular internal cavity size was normal in size. There is mild left ventricular hypertrophy. Left ventricular diastolic parameters are indeterminate. Right Ventricle: The right ventricular size is normal. No increase in right ventricular wall thickness. Right ventricular systolic function is normal. There is moderately elevated pulmonary artery systolic pressure. The tricuspid regurgitant velocity is 3.18 m/s, and with an assumed right atrial pressure of 10 mmHg, the estimated right ventricular systolic pressure is 50.4 mmHg. Left Atrium: Left atrial size was normal in size. Right Atrium: Right atrial size was normal in size. Pericardium: Trivial pericardial effusion is present. Mitral Valve: The mitral valve is normal in structure. Trivial mitral valve regurgitation. MV peak gradient, 6.2 mmHg. The mean mitral valve gradient is 2.0 mmHg. Tricuspid Valve: The tricuspid valve is normal in structure. Tricuspid valve regurgitation is moderate. Aortic Valve:  The aortic valve is tricuspid. Aortic valve regurgitation is trivial. No aortic stenosis is present. Aortic valve mean gradient measures 9.0 mmHg. Aortic valve peak gradient measures 19.7 mmHg. Aortic valve area, by VTI measures 2.40 cm. Pulmonic Valve: The pulmonic valve was not well visualized. Pulmonic valve regurgitation is not visualized. Aorta: The aortic root and ascending aorta are structurally normal, with no evidence of dilitation. IAS/Shunts: The interatrial septum was not well visualized.  LEFT VENTRICLE PLAX 2D LVIDd:         5.10 cm  Diastology LVIDs:         2.90 cm  LV e' lateral:   12.40 cm/s LV PW:         1.30 cm  LV E/e' lateral: 12.1 LV IVS:        1.30 cm  LV e' medial:    8.81 cm/s LVOT diam:     2.00 cm  LV E/e' medial:  17.0 LV SV:         106 LV SV Index:   50 LVOT Area:     3.14 cm  RIGHT VENTRICLE             IVC RV Basal diam:  3.50 cm     IVC diam: 2.40 cm RV Mid diam:    3.20 cm RV S prime:     20.70 cm/s TAPSE (M-mode): 2.5 cm LEFT ATRIUM  Index       RIGHT ATRIUM           Index LA diam:        4.90 cm 2.31 cm/m  RA Area:     17.60 cm LA Vol (A2C):   56.8 ml 26.82 ml/m RA Volume:   45.70 ml  21.58 ml/m LA Vol (A4C):   67.5 ml 31.88 ml/m LA Biplane Vol: 64.3 ml 30.36 ml/m  AORTIC VALVE AV Area (Vmax):    2.45 cm AV Area (Vmean):   2.36 cm AV Area (VTI):     2.40 cm AV Vmax:           222.00 cm/s AV Vmean:          136.000 cm/s AV VTI:            0.442 m AV Peak Grad:      19.7 mmHg AV Mean Grad:      9.0 mmHg LVOT Vmax:         173.00 cm/s LVOT Vmean:        102.000 cm/s LVOT VTI:          0.338 m LVOT/AV VTI ratio: 0.76  AORTA Ao Root diam: 3.30 cm Ao Asc diam:  3.10 cm MITRAL VALVE                TRICUSPID VALVE MV Area (PHT): 3.85 cm     TR Peak grad:   40.4 mmHg MV Peak grad:  6.2 mmHg     TR Vmax:        318.00 cm/s MV Mean grad:  2.0 mmHg MV Vmax:       1.24 m/s     SHUNTS MV Vmean:      63.0 cm/s    Systemic VTI:  0.34 m MV Decel Time: 197 msec      Systemic Diam: 2.00 cm MV E velocity: 150.00 cm/s MV A velocity: 86.50 cm/s MV E/A ratio:  1.73 Epifanio Lesches MD Electronically signed by Epifanio Lesches MD Signature Date/Time: 02/13/2020/5:05:54 PM    Final    CT HEAD CODE STROKE WO CONTRAST  Addendum Date: 02/16/2020   ADDENDUM REPORT: 02/04/2020 20:26 ADDENDUM: Attempted to reach primary team via text page at the time of interpretation. Per EMR, primary team aware of imaging findings. Electronically Signed   By: Jackey Loge DO   On: 02/23/2020 20:26   Result Date: 01/28/2020 CLINICAL DATA:  Code stroke. Neuro deficit, acute, stroke suspected. Additional history provided: Unresponsive, left gaze, right-sided flaccid. EXAM: CT HEAD WITHOUT CONTRAST TECHNIQUE: Contiguous axial images were obtained from the base of the skull through the vertex without intravenous contrast. COMPARISON:  No pertinent prior studies available for comparison. FINDINGS: Brain: There is a very large acute parenchymal hemorrhage centered within the left frontal lobe which measures 8.2 x 6.5 x 5.6 cm. There is surrounding parenchymal edema. There is mild intraventricular extension into the left foramen of Monro, third ventricle and cerebral aqueduct. Associated mass effect with significant partial effacement of the left lateral ventricle. 5 mm rightward midline shift measured at the level of the septum pellucidum. No hydrocephalus or evidence of ventricular entrapment on the current exam. Elsewhere the brain appears unremarkable. There is no extra-axial fluid collection. Vascular: No hyperdense vessel. Skull: Normal. Negative for fracture or focal lesion. Sinuses/Orbits: Visualized orbits demonstrate no acute abnormality. Mild ethmoid sinus mucosal thickening. No significant mastoid effusion. IMPRESSION: Very large acute parenchymal hemorrhage centered within the left frontal lobe  measuring 8.2 x 6.5 x 5.6 cm. Surrounding parenchymal edema. Mild intraventricular extension  of hemorrhage into the left foramen of Monro, third ventricle and cerebral aqueduct. Associated mass effect with partial effacement of the left lateral ventricle. 5 mm rightward midline shift at the level of the septum pellucidum. Electronically Signed: By: Jackey Loge DO On: 02/01/2020 15:40      HISTORY OF PRESENT ILLNESS Marciana Uplinger is an 64 y.o. female with PMH HTN, HLD, on estrogen who presented to ED as a code stroke for left gaze and right sided flaccidity. Patient was at a green house. She went to pick up a box and fell over to the left. EMS was called. Patient noted to have left gaze and right sided flaccidity. Patient vomited 2 times in CT. Intubated for airway protection. 3% saline bolus and drip initiated. BP mildly elevated. Started on cleviplex drip. CT head showed a very large acute parenchymal hemorrhage centered within the left frontal lobe with 5 mm midline shift. CTA head with no anuerysm. BP: 148/70 BG: 142. She was last known well 01/31/2020 at 1415. Modified Rankin: Rankin Score=0. Intracerebral Hemorrhage (ICH) Score 2. She was admitted to the neuro ICU.    HOSPITAL COURSE Ms. Maanya Hippert is a 64 y.o. female with history of HTN, HLD and estrogen therapy presenting with left gaze and right sided flaccidity. Vomited and was intubated. CT with large ICH.  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 5 mm rightward midline shift at the level of the septum pellucidum.   CT head - 2020/02/21 - Unchanged size of massive intraparenchymal hematoma centered in the left frontal lobe.  MRI head - unchanged hematoma and surrounding edema. DWI signal at left MCA and ACA consistent with acute infarcts. Scattered  punctate acute infarcts in both cerebral hemispheres suggesting emboli  CTA H&N - No large vessel occlusion or high-grade arterial stenosis. No evidence of vascular malformation on the current exam.  CT repeat 4/19 - significantly decreased hematoma post evacuation  CT repeat 4/21 - stable residual hematoma and 4 mm of midline shift  CT Head 4/24 - Stable appearance status post evacuation of large left superior hemisphere intra-axial hematoma, aside from decreased pneumocephalus.  2D Echo - EF 65-70%   Sars Corona Virus 2 - negative - Ab positive  LDL - 74  HgbA1c - 5.6  UDS - negative  No antithrombotic prior to admission, now on No antithrombotic due to hemorrhage  Palliative care assisted dtr and family with goals of care, transitioned to full comfort care. Given severity of illness, unable to transition to residential hospital  In-hospital death Mar 09, 2020 at 0518   Acute Respiratory Failure   Intubated for airway protection in the ED  CCM consulted  Had Bronch w/ ET repositioning, Cx  Not an extubation candidate  Compassionate extubation to comfort focused care performed 02/23/20  Aspiration Pneumonia Leukocytosis Fever  Pneumothorax, RIght  Nausea vomiting on presentation  Cefepime 4/16 >>4/22  CCM consulted  Temp max 102.8  WBCs - 16.2  CXR 4/26 - No visible PNX. Stable patchy B airspace opacities  UA neg  Resp Cx 4/18 normal flora  Blood Cx 4/20 NGTD    Resp Cx  4/21 no growth  Resp Cx 4/23 normal  Vancomycin 4/23>>4/25  Meropenem 4/23>> 4/28   Cerebral edema  5 mm Rightward Midline Shift on CT and MRI  NSG on board s/p hematoma evacuation 4/17   CT 4/21 stable residual hematoma and midline shift  23.4% saline x1  Treated with 3%, now off   Na - 148  Atrial Fibrillation w/ RVR  New diagnosis  Given her embolic strokes on MRI, pt likely has PAF before admission  Now back to NSR  Treated w/ Amiodarone   Treated w/  cardizem IV - transitioned to po cardizem - now off due to comfort care  Cardiology consulted   UE shivering/shaking, resolved  Bilateral versus unilateral arm shaking/shivering  LTM EEG no seizure to correlate the shaking -> discontinued 4/19  Keppra - 500 mg Q 12 hrs after Keppra 2 g load - now off  Seizure precautions  Hypertension  Home BP meds: Zestril 10 mg daily  Treated w/ Cleviprex and normodyne (infusion)  On cardizem drip->po - now off for comfort care  Added lisinopril 10mg  bid -> off d/t eyelid swelling  Hyperlipidemia  Home Lipid lowering medication: Pravachol 20 mg daily  LDL 74, goal < 70  No lipid lowering medication d/t ICH, comfort care  Dysphagia Malnutrition  Unable to take orals, N.p.o.  Treated w/ tube feeding at 45cc/hr & prostat - now off  Free water 1000 q2h - now off  Anemia, acute blood loss  Hb - 7.6  Likely due to post op  S/p PRBC in OR  Iron 16 and ferritin 66  Other Stroke Risk Factors  Advanced age  Morbid Obesity, Body mass index is 40.76 kg/m.  On estrogen daily po PTA, stopped on admission    Other Active Problems  Code status - DNR  DISCHARGE  In-hospital death 03/15/2020 at 0518   Death certification completed by Dr. Wilford CornerArora and provided to RN  35 minutes were spent preparing discharge.  Annie MainSharon Jonnette Nuon, MSN, APRN, ANVP-BC, AGPCNP-BC Advanced Practice Stroke Nurse [facility] See Amion for Schedule & Pager information 03/18/2020 3:58 PM

## 2020-03-26 NOTE — Progress Notes (Signed)
Called by RN. Pt expired at 0518 hrs today. Death certificate completed and provided to RN  -- Milon Dikes, MD [facility]

## 2020-03-26 NOTE — Progress Notes (Signed)
Pt noted not breathing, no pulse, verified by charge nurse. Notified on call Dr Wilford Corner via text page and  Doctor came up and signed the death certificate. Notified husband Statia Burdick, stated he and his daughter will be coming up to see pt.

## 2020-03-26 DEATH — deceased

## 2020-12-19 ENCOUNTER — Encounter: Payer: Medicare Other | Admitting: Obstetrics and Gynecology

## 2021-01-13 IMAGING — CT CT HEAD W/O CM
4 series · 15 of 47 positions shown, 17 images · non-contrast
Comparison: Head CTs 02/14/2020 and earlier.

CLINICAL DATA: 64-year-old female who presented on 02/09/2020 with
large superior left hemisphere intra-axial hemorrhage. Postoperative
day 7 status post craniotomy and hematoma evacuation.

EXAM:
CT HEAD WITHOUT CONTRAST
TECHNIQUE: Contiguous axial images were obtained from the base of the skull
through the vertex without intravenous contrast.

[Series 3: head wo · axial · 0.45mm/px · z∈[+906,+1026]mm · 7 of 32 slices shown, 9 images]
[im 4/32  brain]
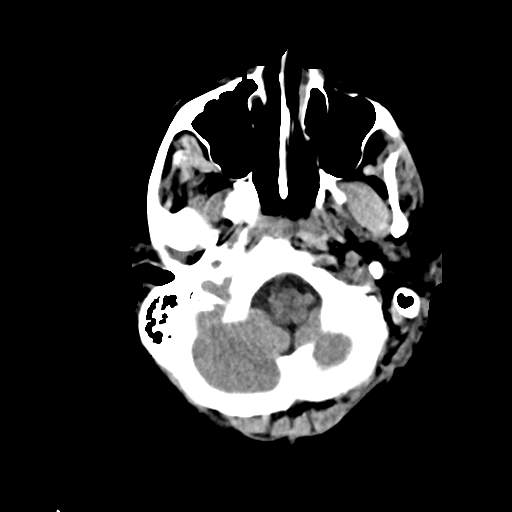
[im 4/32  bone]
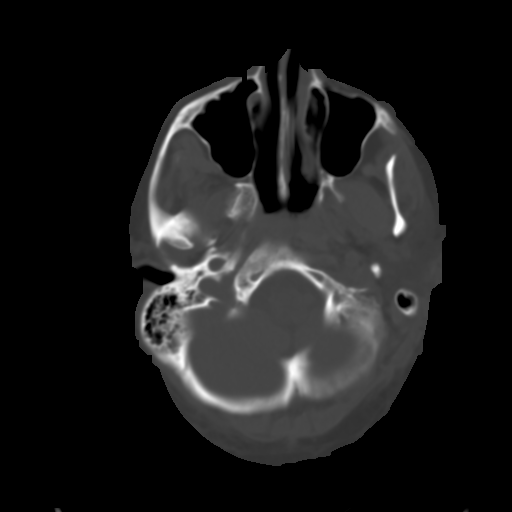
[im 8/32  brain]
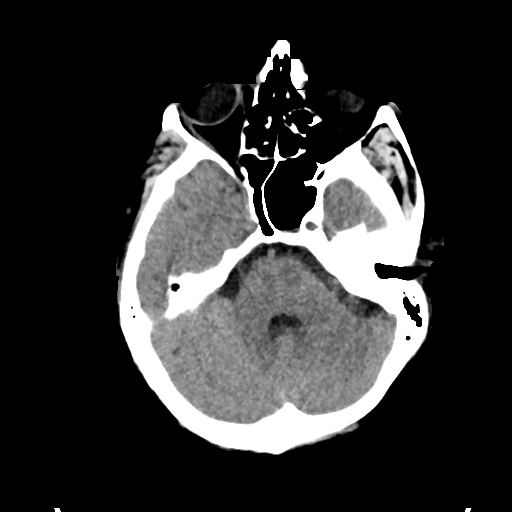
[im 12/32  brain]
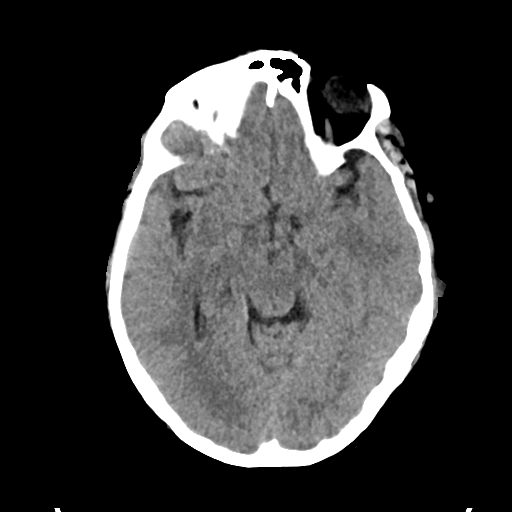
[im 16/32  brain]
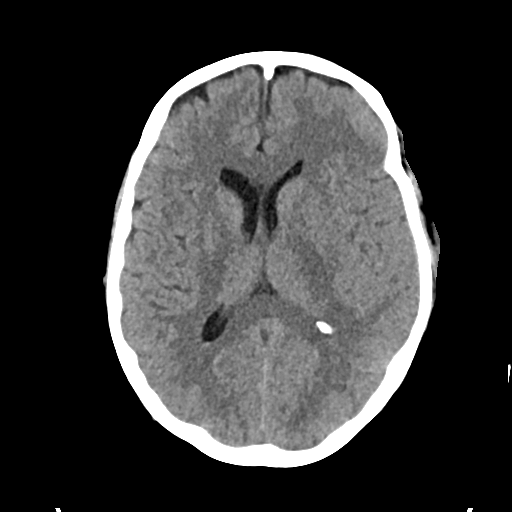
[im 20/32  brain]
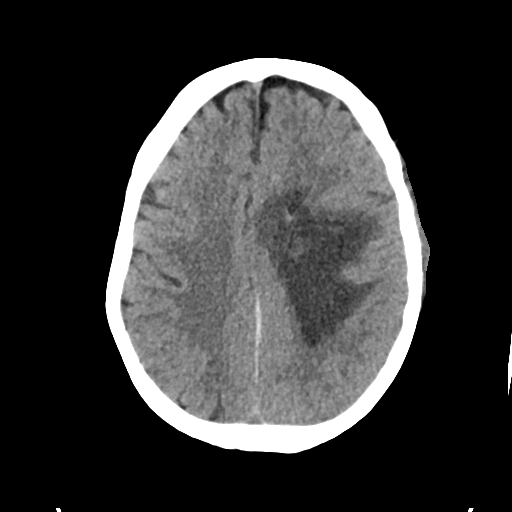
[im 20/32  bone]
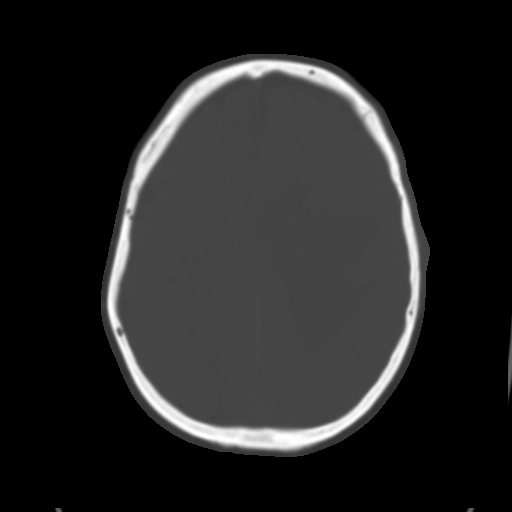
[im 24/32  brain]
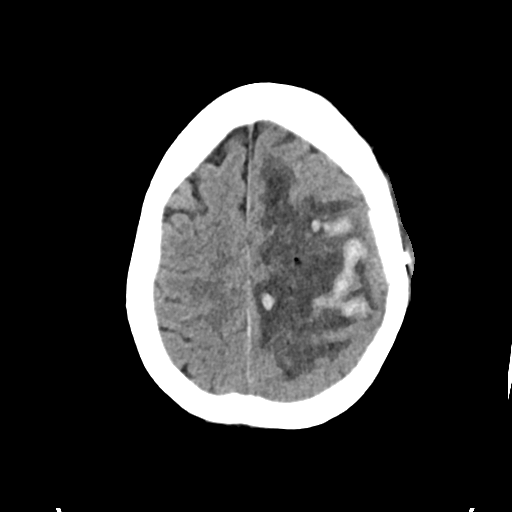
[im 28/32  brain]
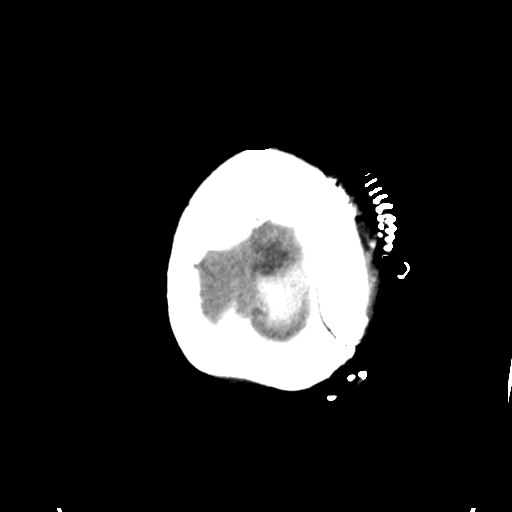

[Series 4: head bone · axial · 0.45mm/px · z∈[+906,+922]mm · 2 of 80 slices shown]
[im 8/80  bone]
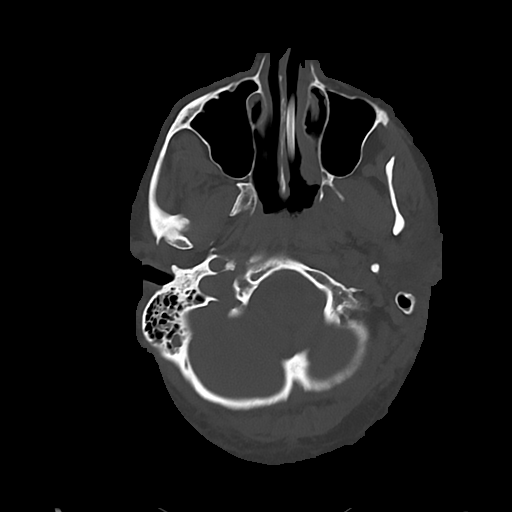
[im 16/80  bone]
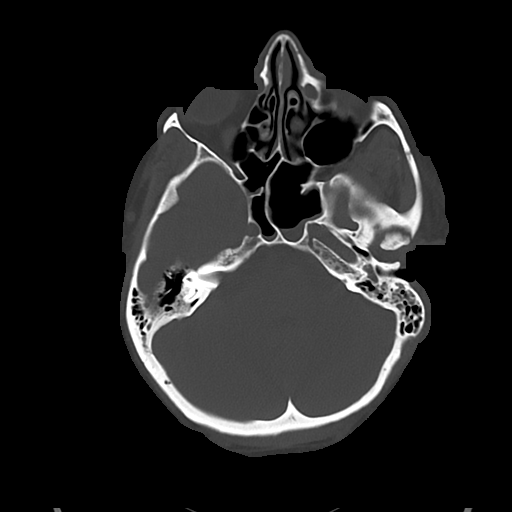

[Series 5: cor soft · coronal · 0.35mm/px · 3 of 67 slices shown]
[im 23/67  brain]
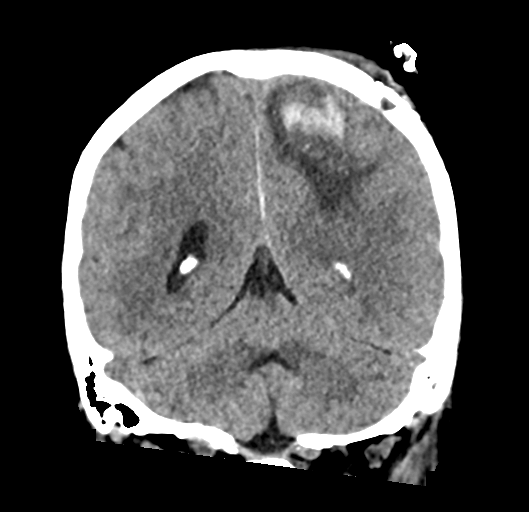
[im 30/67  brain]
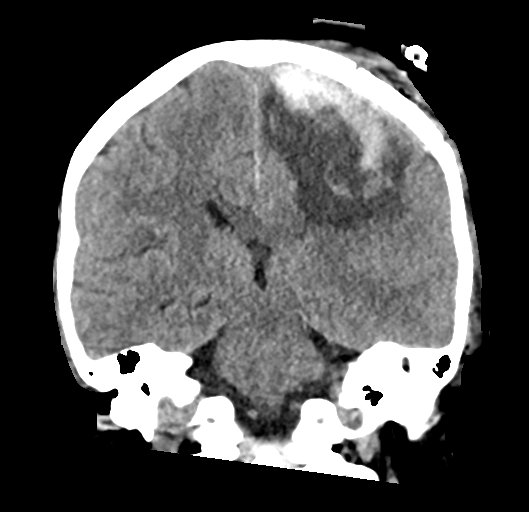
[im 37/67  brain]
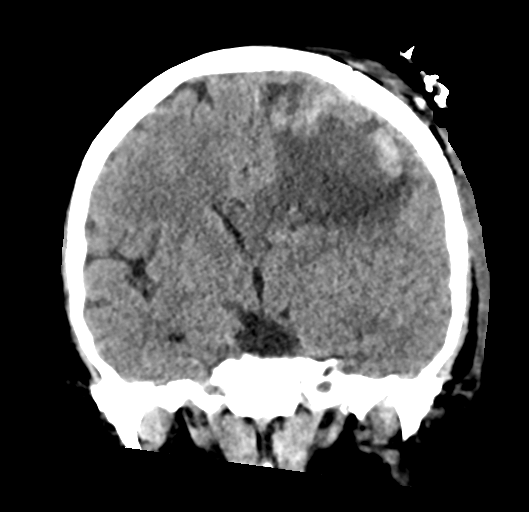

[Series 6: sag soft · sagittal · 0.34mm/px · 3 of 54 slices shown]
[im 19/54  brain]
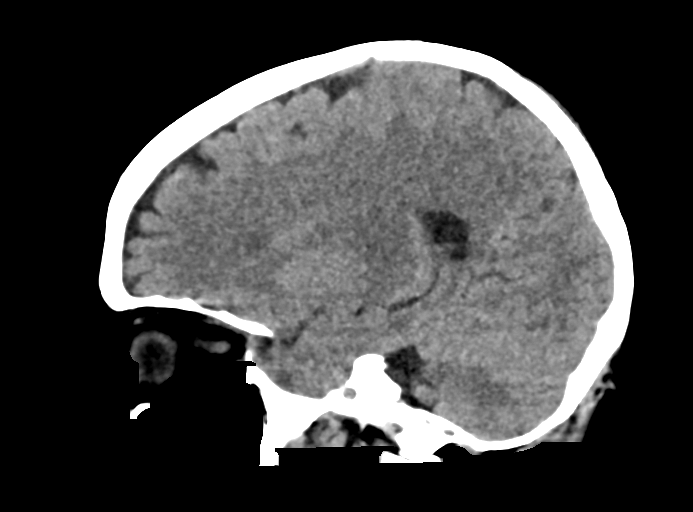
[im 27/54  brain]
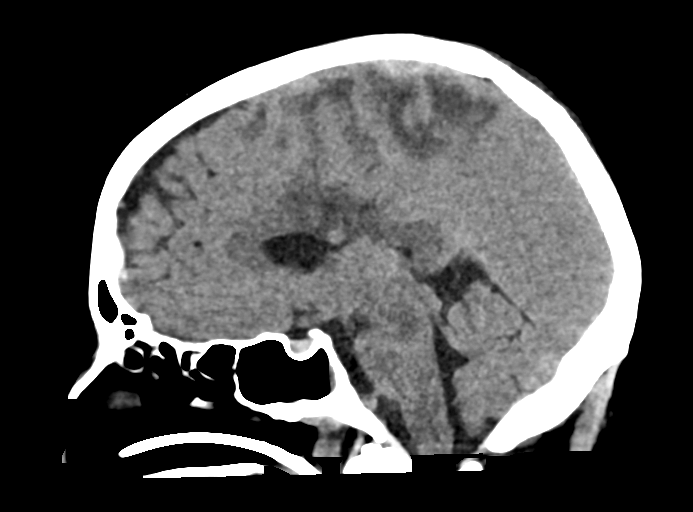
[im 35/54  brain]
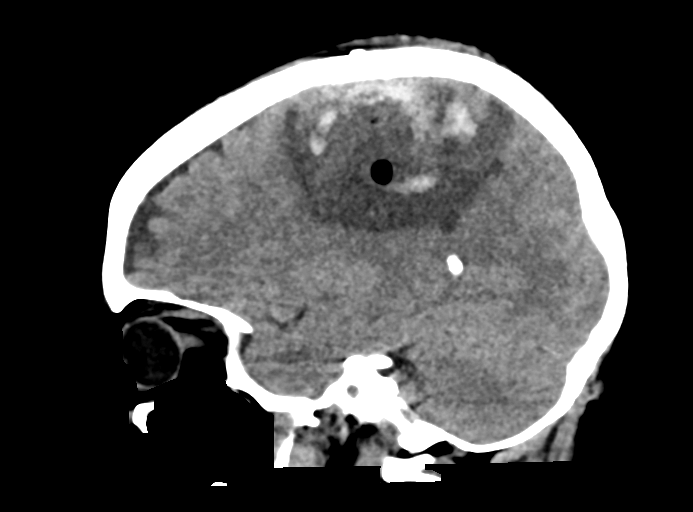

[15 of 47 positions shown; findings below may reference images not displayed]

FINDINGS: Brain: Hematoma resection cavity with decreasing gas since
02/14/2020. Continued hyperdense blood products and regional post
resection fluid and/or edema. Volume of hyperdense blood appears
stable since 02/14/2020. And there is trace overlying left superior
subdural hematoma or dural thickening. Stable small volume subdural
6 blood along the falx.

Edema and blood track toward the left caudothalamic groove,
unchanged. Stable intracranial mass effect with partial effacement
of the left lateral ventricle and 4-5 mm of rightward midline shift.
Stable trace intraventricular hemorrhage. No ventriculomegaly.

No new intracranial hemorrhage identified. No acute cortically based
infarct. Stable gray-white matter differentiation in the right
hemisphere and posterior fossa.

Vascular: Mild Calcified atherosclerosis at the skull base.

Skull: Stable left vertex craniotomy.  No new osseous finding.

Sinuses/Orbits: Increased opacification of the left tympanic cavity
and mastoids. New contralateral right mastoid effusion.

Left nasoenteric tube is stable. Comparatively mild paranasal sinus
mucosal thickening and fluid.

Other: Fluid in the pharynx, intubated.

Postoperative changes to the left superior scalp are stable. No new
orbit or scalp soft tissue abnormality.
IMPRESSION: 1. Stable appearance status post evacuation of large left superior
hemisphere intra-axial hematoma, aside from decreased
pneumocephalus.

2. Stable volume of residual blood since 02/14/2020, including trace
subdural and IVH components. Stable intracranial mass effect with 4
mm of rightward midline shift.

3. No new intracranial abnormality.

## 2021-01-14 IMAGING — DX DG CHEST 1V PORT
1 series · 1 of 1 positions shown · non-contrast
Comparison: 02/18/2020 at [DATE] p.m.

CLINICAL DATA: Endotracheal tube repositioning

EXAM:
PORTABLE CHEST 1 VIEW

[chest ap]
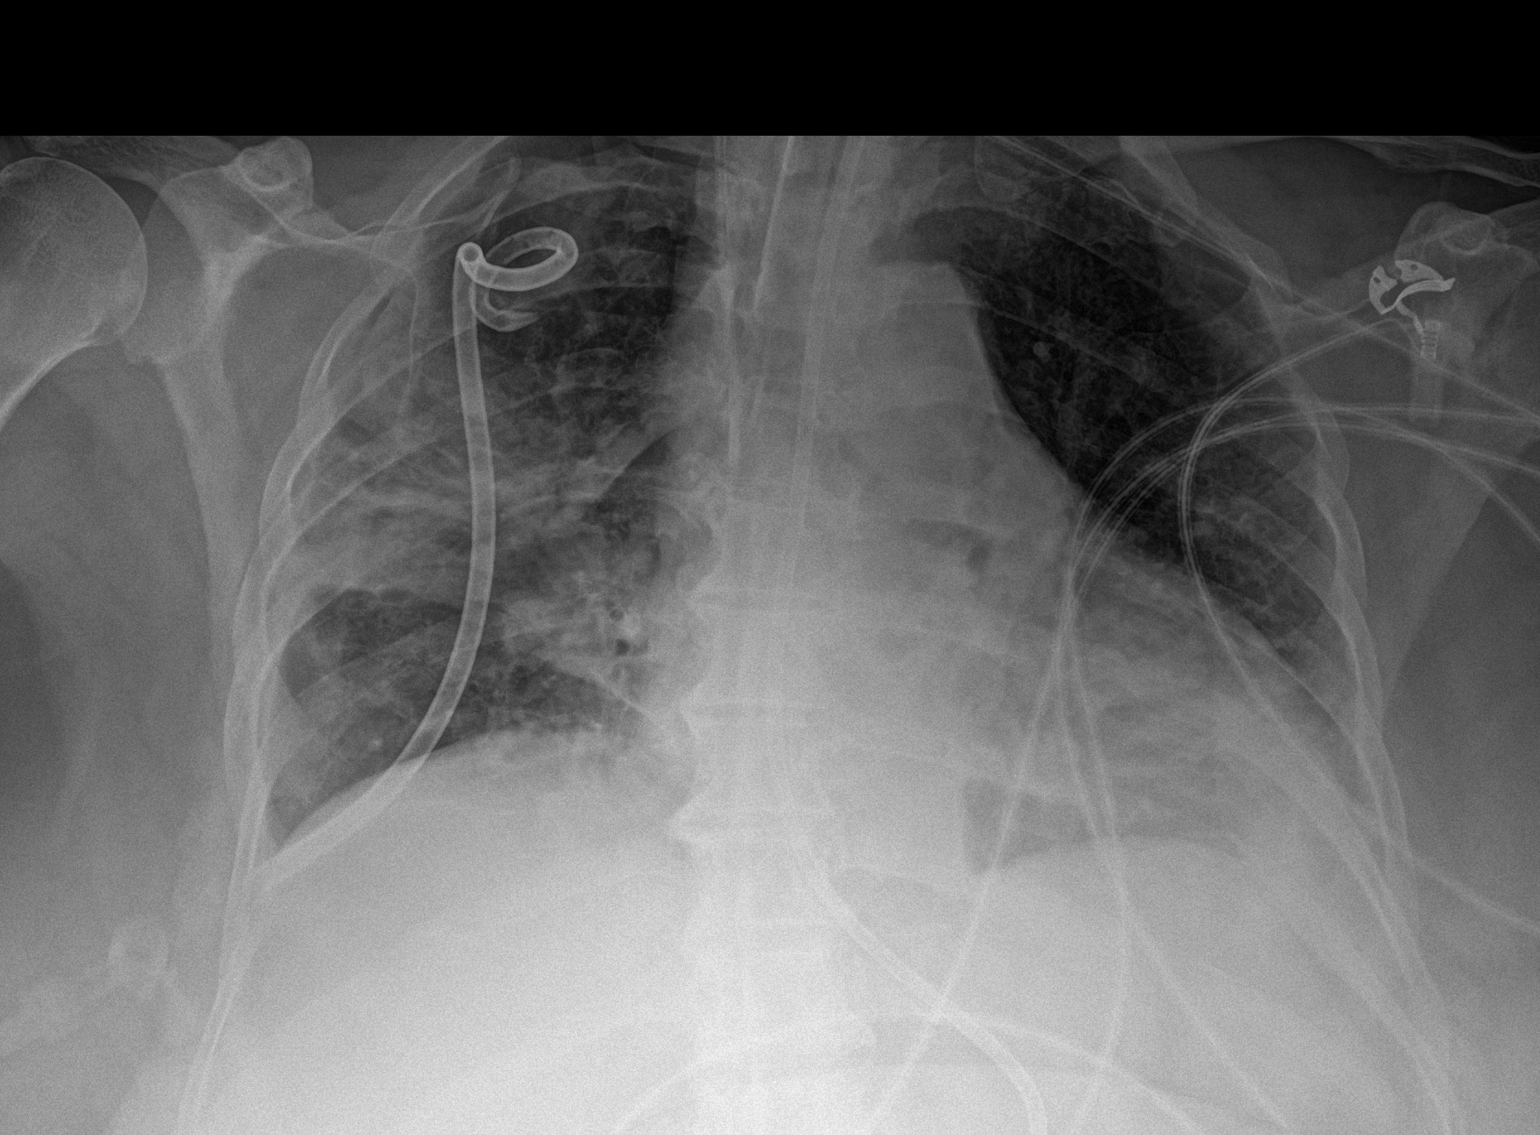

[1 of 1 positions shown; findings below may reference images not displayed]

FINDINGS: Single frontal view of the chest demonstrates repositioning of the
endotracheal tube, tip projecting 2.2 cm above carina. Enteric
catheter, right internal jugular catheter, and right-sided chest
tube are unchanged. Consolidation within the right mid and left
lower lung zones again noted and stable. Small left effusion is
suspected. There is a trace right apical pneumothorax unchanged
since prior exam.
IMPRESSION: 1. Repositioning of endotracheal tube, now well above carina.
2. Otherwise stable support devices.
3. Stable trace right apical pneumothorax.
4. Stable bilateral airspace disease.

## 2021-01-17 IMAGING — DX DG CHEST 1V PORT
1 series · 1 of 1 positions shown · non-contrast
Comparison: 02/19/2020

CLINICAL DATA: Stroke. Endotracheally intubated.

EXAM:
PORTABLE CHEST 1 VIEW

[chest ap]
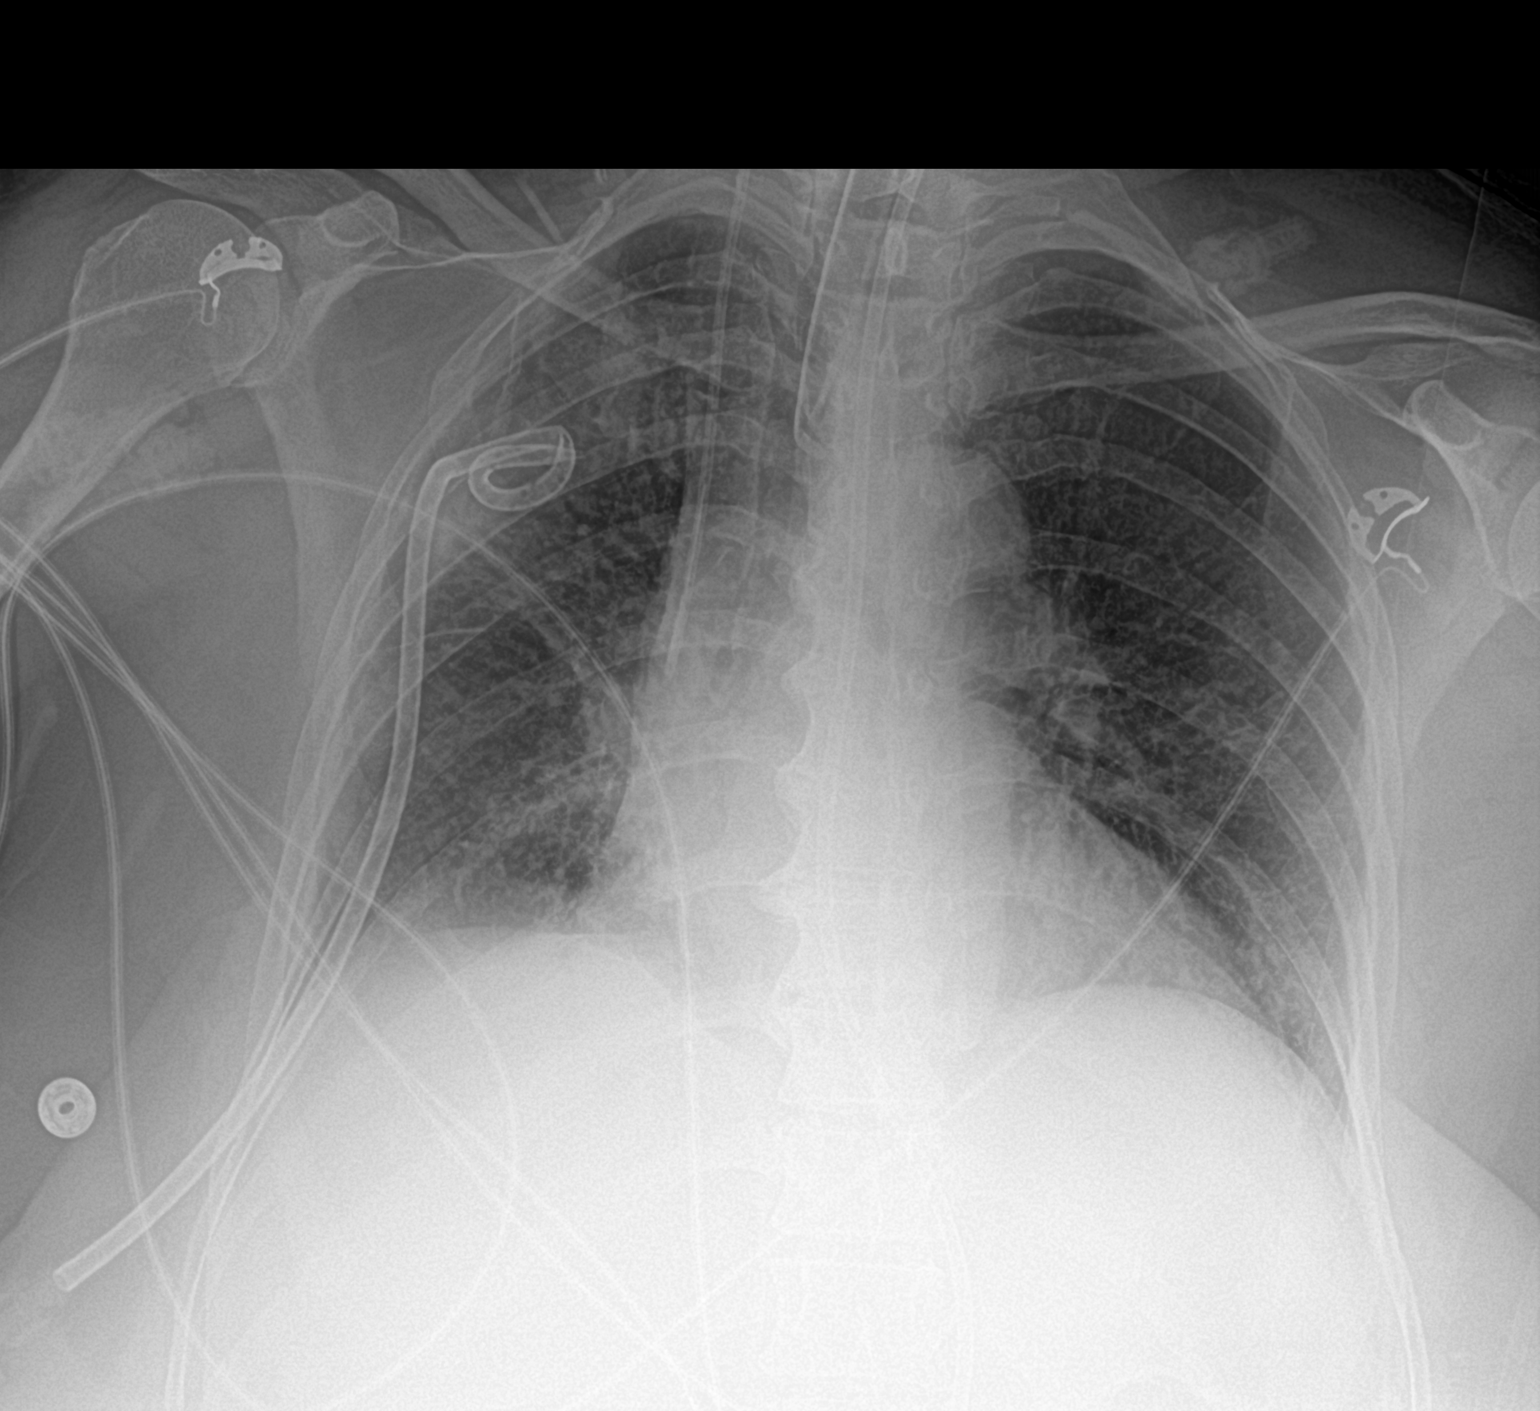

[1 of 1 positions shown; findings below may reference images not displayed]

FINDINGS: Endotracheal tube, feeding tube, and right jugular central venous
catheter remain in appropriate position. A right pleural pigtail
catheter also remains in place. No pneumothorax or significant
pleural effusion visualized.

Previously seen infiltrates in the right upper lobe and left lower
lung show improvement since prior study. No new or worsening areas
of opacity are seen. Heart size is stable.
IMPRESSION: Improving right upper lobe and left lower lung infiltrates.
